# Patient Record
Sex: Female | Born: 1970 | Hispanic: Yes | Marital: Married | Smoking: Never smoker
Health system: Southern US, Community
[De-identification: ages and names within clinical notes are randomized; demographics above are authoritative.]

## PROBLEM LIST (undated history)

## (undated) DIAGNOSIS — M329 Systemic lupus erythematosus, unspecified: Secondary | ICD-10-CM

## (undated) DIAGNOSIS — R030 Elevated blood-pressure reading, without diagnosis of hypertension: Secondary | ICD-10-CM

## (undated) DIAGNOSIS — K589 Irritable bowel syndrome without diarrhea: Secondary | ICD-10-CM

## (undated) DIAGNOSIS — D72819 Decreased white blood cell count, unspecified: Secondary | ICD-10-CM

## (undated) DIAGNOSIS — G40909 Epilepsy, unspecified, not intractable, without status epilepticus: Secondary | ICD-10-CM

## (undated) DIAGNOSIS — G43509 Persistent migraine aura without cerebral infarction, not intractable, without status migrainosus: Secondary | ICD-10-CM

## (undated) DIAGNOSIS — IMO0001 Reserved for inherently not codable concepts without codable children: Secondary | ICD-10-CM

## (undated) DIAGNOSIS — E785 Hyperlipidemia, unspecified: Secondary | ICD-10-CM

## (undated) HISTORY — DX: Elevated blood-pressure reading, without diagnosis of hypertension: R03.0

## (undated) HISTORY — PX: CHOLECYSTECTOMY: SHX55

## (undated) HISTORY — PX: BREAST SURGERY: SHX581

## (undated) HISTORY — DX: Systemic lupus erythematosus, unspecified: M32.9

## (undated) HISTORY — PX: REDUCTION MAMMAPLASTY: SUR839

## (undated) HISTORY — PX: TONSILLECTOMY: SUR1361

## (undated) HISTORY — DX: Hyperlipidemia, unspecified: E78.5

## (undated) HISTORY — DX: Reserved for inherently not codable concepts without codable children: IMO0001

## (undated) HISTORY — DX: Decreased white blood cell count, unspecified: D72.819

## (undated) HISTORY — DX: Epilepsy, unspecified, not intractable, without status epilepticus: G40.909

## (undated) HISTORY — DX: Irritable bowel syndrome, unspecified: K58.9

## (undated) HISTORY — PX: VAGINAL HYSTERECTOMY: SUR661

## (undated) HISTORY — PX: DILATION AND CURETTAGE OF UTERUS: SHX78

## (undated) HISTORY — DX: Persistent migraine aura without cerebral infarction, not intractable, without status migrainosus: G43.509

---

## 2001-07-24 ENCOUNTER — Inpatient Hospital Stay (HOSPITAL_COMMUNITY): Admission: AD | Admit: 2001-07-24 | Discharge: 2001-07-24 | Payer: Self-pay | Admitting: *Deleted

## 2001-08-07 ENCOUNTER — Encounter (HOSPITAL_COMMUNITY): Admission: RE | Admit: 2001-08-07 | Discharge: 2001-09-06 | Payer: Self-pay | Admitting: *Deleted

## 2001-08-28 ENCOUNTER — Encounter: Payer: Self-pay | Admitting: *Deleted

## 2001-09-11 ENCOUNTER — Encounter (HOSPITAL_COMMUNITY): Admission: RE | Admit: 2001-09-11 | Discharge: 2001-09-13 | Payer: Self-pay | Admitting: *Deleted

## 2001-09-16 ENCOUNTER — Inpatient Hospital Stay (HOSPITAL_COMMUNITY): Admission: AD | Admit: 2001-09-16 | Discharge: 2001-09-18 | Payer: Self-pay | Admitting: *Deleted

## 2002-06-18 ENCOUNTER — Inpatient Hospital Stay (HOSPITAL_COMMUNITY): Admission: RE | Admit: 2002-06-18 | Discharge: 2002-06-19 | Payer: Self-pay | Admitting: Obstetrics and Gynecology

## 2003-07-28 ENCOUNTER — Inpatient Hospital Stay (HOSPITAL_COMMUNITY): Admission: AD | Admit: 2003-07-28 | Discharge: 2003-07-30 | Payer: Self-pay | Admitting: Gynecology

## 2003-09-12 ENCOUNTER — Other Ambulatory Visit: Admission: RE | Admit: 2003-09-12 | Discharge: 2003-09-12 | Payer: Self-pay | Admitting: Gynecology

## 2003-12-19 ENCOUNTER — Other Ambulatory Visit: Admission: RE | Admit: 2003-12-19 | Discharge: 2003-12-19 | Payer: Self-pay | Admitting: Gynecology

## 2004-05-12 ENCOUNTER — Encounter: Admission: RE | Admit: 2004-05-12 | Discharge: 2004-05-12 | Payer: Self-pay | Admitting: Gastroenterology

## 2004-05-20 ENCOUNTER — Encounter (INDEPENDENT_AMBULATORY_CARE_PROVIDER_SITE_OTHER): Payer: Self-pay | Admitting: *Deleted

## 2004-05-20 ENCOUNTER — Inpatient Hospital Stay (HOSPITAL_COMMUNITY): Admission: RE | Admit: 2004-05-20 | Discharge: 2004-05-21 | Payer: Self-pay | Admitting: Gynecology

## 2004-10-06 ENCOUNTER — Ambulatory Visit: Payer: Self-pay | Admitting: General Surgery

## 2004-10-12 ENCOUNTER — Ambulatory Visit: Payer: Self-pay | Admitting: General Surgery

## 2004-10-14 ENCOUNTER — Ambulatory Visit: Payer: Self-pay | Admitting: General Surgery

## 2005-02-07 ENCOUNTER — Other Ambulatory Visit: Admission: RE | Admit: 2005-02-07 | Discharge: 2005-02-07 | Payer: Self-pay | Admitting: Gynecology

## 2005-09-15 ENCOUNTER — Ambulatory Visit: Payer: Self-pay | Admitting: Family Medicine

## 2006-01-27 ENCOUNTER — Ambulatory Visit: Payer: Self-pay | Admitting: Internal Medicine

## 2006-02-10 ENCOUNTER — Other Ambulatory Visit: Admission: RE | Admit: 2006-02-10 | Discharge: 2006-02-10 | Payer: Self-pay | Admitting: Gynecology

## 2006-05-08 ENCOUNTER — Ambulatory Visit: Payer: Self-pay | Admitting: Internal Medicine

## 2006-05-29 ENCOUNTER — Ambulatory Visit: Payer: Self-pay | Admitting: Internal Medicine

## 2006-06-15 ENCOUNTER — Ambulatory Visit: Payer: Self-pay | Admitting: Internal Medicine

## 2006-06-28 ENCOUNTER — Ambulatory Visit (HOSPITAL_COMMUNITY): Admission: RE | Admit: 2006-06-28 | Discharge: 2006-06-28 | Payer: Self-pay | Admitting: Internal Medicine

## 2006-06-28 ENCOUNTER — Encounter: Payer: Self-pay | Admitting: Internal Medicine

## 2006-06-28 LAB — HM SIGMOIDOSCOPY

## 2006-06-29 ENCOUNTER — Ambulatory Visit: Payer: Self-pay | Admitting: Internal Medicine

## 2006-07-13 ENCOUNTER — Other Ambulatory Visit: Admission: RE | Admit: 2006-07-13 | Discharge: 2006-07-13 | Payer: Self-pay | Admitting: Gynecology

## 2006-07-17 ENCOUNTER — Encounter: Admission: RE | Admit: 2006-07-17 | Discharge: 2006-07-17 | Payer: Self-pay | Admitting: Gynecology

## 2006-07-24 ENCOUNTER — Ambulatory Visit: Payer: Self-pay | Admitting: Internal Medicine

## 2007-02-12 ENCOUNTER — Other Ambulatory Visit: Admission: RE | Admit: 2007-02-12 | Discharge: 2007-02-12 | Payer: Self-pay | Admitting: Gynecology

## 2007-02-20 ENCOUNTER — Ambulatory Visit: Payer: Self-pay | Admitting: Internal Medicine

## 2007-03-01 ENCOUNTER — Ambulatory Visit: Payer: Self-pay | Admitting: Internal Medicine

## 2007-03-08 DIAGNOSIS — K589 Irritable bowel syndrome without diarrhea: Secondary | ICD-10-CM

## 2007-04-05 ENCOUNTER — Ambulatory Visit: Payer: Self-pay | Admitting: Internal Medicine

## 2007-05-06 ENCOUNTER — Emergency Department (HOSPITAL_COMMUNITY): Admission: EM | Admit: 2007-05-06 | Discharge: 2007-05-06 | Payer: Self-pay | Admitting: Emergency Medicine

## 2007-05-22 ENCOUNTER — Ambulatory Visit: Payer: Self-pay | Admitting: Internal Medicine

## 2007-05-22 LAB — CONVERTED CEMR LAB
Albumin: 3.9 g/dL (ref 3.5–5.2)
Basophils Relative: 0.2 % (ref 0.0–1.0)
Hemoglobin: 12.8 g/dL (ref 12.0–15.0)
Lymphocytes Relative: 29.7 % (ref 12.0–46.0)
MCV: 92.9 fL (ref 78.0–100.0)
Monocytes Absolute: 0.4 10*3/uL (ref 0.2–0.7)
Monocytes Relative: 10.4 % (ref 3.0–11.0)
Neutro Abs: 2.6 10*3/uL (ref 1.4–7.7)
Total Bilirubin: 0.5 mg/dL (ref 0.3–1.2)
Total Protein: 7 g/dL (ref 6.0–8.3)

## 2007-05-23 ENCOUNTER — Ambulatory Visit (HOSPITAL_COMMUNITY): Admission: RE | Admit: 2007-05-23 | Discharge: 2007-05-23 | Payer: Self-pay | Admitting: Internal Medicine

## 2007-07-09 ENCOUNTER — Ambulatory Visit: Payer: Self-pay | Admitting: Internal Medicine

## 2007-07-09 LAB — CONVERTED CEMR LAB
Eosinophils Relative: 0.2 % (ref 0.0–5.0)
HCT: 37.4 % (ref 36.0–46.0)
MCV: 93.5 fL (ref 78.0–100.0)
Neutrophils Relative %: 64.9 % (ref 43.0–77.0)
RBC: 4 M/uL (ref 3.87–5.11)
RDW: 12.4 % (ref 11.5–14.6)
WBC: 3.9 10*3/uL — ABNORMAL LOW (ref 4.5–10.5)

## 2007-07-12 ENCOUNTER — Ambulatory Visit: Payer: Self-pay | Admitting: Oncology

## 2007-07-31 ENCOUNTER — Encounter: Payer: Self-pay | Admitting: Internal Medicine

## 2007-07-31 LAB — COMPREHENSIVE METABOLIC PANEL
ALT: 38 U/L — ABNORMAL HIGH (ref 0–35)
AST: 24 U/L (ref 0–37)
Albumin: 4.2 g/dL (ref 3.5–5.2)
Alkaline Phosphatase: 44 U/L (ref 39–117)
BUN: 13 mg/dL (ref 6–23)
CO2: 23 mEq/L (ref 19–32)
Calcium: 8.8 mg/dL (ref 8.4–10.5)
Chloride: 107 mEq/L (ref 96–112)
Creatinine, Ser: 0.57 mg/dL (ref 0.40–1.20)
Glucose, Bld: 80 mg/dL (ref 70–99)
Potassium: 4.1 mEq/L (ref 3.5–5.3)
Sodium: 141 mEq/L (ref 135–145)
Total Bilirubin: 0.4 mg/dL (ref 0.3–1.2)
Total Protein: 6.8 g/dL (ref 6.0–8.3)

## 2007-07-31 LAB — CBC WITH DIFFERENTIAL/PLATELET
Basophils Absolute: 0 10*3/uL (ref 0.0–0.1)
Eosinophils Absolute: 0 10*3/uL (ref 0.0–0.5)
HCT: 35 % (ref 34.8–46.6)
HGB: 12.3 g/dL (ref 11.6–15.9)
MCH: 32.5 pg (ref 26.0–34.0)
MCV: 92.2 fL (ref 81.0–101.0)
MONO%: 9.2 % (ref 0.0–13.0)
NEUT#: 2.3 10*3/uL (ref 1.5–6.5)
NEUT%: 66.2 % (ref 39.6–76.8)
RDW: 12.8 % (ref 11.3–14.5)
lymph#: 0.8 10*3/uL — ABNORMAL LOW (ref 0.9–3.3)

## 2007-07-31 LAB — PROTHROMBIN TIME: Prothrombin Time: 13 seconds (ref 11.6–15.2)

## 2007-07-31 LAB — ERYTHROCYTE SEDIMENTATION RATE: Sed Rate: 9 mm/hr (ref 0–30)

## 2007-07-31 LAB — APTT: aPTT: 28 seconds (ref 24–37)

## 2007-08-01 LAB — HIV ANTIBODY (ROUTINE TESTING W REFLEX)

## 2007-08-01 LAB — HEPATITIS C ANTIBODY: HCV Ab: NEGATIVE

## 2007-08-01 LAB — ANA: Anti Nuclear Antibody(ANA): NEGATIVE

## 2007-08-01 LAB — HEPATITIS B SURFACE ANTIBODY,QUALITATIVE: Hep B S Ab: NEGATIVE

## 2007-08-24 ENCOUNTER — Encounter: Payer: Self-pay | Admitting: Internal Medicine

## 2008-01-14 ENCOUNTER — Ambulatory Visit: Payer: Self-pay | Admitting: Oncology

## 2008-01-16 ENCOUNTER — Encounter: Payer: Self-pay | Admitting: Internal Medicine

## 2008-01-16 LAB — CBC WITH DIFFERENTIAL/PLATELET
BASO%: 1.1 % (ref 0.0–2.0)
Basophils Absolute: 0 10*3/uL (ref 0.0–0.1)
EOS%: 0.6 % (ref 0.0–7.0)
HCT: 38.9 % (ref 34.8–46.6)
HGB: 13.6 g/dL (ref 11.6–15.9)
MCH: 32.4 pg (ref 26.0–34.0)
MCHC: 34.9 g/dL (ref 32.0–36.0)
MCV: 92.8 fL (ref 81.0–101.0)
MONO%: 7.7 % (ref 0.0–13.0)
NEUT%: 60.6 % (ref 39.6–76.8)
lymph#: 1.2 10*3/uL (ref 0.9–3.3)

## 2008-01-16 LAB — COMPREHENSIVE METABOLIC PANEL
AST: 22 U/L (ref 0–37)
Albumin: 4.6 g/dL (ref 3.5–5.2)
Alkaline Phosphatase: 47 U/L (ref 39–117)
BUN: 14 mg/dL (ref 6–23)
Creatinine, Ser: 0.67 mg/dL (ref 0.40–1.20)
Glucose, Bld: 82 mg/dL (ref 70–99)
Potassium: 3.9 mEq/L (ref 3.5–5.3)

## 2008-01-24 DIAGNOSIS — D72819 Decreased white blood cell count, unspecified: Secondary | ICD-10-CM | POA: Insufficient documentation

## 2008-01-24 DIAGNOSIS — D696 Thrombocytopenia, unspecified: Secondary | ICD-10-CM | POA: Insufficient documentation

## 2008-01-24 DIAGNOSIS — E7800 Pure hypercholesterolemia, unspecified: Secondary | ICD-10-CM

## 2008-03-17 ENCOUNTER — Other Ambulatory Visit: Admission: RE | Admit: 2008-03-17 | Discharge: 2008-03-17 | Payer: Self-pay | Admitting: Gynecology

## 2008-03-21 ENCOUNTER — Encounter: Admission: RE | Admit: 2008-03-21 | Discharge: 2008-03-21 | Payer: Self-pay | Admitting: Gynecology

## 2008-06-15 ENCOUNTER — Ambulatory Visit: Payer: Self-pay | Admitting: Oncology

## 2008-06-18 ENCOUNTER — Ambulatory Visit: Payer: Self-pay | Admitting: Internal Medicine

## 2008-06-20 ENCOUNTER — Encounter: Payer: Self-pay | Admitting: Internal Medicine

## 2008-06-20 LAB — CBC WITH DIFFERENTIAL/PLATELET
BASO%: 0.4 % (ref 0.0–2.0)
EOS%: 0.6 % (ref 0.0–7.0)
MCH: 32.2 pg (ref 26.0–34.0)
MCHC: 34.1 g/dL (ref 32.0–36.0)
MONO#: 0.3 10*3/uL (ref 0.1–0.9)
RBC: 4.23 10*6/uL (ref 3.70–5.32)
RDW: 12.1 % (ref 11.3–14.5)
WBC: 3.9 10*3/uL (ref 3.9–10.0)
lymph#: 1.1 10*3/uL (ref 0.9–3.3)

## 2008-06-20 LAB — COMPREHENSIVE METABOLIC PANEL
ALT: 20 U/L (ref 0–35)
AST: 23 U/L (ref 0–37)
CO2: 24 mEq/L (ref 19–32)
Calcium: 9.3 mg/dL (ref 8.4–10.5)
Chloride: 103 mEq/L (ref 96–112)
Sodium: 138 mEq/L (ref 135–145)
Total Bilirubin: 0.6 mg/dL (ref 0.3–1.2)
Total Protein: 7.4 g/dL (ref 6.0–8.3)

## 2008-06-20 LAB — LACTATE DEHYDROGENASE: LDH: 180 U/L (ref 94–250)

## 2008-07-15 IMAGING — CR DG ABDOMEN ACUTE W/ 1V CHEST
4 series · 4 of 4 positions shown · non-contrast
Comparison: none

CLINICAL DATA: Epigastric pain.  Nausea, vomiting and syncopal episode.   
 ACUTE ABDOMINAL SERIES WITH CHEST - 3 VIEW:

[w chest pa]
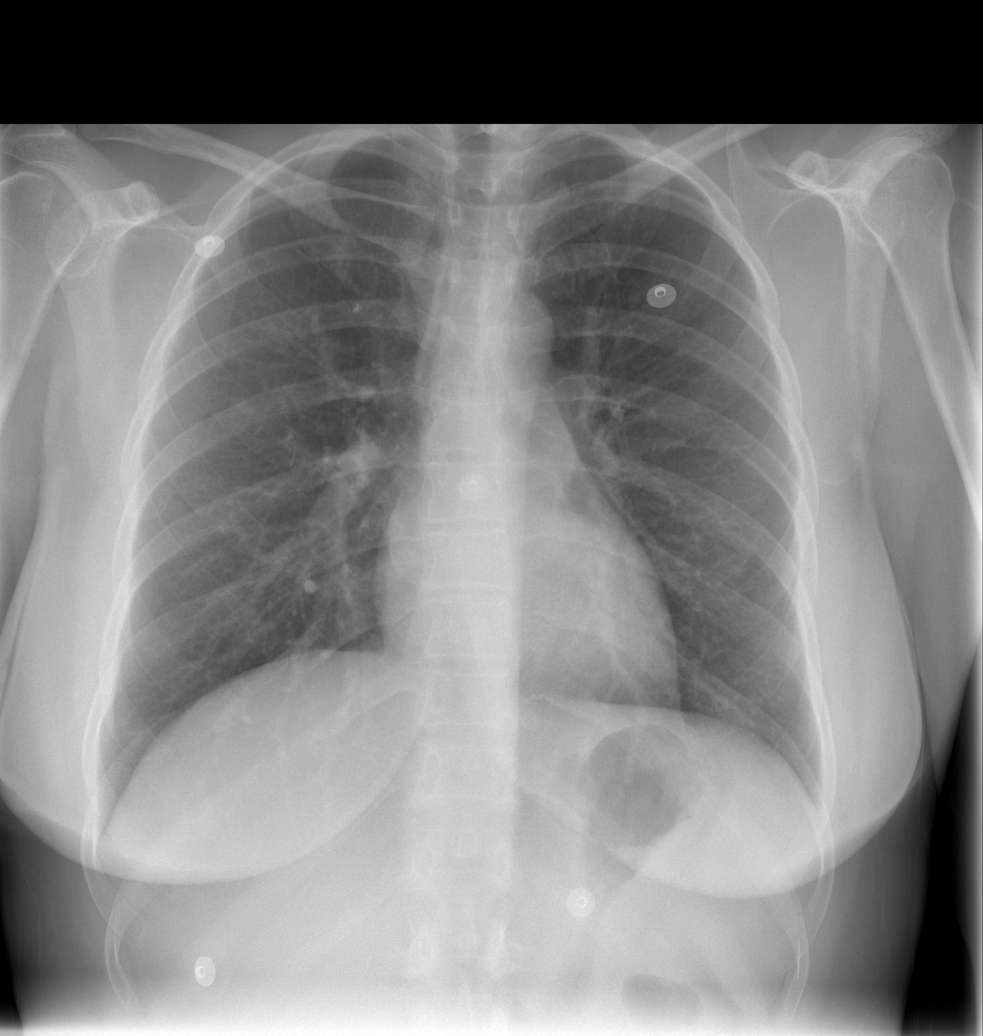

[w abdomen upright *]
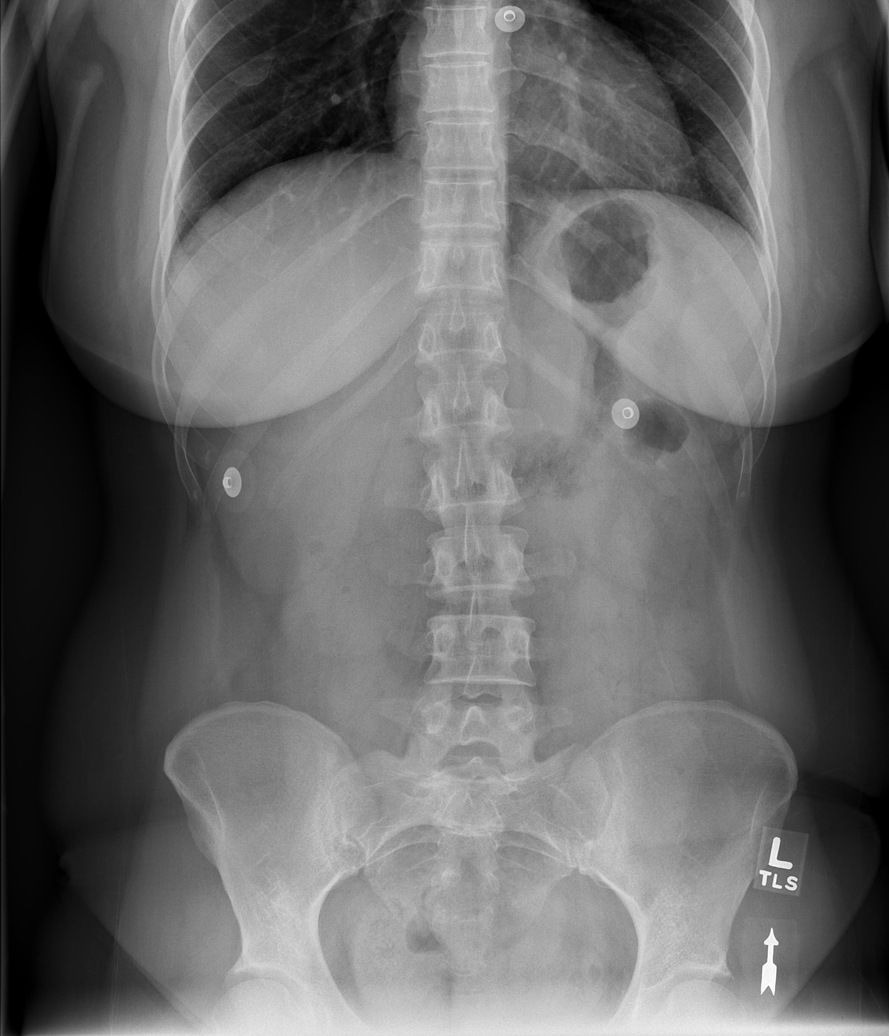

[t abdomen supine (1 of 2)]
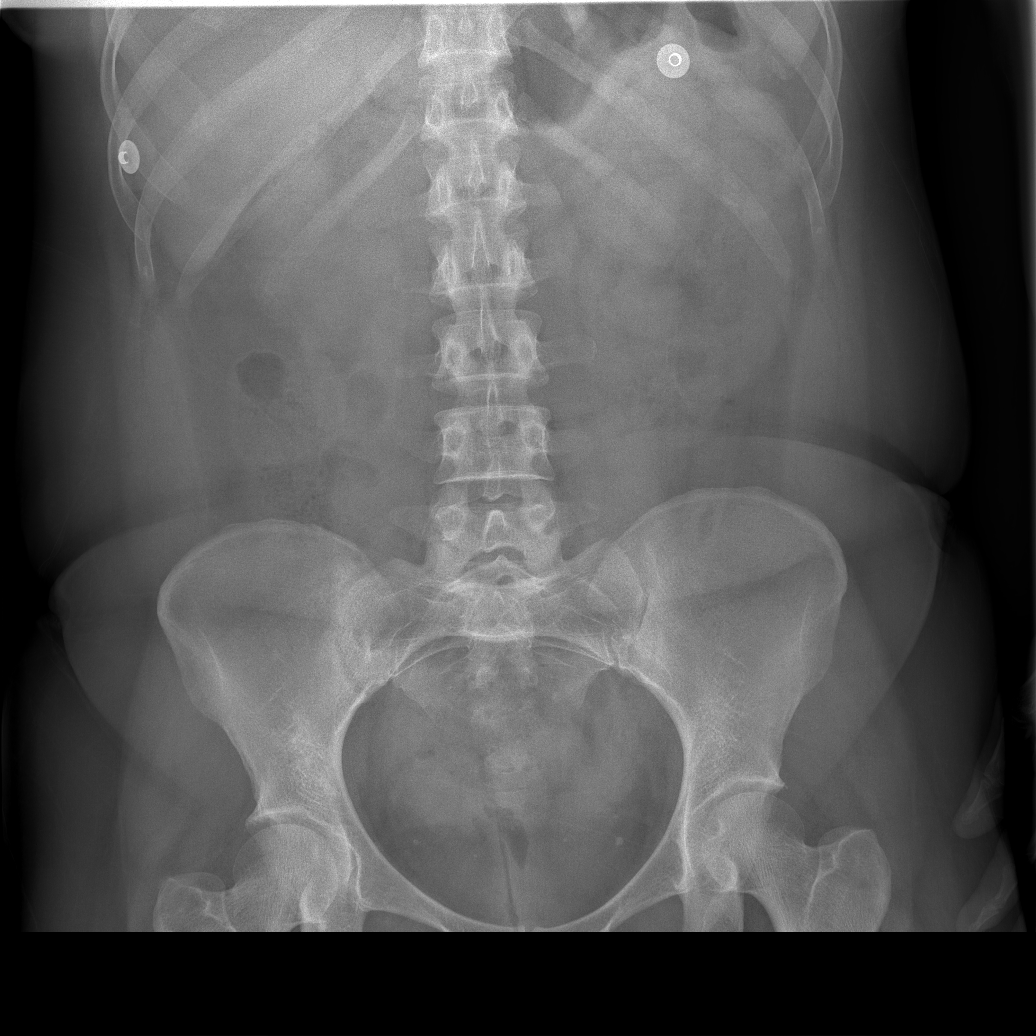

[t abdomen supine (2 of 2)]
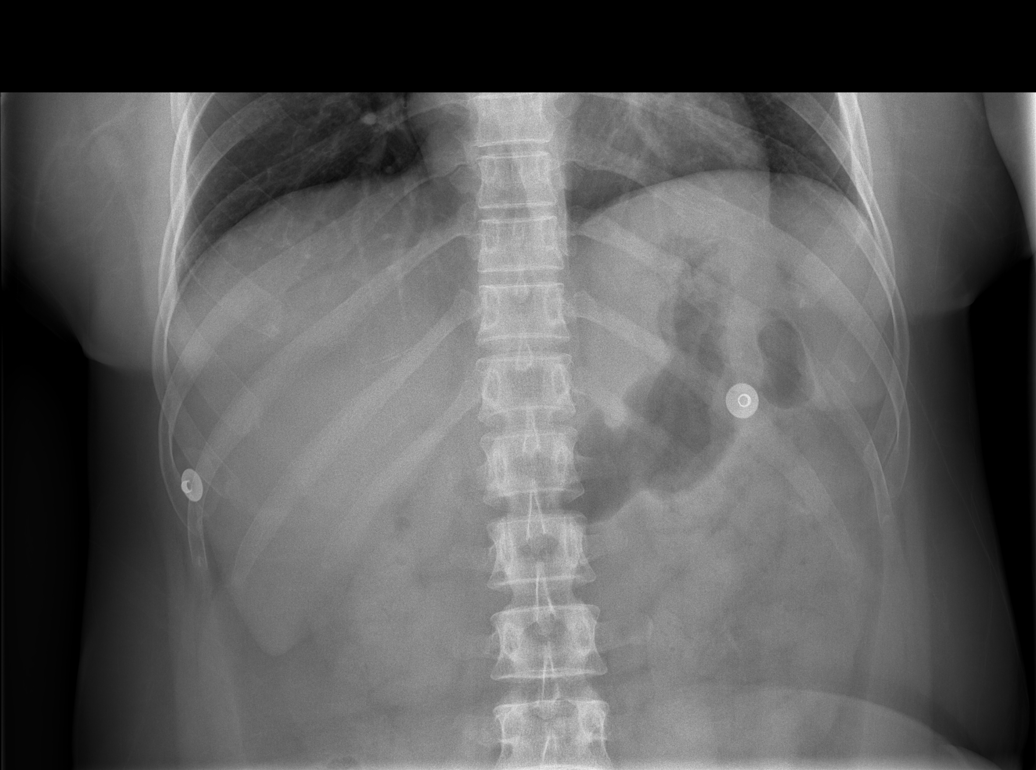

[4 of 4 positions shown; findings below may reference images not displayed]

FINDINGS: Heart and lungs are normal.  There is no free-air or free fluid in the abdomen.  The bowel gas pattern is normal.  No bony abnormality.  There are some phleboliths in the pelvis.
IMPRESSION: Benign-appearing abdomen and pelvis.

## 2008-12-22 ENCOUNTER — Ambulatory Visit: Payer: Self-pay | Admitting: Oncology

## 2008-12-24 ENCOUNTER — Encounter: Payer: Self-pay | Admitting: Internal Medicine

## 2008-12-24 LAB — CBC WITH DIFFERENTIAL/PLATELET
BASO%: 0.3 % (ref 0.0–2.0)
EOS%: 0.2 % (ref 0.0–7.0)
LYMPH%: 24.6 % (ref 14.0–49.7)
MCH: 32.4 pg (ref 25.1–34.0)
MCHC: 34.4 g/dL (ref 31.5–36.0)
MCV: 94.2 fL (ref 79.5–101.0)
MONO#: 0.3 10*3/uL (ref 0.1–0.9)
MONO%: 8.2 % (ref 0.0–14.0)
Platelets: 122 10*3/uL — ABNORMAL LOW (ref 145–400)
RBC: 3.96 10*6/uL (ref 3.70–5.45)
WBC: 4 10*3/uL (ref 3.9–10.3)

## 2009-01-23 ENCOUNTER — Ambulatory Visit: Payer: Self-pay | Admitting: Internal Medicine

## 2009-01-23 LAB — CONVERTED CEMR LAB
ALT: 20 units/L (ref 0–35)
AST: 20 units/L (ref 0–37)
Albumin: 3.8 g/dL (ref 3.5–5.2)
Alkaline Phosphatase: 51 units/L (ref 39–117)
Total Protein: 7 g/dL (ref 6.0–8.3)

## 2009-01-26 ENCOUNTER — Ambulatory Visit: Payer: Self-pay | Admitting: Internal Medicine

## 2009-01-26 DIAGNOSIS — E669 Obesity, unspecified: Secondary | ICD-10-CM

## 2009-02-09 ENCOUNTER — Telehealth: Payer: Self-pay | Admitting: Internal Medicine

## 2009-03-16 ENCOUNTER — Ambulatory Visit: Payer: Self-pay | Admitting: Internal Medicine

## 2009-03-20 ENCOUNTER — Encounter: Payer: Self-pay | Admitting: Gynecology

## 2009-03-20 ENCOUNTER — Ambulatory Visit: Payer: Self-pay | Admitting: Gynecology

## 2009-03-20 ENCOUNTER — Other Ambulatory Visit: Admission: RE | Admit: 2009-03-20 | Discharge: 2009-03-20 | Payer: Self-pay | Admitting: Gynecology

## 2009-06-19 ENCOUNTER — Ambulatory Visit: Payer: Self-pay | Admitting: Oncology

## 2009-06-23 ENCOUNTER — Encounter: Payer: Self-pay | Admitting: Internal Medicine

## 2009-06-23 LAB — COMPREHENSIVE METABOLIC PANEL
ALT: 13 U/L (ref 0–35)
AST: 14 U/L (ref 0–37)
CO2: 25 mEq/L (ref 19–32)
Sodium: 138 mEq/L (ref 135–145)
Total Bilirubin: 0.3 mg/dL (ref 0.3–1.2)
Total Protein: 7.1 g/dL (ref 6.0–8.3)

## 2009-06-23 LAB — CBC WITH DIFFERENTIAL/PLATELET
Eosinophils Absolute: 0 10*3/uL (ref 0.0–0.5)
LYMPH%: 29.6 % (ref 14.0–49.7)
MONO#: 0.4 10*3/uL (ref 0.1–0.9)
NEUT#: 2.7 10*3/uL (ref 1.5–6.5)
Platelets: 131 10*3/uL — ABNORMAL LOW (ref 145–400)
RBC: 4.04 10*6/uL (ref 3.70–5.45)
WBC: 4.4 10*3/uL (ref 3.9–10.3)
lymph#: 1.3 10*3/uL (ref 0.9–3.3)

## 2009-08-31 ENCOUNTER — Ambulatory Visit: Payer: Self-pay | Admitting: Internal Medicine

## 2009-08-31 DIAGNOSIS — G8929 Other chronic pain: Secondary | ICD-10-CM | POA: Insufficient documentation

## 2009-08-31 DIAGNOSIS — R51 Headache: Secondary | ICD-10-CM

## 2009-09-08 ENCOUNTER — Telehealth: Payer: Self-pay | Admitting: Internal Medicine

## 2009-09-14 ENCOUNTER — Encounter (INDEPENDENT_AMBULATORY_CARE_PROVIDER_SITE_OTHER): Payer: Self-pay | Admitting: *Deleted

## 2009-10-22 ENCOUNTER — Encounter: Payer: Self-pay | Admitting: Internal Medicine

## 2009-12-18 ENCOUNTER — Telehealth (INDEPENDENT_AMBULATORY_CARE_PROVIDER_SITE_OTHER): Payer: Self-pay | Admitting: *Deleted

## 2009-12-18 ENCOUNTER — Ambulatory Visit: Payer: Self-pay | Admitting: Internal Medicine

## 2009-12-18 DIAGNOSIS — R030 Elevated blood-pressure reading, without diagnosis of hypertension: Secondary | ICD-10-CM

## 2009-12-21 ENCOUNTER — Telehealth (INDEPENDENT_AMBULATORY_CARE_PROVIDER_SITE_OTHER): Payer: Self-pay | Admitting: *Deleted

## 2009-12-22 ENCOUNTER — Ambulatory Visit: Payer: Self-pay | Admitting: Internal Medicine

## 2009-12-22 ENCOUNTER — Ambulatory Visit: Payer: Self-pay | Admitting: Oncology

## 2009-12-23 LAB — CONVERTED CEMR LAB
BUN: 10 mg/dL (ref 6–23)
Creatinine, Ser: 0.7 mg/dL (ref 0.4–1.2)
GFR calc non Af Amer: 99.85 mL/min (ref >60)
TSH: 1.47 microintl units/mL (ref 0.35–5.50)

## 2009-12-24 ENCOUNTER — Encounter: Payer: Self-pay | Admitting: Internal Medicine

## 2009-12-24 LAB — COMPREHENSIVE METABOLIC PANEL
ALT: 14 U/L (ref 0–35)
AST: 15 U/L (ref 0–37)
Alkaline Phosphatase: 51 U/L (ref 39–117)
BUN: 14 mg/dL (ref 6–23)
Calcium: 9.6 mg/dL (ref 8.4–10.5)
Chloride: 102 mEq/L (ref 96–112)
Creatinine, Ser: 0.7 mg/dL (ref 0.40–1.20)

## 2009-12-24 LAB — CBC WITH DIFFERENTIAL/PLATELET
BASO%: 0.2 % (ref 0.0–2.0)
EOS%: 0.6 % (ref 0.0–7.0)
MCH: 32.3 pg (ref 25.1–34.0)
MCHC: 34.3 g/dL (ref 31.5–36.0)
MCV: 94.3 fL (ref 79.5–101.0)
MONO%: 10.5 % (ref 0.0–14.0)
NEUT%: 63.3 % (ref 38.4–76.8)
RDW: 12.4 % (ref 11.2–14.5)
lymph#: 1 10*3/uL (ref 0.9–3.3)

## 2010-03-22 ENCOUNTER — Ambulatory Visit: Payer: Self-pay | Admitting: Gynecology

## 2010-03-22 ENCOUNTER — Other Ambulatory Visit: Admission: RE | Admit: 2010-03-22 | Discharge: 2010-03-22 | Payer: Self-pay | Admitting: Gynecology

## 2010-04-16 ENCOUNTER — Ambulatory Visit: Payer: Self-pay | Admitting: Internal Medicine

## 2010-05-11 ENCOUNTER — Telehealth (INDEPENDENT_AMBULATORY_CARE_PROVIDER_SITE_OTHER): Payer: Self-pay | Admitting: *Deleted

## 2010-05-11 ENCOUNTER — Ambulatory Visit: Payer: Self-pay | Admitting: Internal Medicine

## 2010-06-17 ENCOUNTER — Telehealth: Payer: Self-pay | Admitting: Internal Medicine

## 2010-06-23 ENCOUNTER — Ambulatory Visit: Payer: Self-pay | Admitting: Internal Medicine

## 2010-07-14 ENCOUNTER — Encounter: Payer: Self-pay | Admitting: Internal Medicine

## 2010-08-03 ENCOUNTER — Ambulatory Visit: Payer: Self-pay | Admitting: Internal Medicine

## 2010-10-14 ENCOUNTER — Encounter: Payer: Self-pay | Admitting: Internal Medicine

## 2010-11-15 ENCOUNTER — Ambulatory Visit
Admission: RE | Admit: 2010-11-15 | Discharge: 2010-11-15 | Payer: Self-pay | Source: Home / Self Care | Attending: Gynecology | Admitting: Gynecology

## 2010-11-18 ENCOUNTER — Encounter
Admission: RE | Admit: 2010-11-18 | Discharge: 2010-11-18 | Payer: Self-pay | Source: Home / Self Care | Attending: Gynecology | Admitting: Gynecology

## 2010-11-21 ENCOUNTER — Encounter: Payer: Self-pay | Admitting: Internal Medicine

## 2010-11-23 ENCOUNTER — Other Ambulatory Visit: Payer: Self-pay | Admitting: Internal Medicine

## 2010-11-23 ENCOUNTER — Ambulatory Visit
Admission: RE | Admit: 2010-11-23 | Discharge: 2010-11-23 | Payer: Self-pay | Source: Home / Self Care | Attending: Internal Medicine | Admitting: Internal Medicine

## 2010-11-23 DIAGNOSIS — R42 Dizziness and giddiness: Secondary | ICD-10-CM | POA: Insufficient documentation

## 2010-11-23 DIAGNOSIS — J029 Acute pharyngitis, unspecified: Secondary | ICD-10-CM | POA: Insufficient documentation

## 2010-11-23 DIAGNOSIS — R5381 Other malaise: Secondary | ICD-10-CM | POA: Insufficient documentation

## 2010-11-23 DIAGNOSIS — R5383 Other fatigue: Secondary | ICD-10-CM

## 2010-11-23 LAB — HEPATIC FUNCTION PANEL
Albumin: 4.3 g/dL (ref 3.5–5.2)
Alkaline Phosphatase: 49 U/L (ref 39–117)
Bilirubin, Direct: 0 mg/dL (ref 0.0–0.3)
Total Bilirubin: 0.6 mg/dL (ref 0.3–1.2)

## 2010-11-23 LAB — CBC WITH DIFFERENTIAL/PLATELET
Basophils Absolute: 0 10*3/uL (ref 0.0–0.1)
Lymphocytes Relative: 16 % (ref 12.0–46.0)
Monocytes Relative: 5.8 % (ref 3.0–12.0)
Neutrophils Relative %: 77.9 % — ABNORMAL HIGH (ref 43.0–77.0)
Platelets: 113 10*3/uL — ABNORMAL LOW (ref 150.0–400.0)
RDW: 12.9 % (ref 11.5–14.6)

## 2010-11-23 LAB — BASIC METABOLIC PANEL
BUN: 16 mg/dL (ref 6–23)
Calcium: 9.4 mg/dL (ref 8.4–10.5)
GFR: 94.66 mL/min (ref >60.00)
Glucose, Bld: 76 mg/dL (ref 70–99)

## 2010-11-23 LAB — SEDIMENTATION RATE: Sed Rate: 41 mm/hr — ABNORMAL HIGH (ref 0–22)

## 2010-11-23 LAB — MONONUCLEOSIS SCREEN: Mono Screen: NEGATIVE

## 2010-11-23 LAB — CONVERTED CEMR LAB: Rapid Strep: NEGATIVE

## 2010-11-30 NOTE — Progress Notes (Signed)
Summary: needs ov  Phone Note Outgoing Call Call back at [facility] Phone [phone] Call back at Work Phone [phone]   Summary of Call: Patient was a walk in friday for elevated bp.  Patient was unable to wait for ov.  Please call patient & schedule office visit for this week with Dr. Alanson Aly [facility]  December 21, 2009 8:25 AM     Additional Follow-up for Phone Call Additional follow up Details #2::    PATIENT HAS A APPT ON fEB 22,2011 Follow-up by: Barb Merino,  December 21, 2009 8:41 AM

## 2010-11-30 NOTE — Assessment & Plan Note (Signed)
Summary: ACUTE WALKIN--ADD PER ALIDA///[facility]   Vital Signs:  Patient profile:   40 year old female Height:      57 inches Weight:      147.38 pounds BMI:     32.01 Pulse rate:   84 / minute BP sitting:   130 / 90  Vitals Entered By: Kandice Hams (December 18, 2009 1:00 PM) CC: WALK IN -- pt c/o elevated [facility]101 started feeling  dizzy, [facility] Comments pt left office says felt much better. Could not wait any longer.   History of Present Illness: patient walked in left w/o see me EKG sinus tachy we just had 3 acute patients one needed to be sent  to the ER, we are working in the 2nd possible admission will contact her to  re-schedule [place] E. Kielan Dreisbach MD  December 18, 2009 2:12 PM   Allergies: No Known Drug Allergies   Complete Medication List: 1)  Ibuprofen 600 Mg Tabs (Ibuprofen) .... One by mouth every 6 hours as needed for headache  Other Orders: EKG w/ Interpretation (93000) No Charge Patient Arrived (NCPA0) (NCPA0)

## 2010-11-30 NOTE — Progress Notes (Signed)
Summary: abdominal x-ray of the sinuses   Phone Note From Other Clinic   Summary of Call: Dr Fredrik Cove,  a local dentist, called He found a rounded opacity in the right sinus cavity, he is concerned about that, things he needs a CT of further E. valve Plan: re-schedule CT of the sinuses refer to ENT, DX abnormal x-ray of the sinus  Jose E. Paz MD  June 17, 2010 12:00 PM   Follow-up for Phone Call        Home number temporaily unavailable, left message on work number. Army Fossa CMA  June 17, 2010 12:50 PM  [email or web address] Barban  June 18, 2010 12:56 PM followup on this  next week please Jose E. Paz MD  June 18, 2010 2:10 PM   Additional Follow-up for Phone Call Additional follow up Details #1::        I spoke with pt and she is aware of the ENT referral and CT of sinuses.  Additional Follow-up by: Army Fossa CMA,  June 21, 2010 1:23 PM

## 2010-11-30 NOTE — Assessment & Plan Note (Signed)
Summary: TB TEST///[facility]   Nurse Visit  CC: TB skin test./kb   Allergies: 1)  ! Amoxicillin  Immunizations Administered:  PPD Skin Test:    Vaccine Type: PPD    Site: left forearm    Dose: 0.1 ml    Route: ID    Given by: Lucious Groves CMA    Exp. Date: 09/02/2011    Lot #: [id number]  PPD Results    Date of reading: 08/05/2010    Results: < 5mm    Interpretation: negative  Orders Added: 1)  TB Skin Test [86580] 2)  Admin 1st Vaccine [91478]

## 2010-11-30 NOTE — Consult Note (Signed)
Summary: DX migraine w/ aura, no need for w/u, f/u prn=== Neurologic  [facility]   Imported By: Lanelle Bal 10/30/2009 11:00:43  _____________________________________________________________________  External Attachment:    Type:   Image     Comment:   External Document

## 2010-11-30 NOTE — Assessment & Plan Note (Signed)
Summary: still congested/kn   Vital Signs:  Patient profile:   40 year old female Weight:      146.13 pounds Temp:     99.0 degrees F oral Pulse rate:   113 / minute Pulse rhythm:   regular BP sitting:   122 / 89  (left arm) Cuff size:   regular  Vitals Entered By: Army Fossa CMA (May 11, 2010 11:32 AM) CC: Still congested, no relief. Pressure in ears and head.   History of Present Illness: since her last visit 6-17 she is no better Her main complaint continued to be "pressure" located at the ears, maxillary and frontal sinuses. Headache? Occasionally has actual headache away from the sinus area.  Review of systems No fever No cough Sneezing for the last couple of days with very mild nasal discharge Still feeling imbalance from time to time  Allergies (verified): No Known Drug Allergies  Past History:  Past Medical History: Reviewed history from 04/16/2010 and no changes required. elevated BP without diagnosis of hypertension  2-11 DYSLIPIDEMIA  ABDOMINAL PAIN, CHRONIC -- sees GI IBS LEUKOCYTOPENIA - thrombocytopenia UNSPECIFIED -- sees hematology , Rx observation  IBS   Migraine HAs w/ aura , saw neuro 12-10  Past Surgical History: Reviewed history from 06/18/2008 and no changes required. Hysterectomy, NO oophorectomy Cholecystectomy  Social History: Reviewed history from 12/22/2009 and no changes required. 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  Alcohol Use --- rarely tobacco--no Daily Caffeine Use -2 Illicit Drug Use - no  Review of Systems      See HPI  Physical Exam  General:  alert, well-developed, and well-nourished.   Head:  face symmetric, not tender to palpation Ears:  slightly bulge TMs, no red, no discharge, canal normalR ear normal, L ear normal, and no external deformities.   Nose:  slightly congested Mouth:  no redness or discharge, no postnasal dripping noted Lungs:  normal respiratory  effort, no intercostal retractions, no accessory muscle use, and normal breath sounds.   Neurologic:  speech, gait and motor is intact   Impression & Recommendations:  Problem # 1:  ? of SINUSITIS (ICD-473.9)  I suspect her symptoms are due to sinusitis (ethmoidal?) less likely the pain/pressure due to migraines plan: CT Antibiotics Second round of steroids Her updated medication list for this problem includes:    Nasonex 50 Mcg/act Susp (Mometasone furoate) .Marland Kitchen... 2 estrace on each side of the nose every day    Amoxicillin 500 Mg Tabs (Amoxicillin) .Marland Kitchen... 2 by mouth twice a day  Orders: Radiology Referral (Radiology)  Complete Medication List: 1)  Nasonex 50 Mcg/act Susp (Mometasone furoate) .... 2 estrace on each side of the nose every day 2)  Amoxicillin 500 Mg Tabs (Amoxicillin) .... 2 by mouth twice a day 3)  Prednisone 10 Mg Tabs (Prednisone) .... 5 tablets for 2 days,, 4x2,3x2,2x2,1x2  Patient Instructions: 1)  continue with  Nasonex and Claritin 2)  Prednisone and amoxicillin as prescribed 3)  CT of the sinuses 4)  Come back in 2 weeks Prescriptions: PREDNISONE 10 MG TABS (PREDNISONE) 5 tablets for 2 days,, 4x2,3x2,2x2,1x2  #32 x 0   Entered and Authorized by:   Nolon Rod. Emari Demmer MD   Signed by:   Nolon Rod. Rafi Kenneth MD on 05/11/2010   Method used:   Electronically to        [facility] [address] [place] (retail)       [phone]       [place]  [place], [place]  [address]       Ph: [phone]       Fax: [phone]   RxID:   [id number] AMOXICILLIN 500 MG TABS (AMOXICILLIN) 2 by mouth twice a day  #40 x 0   Entered and Authorized by:   Nolon Rod. Geraldina Parrott MD   Signed by:   Nolon Rod. Jonmichael Beadnell MD on 05/11/2010   Method used:   Electronically to        [facility] [address] [place] (retail)       [phone]       [place], [place]  [address]       Ph: [phone]       Fax: [phone]   RxID:   [id number]

## 2010-11-30 NOTE — Progress Notes (Signed)
Summary: PHONE-BP HIGH  Phone Note Call from Patient   Caller: Patient Summary of Call: PATIENT IS HERE. PATIENT WENT TO THE PHARMACY AND CHECKED HER BP AND BLOOD PRESSURE IS HIGH 150/101. PLEASE ADVISE Initial call taken by: Barb Merino,  December 18, 2009 12:28 PM  Follow-up for Phone Call        ov Follow-up by: Kandice Hams,  December 18, 2009 1:00 PM

## 2010-11-30 NOTE — Progress Notes (Signed)
Summary: allergic reaction needs med change  Phone Note Call from Patient Call back at Home Phone [phone]   Caller: Patient Details for Reason: ALLERGIC REACTION TO MED Summary of Call: Patient called says she took 2 pills of amoxicillin and developed redness in face and eyes along w/ rash feels like she is burning, doesn't have any swelling or SOB. Instructed patient to take some benadryl and not to take anymore of medication will forward to Dr. Drue Novel to change antibiotic . Initial call taken by: Doristine Devoid CMA,  May 11, 2010 4:17 PM  Follow-up for Phone Call        switch to Bactrim DS one p.o. b.i.d. for 10 days.  while on Bactrim, she needs to avoid excessive sun exposure Jose E. Paz MD  May 11, 2010 4:32 PM   spoke w/ patient aware of change in medication informed to no more amoxicillin and call if any concerns..........Marland KitchenDoristine Devoid CMA  May 11, 2010 4:40 PM    New Allergies: ! AMOXICILLIN New/Updated Medications: BACTRIM DS 800-160 MG TABS (SULFAMETHOXAZOLE-TRIMETHOPRIM) take one tablet two times a day x10 days New Allergies: ! AMOXICILLINPrescriptions: BACTRIM DS 800-160 MG TABS (SULFAMETHOXAZOLE-TRIMETHOPRIM) take one tablet two times a day x10 days  #20 x 0   Entered by:   Doristine Devoid CMA   Authorized by:   Nolon Rod. Paz MD   Signed by:   Doristine Devoid CMA on 05/11/2010   Method used:   Electronically to        [facility] [address] [place] (retail)       [phone]       [place], [place]  [address]       Ph: [phone]       Fax: [phone]   RxID:   [id number]

## 2010-11-30 NOTE — Assessment & Plan Note (Signed)
Summary: congested/cbs   Vital Signs:  Patient profile:   40 year old female Height:      57 inches Weight:      144 pounds Temp:     98.3 degrees F oral Pulse rate:   90 / minute BP sitting:   90 / 62  (left arm)  Vitals Entered By: Jeremy Johann CMA (April 16, 2010 8:11 AM) CC: pressure in earsx48month Comments REVIEWED MED LIST, PATIENT AGREED DOSE AND INSTRUCTION CORRECT    History of Present Illness: complains of pressure in both ears for a few months Also she feels dizzy, "imbalance" sometimes  Just saw her gynecologist, she finished a 10 day of antibiotics for possible sinusitis.  ROS No fever No cough No recent URI, runny nose, sore throat. Some postnasal dripping She does have itchy eyes and nose She has a history of migraine headaches but that has been inactive for a while  Allergies (verified): No Known Drug Allergies  Past History:  Past Medical History: elevated BP without diagnosis of hypertension  2-11 DYSLIPIDEMIA  ABDOMINAL PAIN, CHRONIC -- sees GI IBS LEUKOCYTOPENIA - thrombocytopenia UNSPECIFIED -- sees hematology , Rx observation  IBS   Migraine HAs w/ aura , saw neuro 12-10  Past Surgical History: Reviewed history from 06/18/2008 and no changes required. Hysterectomy, NO oophorectomy Cholecystectomy  Social History: Reviewed history from 12/22/2009 and no changes required. 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  Alcohol Use --- rarely tobacco--no Daily Caffeine Use -2 Illicit Drug Use - no  Review of Systems      See HPI  Physical Exam  General:  alert, well-developed, and well-nourished.   Head:  face symmetric not tender to palpation Ears:  slightly bulge TMs, no red, no discharge, canal normal Nose:  not congested Mouth:  no redness Lungs:  normal respiratory effort, no intercostal retractions, no accessory muscle use, and normal breath sounds.   Heart:  normal rate, regular rhythm, and no  murmur.     Impression & Recommendations:  Problem # 1:  OTITIS MEDIA, SEROUS (ICD-381.4) suspect that the ear pressure he is due to serous otitis and allergies Plan, see instructions  Complete Medication List: 1)  Loratadine 10 Mg Tabs (Loratadine) .... One by mouth daily, over-the-counter 2)  Prednisone 10 Mg Tabs (Prednisone) .... 4 tablets by mouth for 2 days, 3x2, 2x2, 1x2 3)  Nasonex 50 Mcg/act Susp (Mometasone furoate) .... 2 estrace on each side of the nose every day  Patient Instructions: 1)  Claritin over-the-counter every day 2)  Nasonex 2 sprays on each side of the nose daily 3)  prednisone by mouth as prescribed 4)  Call me if not better in a few weeks Prescriptions: PREDNISONE 10 MG TABS (PREDNISONE) 4 tablets by mouth for 2 days, 3x2, 2x2, 1x2  #20 x 0   Entered and Authorized by:   Jose E. Paz MD   Signed by:   Nolon Rod. Paz MD on 04/16/2010   Method used:   Electronically to        [facility] [address] [place] (retail)       [phone]       [place], [place]  [address]       Ph: [phone]       Fax: [phone]   RxID:   [id number] NASONEX 50 MCG/ACT SUSP (MOMETASONE FUROATE) 2 Estrace on each side of the nose every day  #1 x 12  Entered and Authorized by:   Nolon Rod. Paz MD   Signed by:   Nolon Rod. Paz MD on 04/16/2010   Method used:   Print then Give to Patient   RxID:   [id number]

## 2010-11-30 NOTE — Assessment & Plan Note (Signed)
Summary: ELEVATED BP/KDC   Vital Signs:  Patient profile:   40 year old female Height:      57 inches Weight:      146.50 pounds BMI:     31.82 Pulse rate:   84 / minute BP sitting:   110 / 70  Vitals Entered By: Kandice Hams (December 22, 2009 9:01 AM) CC: followup on bp   History of Present Illness: last week, she felt  poorly in general, generalized weakness, no HA  but a funny feeling in her head she went to a local pharmacy her blood pressure was 150/100  review of systems denies chest pain, shortness of breath or lower extremity edema not taking any over-the-counter medications no excessive salt intake denies nosebleeds anxiety  slightly worse than usual  Allergies: No Known Drug Allergies  Past History:  Past Medical History: elevated BP without diagnosis of hypertension  2-11 DYSLIPIDEMIA  ABDOMINAL PAIN, CHRONIC -- sees GI IBS LEUKOCYTOPENIA - thrombocytopenia UNSPECIFIED -- sees hematology , Rx observation  IBS    Past Surgical History: Reviewed history from 06/18/2008 and no changes required. 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  Alcohol Use --- rarely tobacco--no Daily Caffeine Use -2 Illicit Drug Use - no  Review of Systems      See HPI  Physical Exam  General:  alert and well-developed.   Neck:  no masses and no thyromegaly.   Lungs:  normal respiratory effort, no intercostal retractions, no accessory muscle use, and normal breath sounds.   Heart:  normal rate, regular rhythm, and no murmur.   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 One by mouth every 6 hours as needed for headache  Other Orders: Venipuncture (16109) TLB-BMP (Basic Metabolic Panel-BMET) (80048-METABOL) TLB-TSH (Thyroid Stimulating Hormone) (84443-TSH)  Patient Instructions: 1)  Check your blood pressure 2 or 3 times a week. If it is more than 135/80  consistently,please let us know  2)  low salt diet 3)  came back fasting in 3 months for a physical

## 2010-11-30 NOTE — Letter (Signed)
[facility]   Imported By: Lanelle Bal 01/08/2010 09:26:59  _____________________________________________________________________  External Attachment:    Type:   Image     Comment:   External Document

## 2010-11-30 NOTE — Letter (Signed)
Summary: HA, started topamax----[facility]  [facility]   Imported By: Lanelle Bal 07/26/2010 10:01:57  _____________________________________________________________________  External Attachment:    Type:   Image     Comment:   External Document

## 2010-12-01 ENCOUNTER — Ambulatory Visit (INDEPENDENT_AMBULATORY_CARE_PROVIDER_SITE_OTHER): Payer: Managed Care, Other (non HMO) | Admitting: Family Medicine

## 2010-12-01 ENCOUNTER — Encounter: Payer: Self-pay | Admitting: Family Medicine

## 2010-12-01 DIAGNOSIS — J309 Allergic rhinitis, unspecified: Secondary | ICD-10-CM | POA: Insufficient documentation

## 2010-12-02 NOTE — Letter (Signed)
Summary: Ear tubes, ETD?----ENT  [facility]   Imported By: Lanelle Bal 10/28/2010 10:59:43  _____________________________________________________________________  External Attachment:    Type:   Image     Comment:   External Document

## 2010-12-02 NOTE — Assessment & Plan Note (Signed)
Summary: sore throat/kn   Vital Signs:  Patient profile:   40 year old female Height:      57 inches Weight:      149.38 pounds BMI:     32.44 Temp:     97.6 degrees F Pulse rate:   80 / minute Pulse rhythm:   regular BP sitting:   128 / 84  (left arm) Cuff size:   regular  Vitals Entered By: Army Fossa CMA (November 23, 2010 10:32 AM) CC: Pt here c/o feeling weak, and sore throat  Comments [facility] [address]    History of Present Illness:  Continue with multiple symptoms since 2010 Pressure in the ears, dizziness, headaches. So far, he has seen neurology and ENT. Neurology diagnosed her with migraine headaches and prescribed Topamax (07-2010) but the patient declines to take it because "is a medicine  for seizures". she saw another neurologist on followup after the initial visit and she had no other suggestions except Topamax (per patient) ENT offer her ear tubes but she so far has declined to go forward as well. CT of the sinuses 8-11 essentially negative the patient has taken her BP whenever she feels pressure in the ears and dizziness at the BP is usually normal or low. she also complained of extreme fatigue,described as generalized lack of energy   ROS Headaches are worse, since January 1st  she had 4 episodes both moderate headaches along with nausea. No vomiting or diarrhea She has some degree of anxiety but  at this point she said  that she is better. She also has some depression, no worse than before. does not know exactly why she feels that way. Things are going okay at home and at work. no arthralgias or myalgias no rash     Current Medications (verified): 1)  None  Allergies (verified): 1)  ! Amoxicillin  Past History:  Past Medical History: h/o epil;epsy , better by age 62 elevated BP without diagnosis of hypertension  2-11 DYSLIPIDEMIA  ABDOMINAL PAIN, CHRONIC -- sees GI IBS LEUKOCYTOPENIA - thrombocytopenia UNSPECIFIED -- sees hematology , Rx  observation  IBS   Migraine HAs w/ aura , saw neuro 12-10  Past Surgical History: Reviewed history from 06/18/2008 and no changes required. Hysterectomy, NO oophorectomy Cholecystectomy  Family History: Family History of Breast Cancer:Maternal Aunt Family History of Diabetes: Father HTN-- F DM-- F Father:  Mother:  Siblings:   Social History: Reviewed history from 12/22/2009 and no changes required. 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  Alcohol Use --- rarely tobacco--no Daily Caffeine Use -2 Illicit Drug Use - no  Physical Exam  General:  alert and well-developed.   Lungs:  normal respiratory effort, no intercostal retractions, no accessory muscle use, and normal breath sounds.   Heart:  normal rate, regular rhythm, and no murmur.   Psych:  Oriented X3, memory intact for recent and remote, normally interactive, good eye contact, and not depressed appearing.  slightly anxious   Impression & Recommendations:  Problem # 1:  MIGRAINE HEADACHE (ICD-346.90) history of migraine headaches, 4 episodes in the last 4 weeks. status post neuro  evaluation ----> the patient declined to take Topamax. At this point, she needs preventive medication. She is also anxious and somehow depressed. SSRIs are likely a good choice. Will start Zoloft  Problem # 2:  DIZZINESS (ICD-780.4) ongoing ill-defined dizziness and ear pressure. Status post ENT evaluation, she was recommended here tubes, patient so  far has declined it because "  there is no guarantee is  going to work" plan----- treat migraines first and then reassess dizziness  Problem # 3:  FATIGUE (ICD-780.79)  generalized fatigue. Labs  Orders: Venipuncture (16109) TLB-BMP (Basic Metabolic Panel-BMET) (80048-METABOL) TLB-CBC Platelet - w/Differential (85025-CBCD) TLB-Sedimentation Rate (ESR) (85652-ESR) TLB-Hepatic/Liver Function Pnl (80076-HEPATIC) Specimen Handling (60454) TLB-Mono  Test (Monospot) (09811-BJYN)  Problem # 4:  SORE THROAT (ICD-462) also have sore throat x 3 weeks, on-off , some subjective fever strep (-) check a mono  Problem # 5:  F2F >> 25 min due to chart review and lengthy pt's description of her sx   Complete Medication List: 1)  Sertraline Hcl 50 Mg Tabs (Sertraline hcl) .... Half tablet daily for one week, then one tablet daily for one week, then 1.5 tablets every day  Other Orders: Rapid Strep (82956)  Patient Instructions: 1)  Please schedule a follow-up appointment in 1 month.  Prescriptions: SERTRALINE HCL 50 MG TABS (SERTRALINE HCL) half tablet daily for one week, then one tablet daily for one week, then 1.5 tablets every day  #60 x 1   Entered and Authorized by:   Elita Quick E. Zoeann Mol MD   Signed by:   Nolon Rod. Jeraldin Fesler MD on 11/23/2010   Method used:   Print then Give to Patient   RxID:   [id number]    Orders Added: 1)  Rapid Strep [87880] 2)  Venipuncture [36415] 3)  TLB-BMP (Basic Metabolic Panel-BMET) [80048-METABOL] 4)  TLB-CBC Platelet - w/Differential [85025-CBCD] 5)  TLB-Sedimentation Rate (ESR) [85652-ESR] 6)  TLB-Hepatic/Liver Function Pnl [80076-HEPATIC] 7)  Specimen Handling [99000] 8)  TLB-Mono Test (Monospot) [86308-MONO] 9)  Est. Patient Level IV [28413]    Laboratory Results    Other Tests  Rapid Strep: negative Comments: Army Fossa CMA  November 23, 2010 10:33 AM

## 2010-12-08 NOTE — Assessment & Plan Note (Signed)
Summary: BOTH EYES SWELLING   Vital Signs:  Patient profile:   40 year old female Weight:      149 pounds BMI:     32.36 Temp:     98.2 degrees F oral BP sitting:   112 / 70  (left arm)  Vitals Entered By: Doristine Devoid CMA (December 01, 2010 2:35 PM) CC: both eyes swollen has been xmon. originally just one eye   History of Present Illness: 40 yo woman here today for bilateral eye swelling.  started on Monday and was initially painful to move eyes.  has happened previously but only unilaterally.  took benadryl on Monday w/out relief.  denies nasal congestion.  denies facial pain.  pt has used nasal sprays in past but not currently.  denies swelling in other places- hands/feet.  Current Medications (verified): 1)  Sertraline Hcl 50 Mg Tabs (Sertraline Hcl) .... Half Tablet Daily For One Week, Then One Tablet Daily For One Week, Then 1.5 Tablets Every Day  Allergies (verified): 1)  ! Amoxicillin  Review of Systems      See HPI  Physical Exam  General:  alert and well-developed.   Head:  NCAT, no TTP over sinuses Eyes:  swelling under eyes bilaterally, no TTP over globes, EOMI, PERRL, no injxn or inflammation Ears:  External ear exam shows no significant lesions or deformities.  Otoscopic examination reveals clear canals, tympanic membranes are intact bilaterally without bulging, retraction, inflammation or discharge. Hearing is grossly normal bilaterally. Nose:  edematous turbinates bilaterally Mouth:  + PND Neck:  no masses and no thyromegaly.   Lungs:  normal respiratory effort, no intercostal retractions, no accessory muscle use, and normal breath sounds.   Heart:  normal rate, regular rhythm, and no murmur.   Extremities:  no C/C/E   Impression & Recommendations:  Problem # 1:  ALLERGIC RHINITIS (ICD-477.9) Assessment New pt's sxs consistent allergies.  sample of nasonex given.  start OTC antihistamine daily for maintainence.  prednisone to control current sxs.   reviewed supportive care and red flags that should prompt return.  Pt expresses understanding and is in agreement w/ this plan.  Complete Medication List: 1)  Sertraline Hcl 50 Mg Tabs (Sertraline hcl) .... Half tablet daily for one week, then one tablet daily for one week, then 1.5 tablets every day 2)  Prednisone 20 Mg Tabs (Prednisone) .... 2 pills daily x5 days.  take w/ food.  Patient Instructions: 1)  Your eye swelling is most likely allergy related 2)  Take the Prednisone to decrease the swelling- take w/ food to avoid upset stomach 3)  Continue the Benadryl or Claritin for the allergies 4)  Use the nasal spray- 2 sprays each nostril daily to decrease the congestion and swelling 5)  Hang in there!!! Prescriptions: PREDNISONE 20 MG TABS (PREDNISONE) 2 pills daily x5 days.  take w/ food.  #10 x 0   Entered and Authorized by:   Neena Rhymes MD   Signed by:   Neena Rhymes MD on 12/01/2010   Method used:   Electronically to        [facility] [address] (retail)       [phone]       [place], [place]  [address]       Ph: [phone]       Fax: [phone]   RxID:   [id number]    Orders Added: 1)  Est. Patient Level III [  99213] 

## 2011-01-04 ENCOUNTER — Other Ambulatory Visit: Payer: Self-pay | Admitting: Otolaryngology

## 2011-01-04 DIAGNOSIS — R42 Dizziness and giddiness: Secondary | ICD-10-CM

## 2011-01-04 DIAGNOSIS — R5383 Other fatigue: Secondary | ICD-10-CM

## 2011-01-04 DIAGNOSIS — R51 Headache: Secondary | ICD-10-CM

## 2011-01-06 ENCOUNTER — Other Ambulatory Visit: Payer: Managed Care, Other (non HMO)

## 2011-01-27 ENCOUNTER — Encounter: Payer: Self-pay | Admitting: Internal Medicine

## 2011-01-28 ENCOUNTER — Ambulatory Visit (INDEPENDENT_AMBULATORY_CARE_PROVIDER_SITE_OTHER): Payer: Managed Care, Other (non HMO) | Admitting: Internal Medicine

## 2011-01-28 ENCOUNTER — Encounter: Payer: Self-pay | Admitting: Internal Medicine

## 2011-01-28 DIAGNOSIS — R5381 Other malaise: Secondary | ICD-10-CM

## 2011-01-28 DIAGNOSIS — G43909 Migraine, unspecified, not intractable, without status migrainosus: Secondary | ICD-10-CM

## 2011-01-28 DIAGNOSIS — R42 Dizziness and giddiness: Secondary | ICD-10-CM

## 2011-01-28 DIAGNOSIS — D696 Thrombocytopenia, unspecified: Secondary | ICD-10-CM

## 2011-01-28 DIAGNOSIS — R030 Elevated blood-pressure reading, without diagnosis of hypertension: Secondary | ICD-10-CM

## 2011-01-28 LAB — VITAMIN B12: Vitamin B-12: 343 pg/mL (ref 211–911)

## 2011-01-28 LAB — CBC WITH DIFFERENTIAL/PLATELET
Basophils Absolute: 0 10*3/uL (ref 0.0–0.1)
HCT: 37.5 % (ref 36.0–46.0)
Lymphs Abs: 1 10*3/uL (ref 0.7–4.0)
MCV: 94.3 fl (ref 78.0–100.0)
Monocytes Absolute: 0.3 10*3/uL (ref 0.1–1.0)
Platelets: 109 10*3/uL — ABNORMAL LOW (ref 150.0–400.0)
RDW: 13.5 % (ref 11.5–14.6)

## 2011-01-28 LAB — BASIC METABOLIC PANEL
Calcium: 9.5 mg/dL (ref 8.4–10.5)
GFR: 85.09 mL/min (ref >60.00)
Sodium: 142 mEq/L (ref 135–145)

## 2011-01-28 NOTE — Assessment & Plan Note (Signed)
At some point her blood pressure was

## 2011-01-28 NOTE — Assessment & Plan Note (Addendum)
Currently undergoing a workup by neurology, they ordered an MRI, see history of present illness She has been off  sertraline for few weeks, recommend to restart it for now

## 2011-01-28 NOTE — Progress Notes (Signed)
  Subjective:    Patient ID: Rachel Ochoa, female    DOB: 13-Apr-1971, 40 y.o.   MRN: [id number]  HPI Her chief complaint today is fatigue, this is going on for several months, 2 months ago she stopped going to the gym because of tiredness, 3 weeks ago she d/c  Zoloft thinking that the medication was causing the fatigue however neither of those steps has helped her energy level. In fact sx is progressively worse. Has noted her blood pressure to be rather in the low side, a few days ago at her pharmacy, the blood pressure was around 100/? and a pulse was elevated to around 100  Past Medical History  Diagnosis Date  . Epilepsy     better by age 65  . Elevated BP     without diagnosis of hypertension 2/11  . Dyslipidemia   . Abdominal pain     chronic sees GI IBS  . Leukocytopenia     thromboycytopenia, unspecified- sees hematology, rx oberservation  . IBS (irritable bowel syndrome)   . Migraine aura, persistent     saw neuro 12/10   Past Surgical History  Procedure Date  . Abdominal hysterectomy     no oophorectomy  . Cholecystectomy        Review of Systems  Respiratory:       No snoring, no sleepy  Musculoskeletal:       No arthralgias or myalgias    No fevers, has lost 5 pounds in the last few weeks No dyspnea on exertion, 3 days ago she had that he'll do fine difficulty breathing and chest discomfort for a few minutes, at rest No LE  edema, occasionally has palpitations No cough or wheezing No nausea, vomiting, blood in the stools. Occasionally she has loose stools Despite stopping Zoloft, her HAs are not worse  Ongoing issues with the ill-defined dizziness and ear pressure, she has seen ENT before and currently is seeing a neurologist, they order a brain MRI but patient had to cancel due to to cost    Objective:   Physical Exam  Constitutional: She appears well-developed and well-nourished. No distress.  HENT:  Head: Normocephalic and atraumatic.  Eyes: No scleral  icterus.  Cardiovascular: Normal rate, regular rhythm and normal heart sounds.   Pulmonary/Chest: Effort normal and breath sounds normal. No respiratory distress. She has no wheezes. She has no rales.  Musculoskeletal: She exhibits no edema.  Psychiatric: Judgment and thought content normal.       No depression, mild anxiety noted          Assessment & Plan:

## 2011-01-28 NOTE — Assessment & Plan Note (Signed)
Ongoing issue 

## 2011-01-28 NOTE — Assessment & Plan Note (Addendum)
Several months history of fatigue, labs in the last few months are all normal. EKG today normal sinus rhythm Orthostatic vital signs: BP around 94/70 lying and standing. No tachycardia. Chart reviewed, her blood pressure in the last couple of years varies from the 90s to the 120s. Etiology of her sx is unclear  the patien has a number of unexplained symptoms as well as the dx  IBS. Chronic fatigue syndrome? Plan: Labs (reports a normal TSH at neurology recently) reassess in 6 weeks

## 2011-01-31 ENCOUNTER — Encounter: Payer: Self-pay | Admitting: Internal Medicine

## 2011-02-01 ENCOUNTER — Other Ambulatory Visit: Payer: Managed Care, Other (non HMO)

## 2011-02-05 ENCOUNTER — Ambulatory Visit
Admission: RE | Admit: 2011-02-05 | Discharge: 2011-02-05 | Disposition: A | Discharge status: Home / Self Care | Payer: Managed Care, Other (non HMO) | Source: Ambulatory Visit | Attending: Otolaryngology | Admitting: Otolaryngology

## 2011-02-05 DIAGNOSIS — R42 Dizziness and giddiness: Secondary | ICD-10-CM

## 2011-02-05 DIAGNOSIS — R51 Headache: Secondary | ICD-10-CM

## 2011-02-05 DIAGNOSIS — R5383 Other fatigue: Secondary | ICD-10-CM

## 2011-02-05 MED ORDER — GADOBENATE DIMEGLUMINE 529 MG/ML IV SOLN
13.0000 mL | Freq: Once | INTRAVENOUS | Status: AC | PRN
  Administered 2011-02-05: 13 mL via INTRAVENOUS

## 2011-03-15 NOTE — Assessment & Plan Note (Signed)
HEALTHCARE                             PULMONARY OFFICE NOTE   NAME:Rachel Ochoa, Rachel Ochoa                        MRN:          [id number]  DATE:04/05/2007                            DOB:          Nov 26, 1970    CHIEF COMPLAINT:  Chest pain.   HISTORY OF PRESENT ILLNESS:  This is a 40 year old Hispanic female who  first developed centralized abdominal pain in 2005 and underwent  cholecystectomy with complete resolution of her pain.  However, within  several months she began having left upper quadrant pain, anterior in  nature and somewhat positional in that it hurt in certain positions,  twisting and turning, but is not definitely related to bowel or bladder  problems.  She was thought to have probable irritable bowel syndrome on  thorough workup by Dr. Leone Payor in 2007, but could not tolerate  Hycosamine because it made her feel drowsy.  Since that time, the pain  is on an almost daily basis, waxed and waned, but especially severe  over the last two weeks.  It does not have any pleuritic component and  is always located in the anterior left chest just below the rib costal  margin.  She denies any nausea, vomiting, diaphoresis, cough, dyspnea,  myalgias, arthralgias, fevers, chills, sweats, or change in bowel or  bladder habits.   PAST MEDICAL HISTORY:  Significant for hyperlipidemia only.  She is  status post both hysterectomy in 2005 and cholecystectomy as noted  above.   The patient has already been treated with a course of antibiotics for  presumptive pneumonia by chest x-ray and CT scan (see comment below),  but denies any improvement in all in her symptoms and says the best she  feels is after she uses Advil or Alleve.   ALLERGIES:  None known.   MEDICATIONS:  Multi-Vites only.   SOCIAL HISTORY:  She has never smoked.  She has no unusual child, pet or  hobby exposure.   FAMILY HISTORY:  Positive for cancer in her aunt.  Unknown cell type.   REVIEW OF SYSTEMS:  Taken in detail on the work sheet, negative except  as outlined above.   PHYSICAL EXAMINATION:  GENERAL:  This is an anxious but quite pleasant  and healthy appearing ambulatory Hispanic female in no acute distress.  VITAL SIGNS:  Stable vital signs.  HEENT:  Unremarkable.  Pharynx is clear.  Dentition intact.  NECK:  Supple without cervical adenopathy or tenderness.  Trachea is  midline.  LUNGS:  Lung fields perfectly clear bilaterally to auscultation and  percussion.  HEART:  Regular rate and rhythm without murmurs, gallops, rubs.  ABDOMEN:  Soft, benign.  EXTREMITIES:  Warm without calf tenderness, cyanosis, clubbing or edema.   STUDIES:  Chest x-ray from Mar 28, 2007, indicates a superior segmental  density which on CT scan is minimal atelectatic change with no  associated fusion.   Chest x-ray done today does show increased visceral air into the left  diaphragm.   IMPRESSION:  1. 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  The reasoning is, the pain is always located      anteriorly and is positional in nature and not pleuritic.  The fact      that she did not respond to Hycosamine does not mean anything      because she actually could not take the Hycosamine.  I explained      the splenic flexure syndrome to her in detail and recommended      Citrucel 1 teaspoon perfectly regular b.i.d. with next step      consider referring back to Dr. Leone Payor if the discomfort continues.  2. The abnormalities on the CT scan and chest x-ray are minimal and      are obviously not the explanation for her pain.  The abnormalities      are posterior and are more consistent with a plaque-like      atelectatic area which I suppose could be related to the pain, and      if she develops air in the splenic flexure, she may not take as      deep a breath on that side chronically, but in the absence of cough      or pleuritic  features, or for that matter significant fevers or      chills, I do not believe it is likely at all that pneumonia is      present.     Charlaine Dalton. Sherene Sires, MD, [facility]  Electronically Signed    MBW/MedQ  DD: 04/05/2007  DT: 04/06/2007  Job #: [id number]   cc:   Peggye Pitt, M.D.

## 2011-03-15 NOTE — Assessment & Plan Note (Signed)
[facility]                         GASTROENTEROLOGY OFFICE NOTE   NAME:Ochoa, Rachel                        MRN:          [id number]  DATE:05/22/2007                            DOB:          [birth year]    CHIEF COMPLAINT:  Abdominal pain.   HISTORY OF PRESENT ILLNESS:  Ms. Sano was seen at the ER on July 6.  I  reviewed those notes.  She was having left upper quadrant pain, fairly  severe.  Also, in the epigastrium.  She notes that coffee seems to make  things worse.  The pain is better, but when she lies on her side, she  still feels a pressure and a pain there.  It is similar to a problem for  which I saw her last year, and she had a normal EGD and flex-sig.  We  tried Levbid, but it made her sleepy.  She did not keep followup.  She  has recently seen Dr .Sherene Sires because of chest pain.  She had some minimal  abnormalities on chest CT and x-ray but thought that those were not  significant problems.  Thought she had irritable bowel syndrome.  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  I do not believe she  has lost any significant weight.  There are no bowel habit changes.  There is no bleeding.  She did have a platelet level of 100,000, and she  was in the emergency department.  Otherwise, CBC, C-met, urinalysis were  okay.   PAST MEDICAL HISTORY:  1. Cholecystectomy.  2. Partial hysterectomy 2005.  3. Normal EGD and flexible sigmoidoscopy in 2007.  4 . Chronic recurrent abdominal pain.  1. Dyslipidemia .  2. No known drug allergies.   CURRENT MEDICATIONS:  None.   She did not take the hydrocodone as prescribed because of a concern  about side effects.  Overall, she is better.  Note, she did have an  elevated AST and ALT of 49 and 39 on her other labs.  Toxicology screen  negative.  Urine pregnancy negative.  Cardiac markers negative.  Abdominal x-rays unrevealing.   PHYSICAL EXAMINATION:  VITAL SIGNS:  Weight 132 pounds, pulse  80, blood  pressure 96/58.  GENERAL:  Well-developed, well-nourished Hispanic woman.  HEENT:  Eyes anicteric.  NECK:  Supple.  ABDOMEN:  Soft.  Ribs nontender.  I think I can palpate the spleen on  deep palpation, though she is not a large woman, so I may be palpating  some colon.  She is clearly tender on that deep palpation.  There is no  tenderness with muscle tension, however.  There is no abdominal wall  hernia.  EXTREMITIES:  Lower extremities free of edema.  SKIN:  Warm and dry.  No acute rashes.  NEUROLOGICAL:  Alert and oriented x3.   ASSESSMENT:  1. Recurrent abdominal pain in the left upper quadrant.  2. Question related to the spleen, given the thrombocytopenia.      Transaminases elevated also raise the question of liver process,      though fatty liver would be statistically more likely than  cirrhosis at this point.  Irritable bowel syndrome is still      certainly possible as well.   PLAN:  1. She is to try the hydrocodone as prescribed for pain.  2. Schedule abdominal ultrasound to evaluate for splenomegaly, perhaps      any liver abnormalities too.  3. Recheck CBC with differential and hepatic function panel.  4. Phone call result to the patient.  Further plans pending these      results.     Iva Boop, MD,FACG  Electronically Signed    CEG/MedQ  DD: 05/22/2007  DT: 05/22/2007  Job #: [id number]   cc:   Willow Ora, MD

## 2011-03-15 NOTE — Assessment & Plan Note (Signed)
[facility]                         GASTROENTEROLOGY OFFICE NOTE   NAME:Rachel Ochoa, DAJANAY                        MRN:          [id number]  DATE:07/09/2007                            DOB:          24-May-1971    CHIEF COMPLAINT:  Followup of irritable bowel syndrome,  thrombocytopenia, leukopenia.   See my note of May 22, 2007.   Her abdominal pain is basically resolved on dicyclomine.  She has some  questions about whether or not she can eat certain foods after her  gallbladder is out, like cheese, etc., as someone told her she should  not be eating that or tomato paste after she had her gallbladder out.  She does not think those foods reliably bother her.  Her white count was  4.2 with a platelet count of 120, but normal LFTs on May 22, 2007.  Ultrasound demonstrated no splenomegaly.   The only medication she is taking is dicyclomine 20 mg three times a  day.  I think she is using it at bedtime rather than before all meals.   PHYSICAL EXAMINATION:  Well-developed, young Hispanic woman.  Her weight  132 pounds, height 5 foot 1, pulse 68, blood pressure 100/68.  She is  tender in the left upper quadrant with deep palpation.  I do not think I  am palpating the spleen.  There is a little bit of fullness there.   ASSESSMENT:  Left upper quadrant pain, I do not think it was from  splenomegaly though.  She does have mild thrombocytopenia and leukopenia  raising that question.  There does not seem to be any evidence of liver  disease.   I think she has irritable bowel syndrome and leukopenia and  thrombocytopenia of unexplained etiology at this point.  Some splenic  process could be causing that, i.e., could she have some type of  inflammation there, could she have had an infection.   PLAN:  1. Recheck CBC.  If she has persistently low platelets and white count      I am going to refer her to a hematologist.  2. She will continue the dicyclomine, try  to go to as needed.  3. If she has a normal CBC, would follow this up at about 3-6 months.  4. Otherwise clinical followup as needed.     Iva Boop, MD,FACG  Electronically Signed    CEG/MedQ  DD: 07/09/2007  DT: 07/09/2007  Job #: [id number]   cc:   Willow Ora, MD

## 2011-03-18 NOTE — H&P (Signed)
NAMEBELMA, DYCHES                            ACCOUNT NO.:  [id number]   MEDICAL RECORD NO.:  [id number]                   PATIENT TYPE:  INP   LOCATION:  NA                                   FACILITY:  [facility]   PHYSICIAN:  Diana B. Thomasena Edis, M.D.              DATE OF BIRTH:  03/10/1971   DATE OF ADMISSION:  06/18/2002  DATE OF DISCHARGE:                                HISTORY & PHYSICAL   CURRENT HISTORY:  The patient is a 40 year old Hispanic gravida 2 para 2  female who delivered a baby in December 2002.  The patient was seen by Dr.  Vanita Panda at [facility] and sent to me  for evaluation of pelvic pain.  Since that time the patient states that her  pain has disappeared but she was also referred for a possible cystocele.  She does give a history of leaking urine with coughing or sneezing and does  give a history of some possible urge incontinence, stating that she cannot  hold her urine as soon as she feels the urge to urinate and has to run  straight to the bathroom.  She has been worked up extensively.  Pap smear  was noted to be normal.  Urinalysis was negative.  Culture and sensitivity  was negative as well.  The patient was examined and found to have a very  extensive second degree cystocele with flattened UV angle.  I did place a  ring with support pessary with total resolution of her symptoms - her stress  urinary incontinence as well as her pelvic pain totally and completely  resolved.  The patient is very adamant that this be repaired permanently.  She does not want to continue using her ring with support pessary.  I did  discuss her case with a urogynecologist, explaining that the patient does  not desire to lose her uterus but she does desire to have this cystocele  fixed.  The patient was very aggressive on several occasions, stating that  she absolutely had to have this repaired due to the prolapse as well as due  to her stress urinary  incontinence.  She is now having no symptoms of  detrusor instability.  The urogynecologist suggested that I do an anterior  colporrhaphy and I have spent long discussions with the patient explaining  the risks and benefits of anterior colporrhaphy.  She is made aware that if  she does become pregnant again, she may likely desire a cesarean section to  preserve this repair.  I has extensively discussed with her what should be  her goals for treatment and explained that this is a very remarkable  prolapse for one so young.  It has been explained to her that the process of  pelvic prolapse will likely continue and the reason for this.  I have also  urged her to do no heavy  lifting.  I have explained that it is very likely  that this will recur, requiring another repair.  The patient adamantly  states she desires no further children but she does not want to have a  hysterectomy at this time and, in fact, she has no uterine prolapse; there  is no rectocele.  The patient was very relieved that I was willing to do  this anterior colporrhaphy but she does understand that the process is  likely to continue and she may require a further procedure by a  urogynecologist using some type of mesh.  She absolutely expresses her  agreement with the goals of this surgery.  She has been able to repeat back  to me with what appears to be thorough understanding that this repair may  not actually be permanent and she may require another repair, and that she  needs to have very realistic goals for her surgery.  Again, by way of this  record, I want to reiterate that I have spent hours with this patient making  sure that she understands that this process is likely to continue in view of  the very dramatic prolapse.  She expressed understanding of this and desires  to proceed with the surgery.  Risks of surgery including anesthetic  complications; hemorrhage; infection; damage to adjacent structures  including  bladder, bowel, blood vessels, ureter discussed with the patient.  She also knows she is likely to require a subsequent procedure later on.  In  addition, she knows that she may need to go home with the catheter in her  bladder and do bladder training over a period of several weeks before the  catheter and leg bag can be removed and she expresses understanding of this.  She is thus admitted for an anterior colporrhaphy for a large symptomatic  cystocele.   PAST MEDICAL HISTORY:  None.  The patient's last Pap smear performed by me  on April 25, 2002 showed some slight squamous atypia.  The patient did  undergo a wet prep which was negative and is scheduled for a repeat Pap in  late September or early October due to the slight squamous atypia.   ALLERGIES:  No known drug allergies.   MEDICATIONS:  Ortho-Ever patch.   SURGERIES:  Tonsillectomy, September 20, 2000; reduction mammoplasty.   FAMILY HISTORY:  There is no family history of colon, breast, ovarian, or  prostate cancer.  The patient's mother is 42, alive and well.  The father is  55 with hypertension and diabetes.  Two children - one eight years and  another who is nine months, alive and well.   SOCIAL HISTORY:  Occupation:  The patient is a Futures trader.  Tobacco:  None.  Alcohol:  None.   REVIEW OF SYSTEMS:  Noncontributory except as noted above.  Denies headache,  visual changes, chest pain, shortness of breath, abdominal pain, change in  bowel habits, unintentional weight loss, dysuria, urgency, frequently,  vaginal pruritus or discharge, pain or bleeding with intercourse.   PHYSICAL EXAMINATION:  GENERAL APPEARANCE:  A well-developed Hispanic  female.  VITAL SIGNS:  Blood pressure 120/84, heart rate 80.  HEENT:  Normal.  NECK:  Supple without thyromegaly, adenopathy, or nodules.  CHEST:  Clear to auscultation. BREASTS:  Symmetrical without masses, nodes, nipple retraction, or nipple  discharge.  Reduction mammoplasty  scars noted.  CARDIAC:  Regular rate and rhythm without murmurs, rubs, or gallops.  ABDOMEN:  Soft, nontender.  No hepatosplenomegaly or masses.  EXTREMITIES:  No cyanosis, clubbing, or edema.  NEUROLOGIC:  Oriented x3.  Grossly normal.  PELVIC:  Normal external female genitalia.  No vulvar, vaginal, or cervical  lesions.  There is a large second degree cystocele with normal vaginal rugae  and no rectocele.  Bimanual examination reveals the uterus to be six weeks,  nontender, without any adnexal mass palpated.  There is no uterine prolapse  with the patient being examined in the standing and reclining position.  There also is no rectocele but there is a very large second degree cystocele  with flattened UV angle.  RECTAL:  Excellent sphincter tone, confirms pelvic exam.  No masses  palpated.   ASSESSMENT AND PLAN:  The patient is a 40 year old gravida 2 para 2 Hispanic  female with a large cystocele admitted for anterior colporrhaphy.  The  patient does understand that the process of prolapse will likely continue,  requiring further surgery, and she expresses understanding of and acceptance  of these risks.  She further understands that she is likely to be unable to  void with bladder trials initially and will likely go home with an  indwelling Foley  catheter and leg bag.  She has also been made aware of postoperative risks  including wound infection, urinary tract infection and urinary retention,  atelectasis, DVT which could result in stroke or pulmonary embolism.  She  expresses understanding of and acceptance of all these risks, and desires to  proceed.                                                Beather Arbour Thomasena Edis, M.D.    DBC/MEDQ  D:  06/17/2002  T:  06/17/2002  Job:  [id number]   cc:   Vanita Panda, M.D.  [facility]  [facility]

## 2011-03-18 NOTE — Assessment & Plan Note (Signed)
[facility]                           GASTROENTEROLOGY OFFICE NOTE   NAME:Rachel Ochoa, Rachel Ochoa                        MRN:          [id number]  DATE:06/15/2006                            DOB:          12/23/1970    ADDENDUM:  She did have stool cultures that were negative as well.                                   Iva Boop, MD, [facility]   CEG/MedQ  DD:  06/15/2006  DT:  06/15/2006  Job #:  [id number]

## 2011-03-18 NOTE — Discharge Summary (Signed)
Rachel Ochoa, Rachel Ochoa                            ACCOUNT NO.:  [id number]   MEDICAL RECORD NO.:  [id number]                   PATIENT TYPE:  INP   LOCATION:  9104                                 [facility]   PHYSICIAN:  Juan H. Lily Peer, M.D.             DATE OF BIRTH:  12/02/1970   DATE OF ADMISSION:  07/28/2003  DATE OF DISCHARGE:  07/30/2003                                 DISCHARGE SUMMARY   DISCHARGE DIAGNOSES:  1. Intrauterine pregnancy 37 weeks, delivered.  2. Evidence of intrauterine growth restriction, oligohydramnios.  3. Rh negative.  4. Status post spontaneous vaginal delivery.  5. Small postpartum hemorrhage.   HISTORY:  This is a 40 year old female gravida 4, para 2, aborta 1 with an  EDC of August 17, 2003.  Prenatal course had been complicated by ultrasound  at 34 weeks question of presentation, breech at vertex.  Ultrasound done but  DPD measurements were 33 weeks.  Head circumference 37 weeks.  Abdominal  circumference 34 weeks and femur length 34 weeks.  The AFI was borderline at  8.4.  The patient had nonstress test which was reactive and her urine was  sent for urine culture because it was nonspecific.  The patient could not  stay, left.  Was asked to return on Monday where an AFI and nonstress test  were repeated.  The AFI dropped to 6.7, now in the third percentile at 37  weeks.  Fetus in vertex position.  Nonstress test reactive.  Cervix 1 cm,  posterior.  For the diagnosis of oligohydramnios with IUGR patient was to be  admitted for induction.  Her pregnancy course had also been significant for  cystocele and second degree uterine prolapse.  Also, she received an Rh  negative in pregnancy.   HOSPITAL COURSE:  On July 28, 2003 patient was admitted at 37 weeks for  IUGR and oligohydramnios, begun on IV penicillin for unknown group B Strep  status and was given Cervidil, then Pitocin.  Subsequently, underwent a  spontaneous vaginal delivery of a  female, Apgars of 6 and 9, weight of 5  pounds 7 ounces.  It was noted that the cord was wrapped around both legs.  There was small postpartum hemorrhage episode which was treated with massage  and Cytotec 800 mg x1 with good results.  There were no lacerations.  Postpartum patient remained afebrile, voiding, stable condition.  She was  discharged to home in satisfactory condition on July 30, 2003.   ACCESSORY CLINICAL FINDINGS:  Laboratories:  The patient is A-.  Rubella  immune.  On July 29, 2003 hemoglobin was 11.4, platelets 119,000.  On  admission they had been 137,000.   DISCHARGE INSTRUCTIONS:  The patient is discharged to home to return to  office six weeks. If patient has any problem prior to that time to be seen  in the office.  Was taking Motrin p.r.n. pain  and was given RhoGAM prior to  discharge.     Susa Loffler, P.A.                    Juan H. Lily Peer, M.D.    TSG/MEDQ  D:  08/29/2003  T:  08/29/2003  Job:  [id number]

## 2011-03-18 NOTE — Assessment & Plan Note (Signed)
[facility]                           GASTROENTEROLOGY OFFICE NOTE   NAME:Ochoa, Rachel                        MRN:          [id number]  DATE:06/15/2006                            DOB:          February 13, 1971    REFERRING PHYSICIAN:  Willow Ora, MD   CONSULTATION NOTE:   REQUESTING PHYSICIAN:  Willow Ora, M.D.   REASON FOR CONSULTATION:  Abdominal pain.   ASSESSMENT:  A 40 year old woman from the [place] who has had a  several month history of left upper quadrant pain.  It occurs after eating,  but she can have a dull pain there at other times.  Her bowel habits have  alternated somewhat.  She has failed a trial of Bentyl.  a CBC and a CMET  are unremarkable.  There is a history of cholecystectomy for epigastric pain  and a dysfunctional gallbladder.   Differential diagnosis including irritable bowel syndrome, peptic ulcer  disease.  These are the 2 most likely things it sounds to me.  She has lost  a little weight and has some early satiety, so a gastrointestinal malignancy  is possible, though it seems unlikely in her age group and given the labs.   PLAN:  1. Upper GI endoscopy and flexible sigmoidoscopy to try to understand the      cause of the left upper quadrant pain and change in bowel habits.  2. Trial of hyoscyamine 0.375 mg b.i.d.  3. Further plans pending the above.  Risks, benefits and indications of      the procedures were explained to the patient.  She understands and      agrees to proceed.   HISTORY:  A 40 year old woman as described above.  She feels like she has a  dull pain when she wakes up in the morning, though is not having problems  when she sleeps.  The pain is in the left upper quadrant, often worse after  she eats, but not always.  She saw Dr. Drue Novel and had laboratory studies and a  examination.  I have reviewed his records.  She got a trial of dicyclomine,  which did not really help.  The symptoms seemed to  start after she had a  cholecystectomy, which was a somewhat different type of pain in the  epigastrium.  That surgery was performed in [place], and I do not have  records of that.  She does not describe nausea, vomiting, heartburn or  dysphagia.  There is no rectal bleeding.  Sometimes stools are loose,  sometimes they are hard.  She thinks they are hard when she does not drink  enough fluids and seem to resolve after that.   PAST MEDICAL HISTORY:  1. Cholecystectomy as described above.  2. Partial hysterectomy in 2005.   MEDICATIONS:  Birth control patch.   DRUG ALLERGIES:  None known.   FAMILY HISTORY:  Review is negative including colon cancer.   SOCIAL HISTORY:  She is married.  She is a Futures trader.  Two sons and one  daughter.  There is no alcohol, tobacco or drugs.   REVIEW  OF SYSTEMS:  Otherwise negative.   PHYSICAL EXAMINATION:  GENERAL:  A pleasant, well-developed Hispanic woman  in no acute distress.  VITAL SIGNS:  Weight 142 pounds, height 5 feet.  Blood pressure 110/80,  pulse 60.  HEENT:  The eyes are anicteric.  ENT normal oropharynx.  NECK:  Supple without mass.  CHEST:  Clear.  HEART:  S1 and S2.  No murmurs, rubs, or gallops.  ABDOMEN:  Soft without any mass.  Abdomen is tender in the left upper  quadrant and some left lower quadrant area.  No organomegaly or mass is  detected.  No hernia.  RECTAL:  Deferred at this time.  LYMPHATICS:  No appreciable nodes.  EXTREMITIES:  No edema.  SKIN:  No rash.  NEUROLOGIC/PSYCHIATRIC:  She is alert and oriented x3.   I appreciate the opportunity to care for this patient.   ADDENDUM:  She did have stool cultures that were negative as well.                                  Iva Boop, MD, [facility]   CEG/MedQ  DD:  06/15/2006  DT:  06/15/2006  Job #:  [id number]   cc:   Willow Ora, MD

## 2011-03-18 NOTE — Op Note (Signed)
Rachel Ochoa, Rachel Ochoa                            ACCOUNT NO.:  [id number]   MEDICAL RECORD NO.:  [id number]                   PATIENT TYPE:  INP   LOCATION:  9321                                 [facility]   PHYSICIAN:  Diana B. Thomasena Edis, M.D.              DATE OF BIRTH:  08/04/1971   DATE OF PROCEDURE:  06/18/2002  DATE OF DISCHARGE:                                 OPERATIVE REPORT   PREOPERATIVE DIAGNOSES:  Symptomatic large second-degree cystocele with  flattened urethrovesical angle.   POSTOPERATIVE DIAGNOSES:  Symptomatic large second-degree cystocele with  flattened urethrovesical angle.   PROCEDURE:  Anterior colporrhaphy.   SURGEON:  Beather Arbour. Thomasena Edis, M.D.   ASSISTANT SURGEON:  Raynald Kemp, M.D.   ANESTHESIA:  General endotracheal.   ESTIMATED BLOOD LOSS:  350 cc.   FLUIDS:  1700 cc of Crystalloid.   DRAINS:  Foley.   COMPLICATIONS:  None.   INDICATIONS:  The patient is a 41 year old Hispanic female, gravida 2, para  2, with a large cystocele with a flattened UV angle.  When the patient  initially presented, she was complaining of symptoms of detrusor instability  of stress urinary incontinence that she had just recently delivered.  She  was monitored over an extensive period of time with subsequent resolution of  her stress urinary incontinence and detrusor instability symptoms.  However,  she continued to complain of the cystocele and pelvic pain associated with  the cystocele.  She was quite concerned that the cystocele would stick to  her underwear and also that she had significant pain with this.  A ring with  support pessary was placed and the patient was able to void without  difficulty with total resolution of her symptoms and reduction of her  cystocele.  She adamantly desires repair of this cystocele and understands  fully that this is likely to be a temporizing situation and she may  ultimately require a hysterectomy, rectocele and cystocele  repair at a later  date.  However, at this time she has no cystocele and no uterine prolapse.   DESCRIPTION OF PROCEDURE:  The patient was brought to the operating room,  identified on the operating table.  After induction of adequate general  endotracheal anesthesia, the patient was placed in the dorsal lithotomy  position and prepped and draped in the usual sterile fashion.  A Foley  catheter was placed.  The cystocele was again identified and noted to be a  large cystocele with flattened UV angle.  The patient adamantly desires to  keep her uterus at this time.  Allis clamps were placed at either angle of  the base of the anterior vaginal wall and an incision was made.  Allis clamp  was placed in the midline and a midline incision was made and carried to  within approximately 1.5 cm of the UV angle.  Allis clamps were placed on  the cut edges of the incision.  Subsequently, using sharp and blunt  dissection, the pubovesical cervical fascia was mobilized from the anterior  vaginal wall.  Dissection proceeded very smoothly and great care was taken  to not buttonhole the anterior vaginal wall.  The pubourethral ligaments  were identified and clamped with one ratchet of Kocher clamps bilaterally.  Using a suture of 2-0 PDS, pubourethral ligament suspension suture was  placed using the near-far far-near stitch with excellent restoration of the  UV angle.  Subsequently, using interrupted sutures of 2-0 Vicryl, the fascia  was reapproximated in the midline to reduce the cystocele.  The vaginal  mucosa was trimmed.  There was noted to be a bleeding sinus and excellent  hemostasis of the sinus was achieved using figure-of-eight sutures of 2-0  Vicryl.  Subsequently, the anterior vaginal wall was closed in a simple  running interlocking fashion.  Excellent reduction of the cystocele was  noted and excellent restoration of the UV angle was noted.  There was noted  to be oozing during the  dissection but at the end of the case there was  noted to be no bleeding.  Subsequently, the Foley catheter was left to drain  and there was noted to be no blood in the urine.  The vagina was then packed  with vaginal packing of Nu Gauze coated with Estrace.  At that point, the  procedure was then terminated.  The patient tolerated the procedure well  without apparent complications and was transferred to the recovery room in  stable condition after all sponge, instrument, and needle counts were  correct.  It should be noted that this was done because of the patient's  complaints of pelvic pain associated with her cystocele with total  resolution of her pain with placement of the ring with support pessary.  The  patient's detrusor instability and stress urinary incontinence symptoms have  totally resolved a few months after her delivery.                                                Beather Arbour Thomasena Edis, M.D.    DBC/MEDQ  D:  06/18/2002  T:  06/18/2002  Job:  [id number]   cc:   Vanita Panda, M.D.  [employer]

## 2011-03-18 NOTE — H&P (Signed)
Rachel Ochoa, Rachel Ochoa                            ACCOUNT NO.:  [id number]   MEDICAL RECORD NO.:  [id number]                   PATIENT TYPE:  INP   LOCATION:  9104                                 [facility]   PHYSICIAN:  Juan H. Lily Peer, M.D.             DATE OF BIRTH:  05-20-1971   DATE OF ADMISSION:  07/28/2003  DATE OF DISCHARGE:                                HISTORY & PHYSICAL   CHIEF COMPLAINT:  1. Term intrauterine pregnancy at 56 weeks estimated gestational age.  2. Oligohydramnios.  3. Intrauterine growth retardation.   HISTORY:  The patient is a 40 year old gravida 4, para 2, AB 1, whose  estimated date of confinement is August 17, 2003.  The patient is currently  at [redacted] weeks gestation.  She is being admitted for induction due to the fact  that at the 34-week gestation visit there was question whether the  presentation was breech or vertex; so, she had an ultrasound done on the  24th.  The fetus was in the vertex presentation, but BPD measurements were  at 33 weeks; head circumference 37 weeks, abdominal circumference 34 weeks  and femur length of 34 weeks, and the patient by dates was 36-5/7ths weeks.  The estimated fetal weight was 2,516 grams or 5 pounds 8 ounces, in the 10th  percentile.  The AFI was borderline at 8.4.  The patient had an NST, which  was reactive and her urine was sent for urine culture because it was  nonspecific.  The patient could not stay, left and was asked to return on  Monday whereby an AFI and NST were repeated.  The AFI had dropped to 6.7,  now in the third percentile at [redacted] weeks gestation.  The fetus was in the  vertex presentation.  The NST was reactive and this is the reason she is  being admitted for induction.  Her cervix is 1 cm and posterior.   PAST MEDICAL HISTORY:  The patient has a history of:  1. Cystocele.  2. Uterine prolapse.  3. Prior history of cystocele repair by another practitioner in the     community since the  patient had refused at the age of 47 to have a     hysterectomy along with an anterior repair.   The patient, since being pregnant, has had pelvic relation consisting of  cystocele and a second degree uterine prolapse.  Initially, because of the  pressure, she had been given a pessary, which was short-lived.  Prenatally,  the patient received RhoGAM due to her being A negative blood type; and,  otherwise she has done well through most of  her pregnancy.   REVIEW OF SYSTEMS:  Please see [place] form.   PAST HISTORY:  1. The patient has a normal spontaneous vaginal delivery in 1995.  2. In 1998 she had an elective abortion.  3. In 2002 she had a normal  spontaneous vaginal delivery.   ALLERGIES:  The patient denies any allergies.   PAST SURGICAL HISTORY:  The patient has had previous cystocele repair and  she had a breast reduction in the past as well as tonsillectomy.   PHYSICAL EXAMINATION:  VITAL SIGNS: Blood pressure is 100/70.  Urine is  trace protein and negative sugar.  Weight 151 pounds.  HEENT: Unremarkable.  NECK:  Neck supple.  Trachea midline.  No carotid bruits.  No thyromegaly.  LUNGS: Lungs cleat to auscultation without rhonchi or wheezes.  HEART:  Regular rate and rhythm.  No murmurs or gallops.  BREASTS: Breast exam not done.  ABDOMEN:  Gravid uterus.  Vertex presentation by Leopold's maneuver and  confirmed by ultrasound.  Fundal height 35-36 cm.  PELVIC EXAMINATION:  Cervic 1 cm, long, closed and posterior.  EXTREMITIES: DTRs.  Negative clonus.  No edema.   PRENATAL LABORATORY DATA:  A negative blood type.  Negative antibody screen.  VDRL was nonreactive.  Rubella immune.  Hepatitis B surface antigen and HIV  were negative.  Alpha fetoprotein was normal.  Diabetes screen was normal.  GBS culture was obtained a few days ago and is not available at the time of  this dictation, but the urine culture did demonstrate there was positive  Staph.   ASSESSMENT:  A  40 year old gravida 4, para 2, AB 1 at [redacted] weeks gestation  with evidence of intrauterine growth retardation and oligohydramnios.   The patient will be admitted today for Cervidil for cervical ripening.  After 12 hours and if the fetal heart rate monitor is reassuring we will  proceed then with Pitocin augmentation.  This patient is wit a history of  uterine prolapse and prior history of cystocele.  GBS culture result not  available; and, due to the fact that recent urine culture was with positive  Staph the patient will be started on pen-G per protocol consisting of 500 mU  IV initially followed by 2.5 mU q.4 hours.   The risks, benefits, pros and cons of induction were discussed with the  patient.  All questions were answered and we will follow accordingly.   PLAN:  As per assessment above.                                                 Juan H. Lily Peer, M.D.    JHF/MEDQ  D:  07/28/2003  T:  07/29/2003  Job:  [id number]

## 2011-03-18 NOTE — Discharge Summary (Signed)
[name], Rachel Ochoa                            ACCOUNT NO.:  [id number]   MEDICAL RECORD NO.:  [id number]                   PATIENT TYPE:  INP   LOCATION:  9304                                 [facility]   PHYSICIAN:  Juan H. Lily Peer, M.D.             DATE OF BIRTH:  1971-05-30   DATE OF ADMISSION:  05/20/2004  DATE OF DISCHARGE:                                 DISCHARGE SUMMARY   TOTAL DAYS HOSPITALIZED:  1   HOSPITAL COURSE:  The patient is a 40 year old gravida 4 para 2 AB 1 who was  admitted on May 20, 2004 whereby she underwent a transvaginal hysterectomy  with Rogelio Seen culdoplasty secondary to symptomatic uterine prolapse.  The  patient had less than 50 mL blood loss in the procedure, received 1200 mL of  lactated Ringers, she had a urine output of 100 mL, and she had received PSA  stockings for DVT prophylaxis and also a gram of Cefotan for prophylaxis as  well.  The patient's postoperative day #1 her hemoglobin and hematocrit this  morning were 10.9 and 31.8 with a platelet count of 131,000.  She was  afebrile, she was passing flatus, her Foley catheter had been removed as  well as her PCA pump, and she had already started to ambulate and she  started to pass gas.  Her diet was advanced to a regular diet and on rounds  this afternoon she had showered and had tolerated her regular diet and was  anxious to go home.  She will be discharged home this afternoon with a  prescription of Lortab 7.5/500 to take one p.o. q.4-6h. p.r.n. and also  Motrin 800 mg t.i.d. p.r.n.  She will return to the office in 2 weeks for a  postoperative visit.  Pathology report pending at this time.  The  instructions were provided in Spanish and we will see her for a  postoperative visit as indicated above.                                               Juan H. Lily Peer, M.D.    JHF/MEDQ  D:  05/21/2004  T:  05/21/2004  Job:  [id number]

## 2011-03-18 NOTE — Discharge Summary (Signed)
[name], [name]                            ACCOUNT NO.:  [id number]   MEDICAL RECORD NO.:  [id number]                   PATIENT TYPE:   LOCATION:                                       FACILITY:   PHYSICIAN:  Artist Pais, M.D.                 DATE OF BIRTH:   DATE OF ADMISSION:  06/18/2002  DATE OF DISCHARGE:  06/20/2002                                 DISCHARGE SUMMARY   HISTORY OF PRESENT ILLNESS:  The patient is a 40 year old Hispanic female  gravida 2, para 2 who delivered a baby in December of 2002.  She was sent to  me for evaluation of continued pelvic pain.  She was found to have a  cystocele and gave a history of leaking with coughing or sneezing.  She was  worked up extensively and found to have a very extensive second degree  cystocele with a flattened UV angle.  A ring with support pessary was  replaced with total resolution of her symptoms.  The patient was noted to  have uterine prolapse and she adamantly and very aggressively demanded that  the cystocele be repaired, that she did not want to wear the pessary.  However, she also was adamant that she did not want a hysterectomy and I did  explain that the process of pelvic prolapse does continue and she might  likely require a hysterectomy due to prolapse and that the prolapse, if it  were to continue, could also damage the cystocele repair.  She continued to  adamantly say that she did not desire her uterus to be removed.  I did  discuss her case with a urogynecologist who suggested to repair her  cystocele.  For further details, please see the very extensively dictated  history and physical.   HOSPITAL COURSE:  The patient was admitted and underwent an anterior repair  for a large cystocele for pelvic pain relieved with ring with support  pessary.  For further details, please see the dictated operative report.  Postoperatively the patient had a stable postoperative course.  Postoperative hemoglobin was normal  at 9.7.  Her packing was removed on  postoperative day one.  On postoperative day two she was able to tolerate  her diet without any nausea and she was discharged home.  She was urged to  return to the office in two days for catheter removal and bladder training.  On the night of surgery it should be noted I explained to the patient and  her husband that we were very happy with her cystocele repair but that as  she is only 30 this is an extensive prolapse for one so young and that this  likely could recur.  I did discuss this on numerous occasions quite  extensively with both patient and her husband.  The patient was discharged  on postoperative day two.  She was given a prescription for  Darvocet-N 100  to take one or two q.4-6h. as needed for pain.  She was also urged to  continue her Ortho-Evra patch and to use Milk of Magnesia p.r.n.  constipation.  She was urged to do no heavy lifting and to return in two  days for catheter removal and bladder training.                                               Artist Pais, M.D.    DC/MEDQ  D:  11/03/2002  T:  11/04/2002  Job:  [id number]

## 2011-03-18 NOTE — Op Note (Signed)
Rachel Ochoa, Rachel Ochoa                            ACCOUNT NO.:  [id number]   MEDICAL RECORD NO.:  [id number]                   PATIENT TYPE:  INP   LOCATION:  NA                                   FACILITY:  [facility]   PHYSICIAN:  Juan H. Lily Peer, M.D.             DATE OF BIRTH:  [birth year]   DATE OF PROCEDURE:  05/20/2004  DATE OF DISCHARGE:                                 OPERATIVE REPORT   SURGEON:  Juan H. Lily Peer, M.D.   FIRST ASSISTANT:  Daniel L. Eda Paschal, M.D.   INDICATIONS FOR PROCEDURE:  A 40 year old patient with symptomatic uterine  prolapse, prior history of anterior colporrhaphy, conceived shortly  thereafter, continued to have uterine prolapse and no evidence of cystocele,  rectocele or enterocele and no evidence of stress or urgency incontinence.   PREOPERATIVE DIAGNOSES:  Uterine prolapse.   POSTOPERATIVE DIAGNOSES:  Uterine prolapse.   ANESTHESIA:  General endotracheal anesthesia.   PROCEDURE:  Transvaginal hysterectomy with McCall's culdoplasty.   DESCRIPTION OF PROCEDURE:  After the patient was adequately counseled, she  was taken to the operating room where she underwent a successful general  endotracheal anesthesia.  She had been placed in a high lithotomy position,  the vagina and perineum were prednisone in the usual sterile fashion, Foley  catheter had been inserted, patient had received a gram of Cefotan for  prophylaxis and PSA stockings was also placed for DVT prophylaxis.  A short  weighted speculum was placed into the posterior vaginal vault, two Deaver  retractors were used for exposure.  Two Lahey clamps were utilized to grasp  the anterior and posterior cervix and 1% lidocaine with 1:100,000  epinephrine was infiltrated into the cervicovaginal fold whereby after this  an incision was made circumferentially. The posterior colpotomy was made,  the short weighted bill speculum was changed to a long weighted bill  speculum.  Both uterosacral ligaments  on each side were clamped, cut and  suture ligated with a transfixion stitch of #0 Vicryl suture and tagged. The  anterior colpotomy was made and the remainder of the broad and cardinal  ligaments were serially clamped, cut and suture ligated with #0 Vicryl  suture.  The uterus and cervix was passed off the operative field, submitted  for pathological evaluation. Both triple pedicles were individually secured  with a free tie of #0 Vicryl suture followed by transfixion stitch of #0  Vicryl suture.  Both ovaries appeared to be normal. At this time, inspection  of the vaginal vault demonstrated that she had no evidence of a cystocele,  rectocele or enterocele so closure was started.  The posterior vaginal cuff  was closed with a running locking stitch of #0 Vicryl suture and a McCall  culdoplasty had been established with 2-0 Vicryl suture reefing the  peritoneum incorporating each uterosacral ligament and exiting at 6 o'clock  on the vaginal cuff.  The remainder of the cuff  was then closed with  interrupted sutures of #0 Vicryl suture and the McCall culdoplasty was  secured. The vagina was copiously irrigated with normal saline solution,  patient was transferred to the recovery room with stable vital signs, blood  loss was less then 50 mL and IV fluids was 400 mL of lactated Ringer's.  Urine output is 100 mL and clear.                                               Juan H. Lily Peer, M.D.    JHF/MEDQ  D:  05/20/2004  T:  05/20/2004  Job:  [id number]

## 2011-03-18 NOTE — H&P (Signed)
Rachel Ochoa, Rachel Ochoa                            ACCOUNT NO.:  [id number]   MEDICAL RECORD NO.:  [id number]                   PATIENT TYPE:  INP   LOCATION:  NA                                   FACILITY:  [facility]   PHYSICIAN:  Juan H. Lily Peer, M.D.             DATE OF BIRTH:  10-Jun-1971   DATE OF ADMISSION:  05/20/2004  DATE OF DISCHARGE:                                HISTORY & PHYSICAL   The patient is scheduled for surgery on May 20, 2004 at 0730 hours at  [facility].   CHIEF COMPLAINT:  1. Symptomatic uterine prolapse.  2. Mild recurrent cystocele.   HISTORY:  The patient is a 40 year old gravida 4, para 2, AB-1 who has been  seen in the office on several occasions, before her last pregnancy, during  her pregnancy and afterwards.  She had ongoing symptoms of uterine prolapse.  Several years ago she had a cystocele repair by another practitioner in the  community due to the fact that she had fullness and pressure sensation that  was bothering her at that time. She was adamant about having a hysterectomy  at that time and only had an anterior colporrhaphy.  She denies any form of  urinary incontinence or any urge incontinence, just the pressure sensation  of the uterine prolapse.  When she had been examined in the sitting and  supine position there was no evidence of an enterocele, there appeared to be  moderate uterine descensus with very little if any cystocele or rectocele,  and she has opted to proceed now with a transvaginal hysterectomy with  ovarian conservation and depending on findings at time of surgery, whether  an additional cystocele repair may be needed or not.  The patient has tried  pessaries in the past with minimal resolution to her symptoms as well as  Kegel exercises.   PAST MEDICAL HISTORY:  The patient has had 3 normal spontaneously vaginal  deliveries, 1 D&C.   MEDICATIONS:  In the past the patient has been on Depo Provera oral  contraceptive pill, and transdermal patch for contraception.  She is  currently on no medications except vitamins.   PRIOR SURGERY:  Tonsillectomy; breast reduction, and cystocele repair.   FAMILY HISTORY:  Significant for father with diabetes as well as  hypertension.  A maternal aunt with breast cancer.   PHYSICAL EXAMINATION:  VITAL SIGNS:  The patient weighs approximately 149.5  pounds, 5 feet tall. Blood pressure 100/70.  HEENT:  Unremarkable.  NECK:  Supple, trachea midline, no carotid bruits, no thyromegaly.  LUNGS:  Clear to auscultation without any rales or wheezes.  HEART:  Regular rate and rhythm, no murmurs or gallops.  BREAST EXAM:  Done at the time of her postpartum exam on November, 2004 was  reportedly normal.  ABDOMEN:  Soft, nontender without rebound or guarding.  PELVIC EXAM:  BSU was within normal limits.  Vagina - patient with a small  cystocele most noticeably with patient in the erect position, and also  significant uterine prolapse of a second degree.  There was no evidence of  any enterocele.  RECTAL EXAM:  Unremarkable.   ASSESSMENT:  A 40 year old gravida 4, para 2, AB-1 with prior history of  anterior colporrhaphy, subsequently conceived and now continues to have  symptoms of uterine prolapse.  A few years ago she had decided that she did  not want a hysterectomy so another physician in the community had done  anterior colporrhaphy.  She denies any urinary incontinence or any urge  incontinence, just a pressure sensation.  The patient was counseled as well  as provided literature information on transvaginal hysterectomy and at the  time of her surgery we will decide of she needs additional anterior  colporrhaphy after the hysterectomy has been completed; decision will be  made at that time.  Risk of the operation to include trauma to the bladder,  and to the rectum blood vessels and nerves, in the event of any uncontrolled  hemorrhage or difficulty in  retrieving the uterus, patient is fully aware  that she could end up with a laparoscopic completion of the procedure or  open abdominal approach.  Other risks are infection, although she will  receive prophylaxis antibiotic.  The risk for deep venous thrombosis - she  will have PSA stockings.  In the event of uncontrolled hemorrhage she would  need a blood transfusion or any blood products.  She is fully aware of the  potential risk for anaphylactic reaction, hepatitis and AIDS.  She is also  fully aware that after this procedure is completed she would no longer be  able to have any children.  Her most recent Pap smear in February, 2005 had  demonstrated a benign reactive ______changes, no evidence of any malignancy.  All the above was discussed with the patient in Spanish.  All questions were  answered and will follow accordingly.   PLAN:  The patient is scheduled for transvaginal hysterectomy with ovarian  conservation.  Possible anterior colporrhaphy.                                               Juan H. Lily Peer, M.D.    JHF/MEDQ  D:  05/19/2004  T:  05/20/2004  Job:  [id number]

## 2011-06-02 ENCOUNTER — Other Ambulatory Visit (HOSPITAL_COMMUNITY)
Admission: RE | Admit: 2011-06-02 | Discharge: 2011-06-02 | Disposition: A | Discharge status: Home / Self Care | Payer: Managed Care, Other (non HMO) | Source: Ambulatory Visit | Attending: Gynecology | Admitting: Gynecology

## 2011-06-02 ENCOUNTER — Ambulatory Visit (INDEPENDENT_AMBULATORY_CARE_PROVIDER_SITE_OTHER): Payer: Managed Care, Other (non HMO) | Admitting: Gynecology

## 2011-06-02 ENCOUNTER — Encounter: Payer: Self-pay | Admitting: Gynecology

## 2011-06-02 VITALS — BP 110/70 | Ht 59.0 in | Wt 140.0 lb

## 2011-06-02 DIAGNOSIS — Z833 Family history of diabetes mellitus: Secondary | ICD-10-CM

## 2011-06-02 DIAGNOSIS — R809 Proteinuria, unspecified: Secondary | ICD-10-CM

## 2011-06-02 DIAGNOSIS — Z01419 Encounter for gynecological examination (general) (routine) without abnormal findings: Secondary | ICD-10-CM | POA: Insufficient documentation

## 2011-06-02 DIAGNOSIS — Z13220 Encounter for screening for lipoid disorders: Secondary | ICD-10-CM

## 2011-06-02 DIAGNOSIS — Z0000 Encounter for general adult medical examination without abnormal findings: Secondary | ICD-10-CM

## 2011-06-02 NOTE — Patient Instructions (Signed)
Por favor tomar una tableta de calcio con vitamina D diaria. Examinar los senos una vez al mes. Proxima mammografia a los cuarentas.Dietary therapy for obesity  All topics are updated as new evidence becomes available and our peer review process is complete.  Literature review current through: Jun 2012.  This topic last updated: Oct 04, 2010.  INTRODUCTION - The optimal management of overweight and obesity requires a combination of diet, exercise, and behavioral modification. In addition, some patients eventually require pharmacologic therapy or bariatric surgery. The risk of overweight to the subject should be evaluated before beginning any treatment program. Selection of treatment can then be made using a risk-benefit assessment). The choice of therapy is dependent on several factors including the degree of overweight or obesity and patient preference.  This topic will review the dietary therapy of obesity. Other aspects of treatment are discussed separately. (See "Health hazards associated with obesity in adults" and "Overview of therapy for obesity in adults" and "Drug therapy of obesity" and "Behavioral strategies in the treatment of obesity".) GOALS OF WEIGHT LOSS - It is important to set goals when discussing a dietary weight loss program with an individual patient. An initial weight loss goal of 5 to 7 percent of body weight is realistic for most individuals. The first goal for any overweight individual is to prevent further weight gain and keep body weight stable (within 5 pounds of its current level).  The goal of the clinician is to identify and review with the patient a realistic weight-loss goal. Most patients have a weight loss goal of 30 percent or more below current weight, which is unrealistic [1].  A successful program will lead to a weight loss of more than 5 percent of initial weight [2]. A weight loss of more than 5 percent can reduce risk factors for cardiovascular disease, such as  dyslipidemia, hypertension, and diabetes mellitus [3]. In the Diabetes Prevention Program, a multi-center trial in patients with impaired glucose tolerance, weight loss of 7 percent reduced the rate of progression from impaired glucose tolerance to diabetes by 58 percent [4]. (See "Prediction and prevention of type 2 diabetes mellitus", section on 'Diabetes Prevention Program'.)  Loss of 5 percent of initial body weight and maintenance of this loss is a good medical result, even if the subject does not reach his or her "dream" weight.  Although an extremely difficult goal to achieve, a body mass index (BMI) between 20 and 25 kg/m2 puts the subject in the lowest risk category (table 1 and figure 1). DIETARY ENERGY Rate of weight loss - The rate of weight loss is directly related to the difference between the subject's energy intake and energy requirements. Reducing caloric intake below expenditure results in a predictable initial rate of weight loss that is related to the energy deficit [5,6]. However, prediction of weight loss for an individual subject can be difficult because of marked intersubject variability in initial body composition, adherence, and energy expenditure [5,7]. Food records are often inaccurate. Most normal-weight people under-report what they eat by 10 to 30 percent, while overweight people under-report by 30 percent or more [8]. In addition, energy requirements are influenced by fidgeting, gender, age, and genetic factors [5,6,9]. As examples: Men lose more weight than women of similar height and weight when they comply with eating any given diet because men have more lean body mass, less percent body fat, and therefore higher energy expenditure.  Older subjects of either sex have a lower energy expenditure and therefore lose  weight more slowly than younger subjects; metabolic rate declines by approximately 2 percent per decade (about 100 kcal/decade) [10].  The importance of genetic  factors is illustrated by a study of identical female twin pairs who were overfed to induce weight gain [11]. Twelve twin pairs were overfed by 1000 kcal/day for 84 of 100 days. The degree of weight gain at a constant dietary caloric increment varied widely among the twin pairs (from 4.3 to 13.3 kg), in fact, there was three times the variance for both weight and fat mass among the twin pairs when compared with that within the twin pairs. Approximately 22 kcal/kg is required to maintain a kilogram of body weight in a normal adult. Thus, the expected or calculated energy expenditure for a woman weighing 100 kg is approximately 2200 kcal/day. The variability of 20 percent could give energy needs as high as 2620 kcal/day or as low as 1860 kcal/day. An average deficit of 500 kcal/day should result in an initial weight loss of approximately 0.5 kg/week (1 lb/week). However, after three to six months of weight loss, energy expenditure adaptations occur, which slow the bodyweight response to a given change in energy intake, thereby diminishing ongoing weight loss [7]. There are several methods of formally estimating energy expenditure; we suggest using the WHO criteria (table 2). This method allows a direct estimate of resting metabolic rate (RMR) and calculation of daily energy requirement. The low activity level (1.3 x RMR) includes subjects who lead a sedentary life. The high activity level (1.7 x RMR) applies to those in jobs requiring manual labor or patients with regular daily physical exercise programs [12]. Maintenance of weight loss - It is important for the overweight subject to understand that achieving and maintaining weight loss is made difficult by the reduction in energy expenditure that is induced by weight loss (figure 2) [13]. Weight loss maintenance is also difficult because of changes in the peripheral hormone signals that regulate appetite. Gastrointestinal peptides, such as ghrelin, which stimulates  appetite, and gastric inhibitory polypeptide, which may promote energy storage, increase after diet-induced weight loss. Other circulating mediators that inhibit intake (eg, leptin, peptide YY, cholecystokinin, pancreatic polypeptide) decrease. These hormonal adaptations favoring weight gain persist for at least one year after diet-induced weight loss [14]. (See "Overview of therapy for obesity in adults", section on 'Maintenance of weight loss' and "Pathogenesis of obesity", section on 'Ghrelin'.)  TYPES OF DIETS - The general consensus is that excess intake of calories from any source, associated with a sedentary lifestyle, causes weight gain and obesity. The goal of dietary therapy, therefore, is to decrease energy intake from food. Conventional diets are defined as those below energy requirements but above 800 kcal/day [15]. These diets fall into four groups: Balanced low-calorie diets/portion-controlled diets  Low-fat diets  Low-carbohydrate diets  Mediterranean diet  Fad diets (diets involving unusual combinations of foods or eating sequences) Commercial weight loss programs and internet-based programs are discussed elsewhere. (See "Behavioral strategies in the treatment of obesity".) Balanced low-calorie diets - Planning a diet requires the selection of a caloric intake and then selection of foods to meet this intake. It is desirable to eat foods with adequate nutrients in addition to protein, carbohydrate, and essential fatty acids. Thus, weight-reducing diets should eliminate alcohol, sugar-containing beverages, and most highly concentrated sweets because they rarely contain adequate amounts of other nutrients besides energy. Breakdown of some protein is to be expected during weight loss. When weight increases as a result of overeating, approximately 75 percent  of the extra energy is stored as fat and the remaining 25 percent as lean tissue. If the lean tissue contains 20 percent protein, then 5  percent of the extra weight gain would be protein. Thus, it should be anticipated that during weight loss, at least 5 percent of weight loss will be protein. A desirable feature of any calorie restricted diet, however, is that it results in the lowest possible loss of protein, recognizing that this will not be less than 5 percent of the weight that is lost. Portion-controlled diets - One simple approach to providing a calorie-controlled diet is to use individually packaged foods, such as formula diet drinks using powdered or liquid formula diets, nutrition bars, frozen food, and pre-packaged meals that can be stored at room temperature as the main source of nutrients. Frozen low-calorie meals containing 250 to 350 kcal/package can be a convenient and nutritious way to do this. We have often recommended the use of formula diets or breakfast bars for breakfast, formula diets or a frozen lunch entree for lunch, and a frozen calorie-controlled entree with additional vegetables for dinner. In this way, it is possible to obtain a calorie-controlled 1000 to 1500 kcal per day diet. In one four-year study this approach resulted in early initial weight loss, which then was maintained [16]. I do not recommend the use of formula diets alone because they do not provide adequate nutritional variety. Low-fat diets - Low-fat diets are another standard strategy to help patients lose weight, and almost all dietary guidelines recommend a reduction in the daily intake of fat to 30 percent of energy intake or less [17,18]. In a meta-analysis of trials comparing low-fat diets (typically 20 to 25 percent of energy from fats) with a control group consuming a usual diet or a medium fat diet (usually 35 to 40 percent of energy), there was greater weight loss (approximately 3 kg) with low-fat compared with moderate fat diets [19]. In addition, one report noted that people who successfully keep their weight reduced adopt three strategies,  one of which is eating a lower fat diet [20]. (See "Dietary fat" and "Etiology and natural history of obesity", section on 'Dietary habits'.) A low-fat dietary pattern with healthy carbohydrates is not associated with weight gain. This was illustrated by the [facility] Dietary Modification Trial of [phone] postmenopausal women over age 42 years who were randomly assigned to a dietary intervention that included group and individual sessions to promote a decrease in fat intake and increases in fruit, vegetable, and grain consumption (healthy carbohydrates), but did not include weight loss or caloric restriction goals, or a control group which received only dietary educational materials [21]. After an average of 7.5 years of follow-up, the following results were seen: Women in the intervention group lost weight in the first year (mean of 2.2 kg) and maintained lower weight than the control women at 7.5 years (difference of 1.9 kg at one year, and 0.4 kg at 7.5 years).  No tendency toward weight gain was seen in the intervention group overall, or when stratified by age, ethnicity, or body mass index.  Weight loss was related to the level of fat intake and was greatest in women who decreased their percentage of energy from fat the most. A similar, but lesser trend was seen with increased vegetable and fruit intake. A low-fat diet can be implemented in two ways. First, the dietitian can provide the subject with specific menu plans that emphasize the use of reduced fat foods. As  one guideline, if a food "melts" in your mouth, it probably has fat in it. Second, subjects can be instructed in counting fat grams as an alternative to counting calories. Fat has 9.4 kcal/g. It is thus very easy to calculate the number of grams of fat a subject can eat for any given level of energy intake. Many experts recommend keeping calories from fat to below 30 percent of total calories. In practical terms, this means  eating about 33 g of fat for each 1000 calories in the diet. For simplicity, I use 30 g of fat or less for each 1000 kcal. For a 1500-calorie diet, this would mean about 45 g or less of fat, which can be counted using the nutrition information labels on food packages. Low-carbohydrate diets - Proponents of low-carbohydrate diets have argued that the increasing obesity epidemic may be in part due to low-fat, high-carbohydrate diets. But this may be dependent upon the type of carbohydrates that are eaten, such as energy dense snacks and sugar or high fructose containing beverages. The carbohydrate content of the diet is an important determinant of short-term (less than two weeks) weight loss. Low (60 to 130 grams of carbohydrates) and very low-carbohydrate diets (0 to <60 grams) have been popular for many years [15]. Restriction of carbohydrates leads to glycogen mobilization and, if carbohydrate intake is less than 50 g/day, ketosis will develop. Rapid weight loss occurs, primarily due to glycogen breakdown and fluid loss rather than fat loss. Low and very low-carbohydrate diets are more effective for short-term weight loss than low-fat diets, although probably not for long-term weight loss. A meta-analysis of five trials found that the difference in weight loss at six months, favoring the low carbohydrate over low fat diet, was not sustained at 12 months [22]. (See 'Comparison trials' below.) Low-carbohydrate diets may have some other beneficial effects with regard to risk of developing type 2 diabetes mellitus, coronary heart disease, and some cancers, particularly if attention is paid to the type as well as the quantity of carbohydrate. A low-carbohydrate diet can be implemented in two ways, either by reducing the total amount of carbohydrate or by consuming foods with a lower glycemic index or glycemic load (table 3). Glycemic index and load are reviewed separately. (See "Dietary carbohydrates", section on  'Glycemic index'.) If a low-carbohydrate diet is chosen, healthy choices for fat (mono- and polyunsaturated fats) and protein (fish, nuts, legumes, and poultry) should be encouraged because of the association between saturated fat intake and risk of coronary heart disease. During 26 years of follow-up of women in the Nurses' Health Study and 20 years of follow-up of men in the Health Professionals' Follow-up Study, low carbohydrate diets in the highest versus lowest decile for vegetable proteins and fat were associated with lower all-cause mortality (HR 0.80, 95% CI 0.75-0.85) and cardiovascular mortality (HR 0.77, 95% CI 0.68-0.87) [23]. In contrast, low carbohydrate diets in the highest versus lowest decile for animal protein and fat were associated with higher all-cause (HR 1.23, 95% CI 1.11-1.37) and cardiovascular (HR 1.14, 95% CI 1.01-1.29) mortality. (See "Dietary fat" and "Overview of primary prevention of coronary heart disease and stroke", section on 'Healthy diet'.) High protein diets - Some popular books recommend high protein diets [24]. In one trial, low-fat diets with 12 percent and 25 percent protein content were compared. Weight loss over six months was greater with the higher protein diet (9 versus 5 kg), but the difference was no longer significant at 12 and 24 months [25].  Higher protein diets may improve weight maintenance, as illustrated by the results of a study of 60 subjects randomly assigned to a low fat, high protein versus low-fat, high-carbohydrate diet after completing a four week very low calorie diet [26]. Among the subjects who completed the three-month study (n = 48), the high protein diet group had significantly better weight maintenance (between group difference of 2.3 kg). High dietary protein intake, due to its acid-producing load, increases urinary calcium excretion (with potential risk for bone loss and calcium stone formation) [27]. Urinary calcium excretion does appear  to increase when dietary intake of protein increases [27-29], and this could pose a long-term risk for nephrolithiasis. (See "Risk factors for calcium stones in adults", section on 'Dietary risk factors'.) However, two small randomized trials that looked at bone metabolism found evidence that increased dietary protein may decrease bone resorption [28,29]. One of the trials found that increased intestinal absorption of calcium was primarily responsible for the increased urinary excretion of calcium and that the excreted calcium was not coming from bone [29]. Mediterranean diet - The term Mediterranean diet refers to a dietary pattern that is common in [place] areas of the [place] area. Although there is some variation in Mediterranean diets, there are some common components that include a high level of monounsaturated fat relative to saturated; moderate consumption of alcohol, mainly as wine; a high consumption of vegetables, fruits, legumes, and grains; a moderate consumption of milk and dairy products, mostly in the form of cheese; and a relatively low intake of meat and meat products. A meta-analysis of 12 studies involving eight cohorts found that a Mediterranean diet was associated with improved health status and reductions in overall mortality, cardiovascular mortality, cancer mortality, and incidence of Parkinson's disease and Alzheimer's disease [30]. (See "Healthy diet in adults", section on 'Mediterranean diet'.) Very low-calorie diets - Diets with energy levels between 200 and 800 kcal/day are called "very low-calorie diets," while those below 200 kcal/day can be termed starvation diets. The basis for these diets was the notion that the lower the calorie intake the more rapid the weight loss, because the energy withdrawn from body fat stores is a function of the energy deficit. Starvation is the ultimate very low-calorie diet and results in the most rapid weight loss. Although once popular,  starvation diets are now rarely used for treatment of obesity. Very low-calorie diets have not been shown to be superior to conventional diets for long-term weight loss. In a meta-analysis of six trials comparing very low-calorie diets with conventional low-calorie diets, short-term weight loss was greater with very low-calorie diets (16.1 versus 9.7 versus percent of initial weight), but there was no difference in long-term weight loss (6.3 versus 5.0 percent) [31]. As with all diets, very low-calorie diets initially result in substantial protein loss that diminishes with time. Other expected effects include reduction in blood pressure and improvement in hyperglycemia in diabetic patients. Subjects adhering to very low-calorie diets usually have a fall in blood pressure, especially during the first week. Antihypertensive drugs, especially calcium channel blockers and diuretics, should usually be discontinued when a very low calorie diet is begun unless moderate to severe hypertension is present.  Most diabetic patients eating very low-calorie diets have marked improvement in hyperglycemia. Blood glucose concentrations fall within the first one to two weeks, and remain lower as long as the diet is continued. Those patients taking less than 50 units of insulin or an oral hypoglycemic drug will usually be able to discontinue therapy [32].  The side effects of very low-calorie diets include hair loss, thinning of the skin, and coldness. These diets are contraindicated for lactating and pregnant women, and in children who require protein for linear growth. As with all diets, there is increased cholesterol mobilization from peripheral fat stores, thus increasing the risk of gallstones. Very low-calorie diets should be reserved for subjects who require rapid weight loss for a specific purpose, such as surgery. The weight regain when the diet is stopped is often rapid, and it is better to take a more sustainable  approach than to use a method that cannot be sustained. Comparison trials - The impact of specific dietary composition on weight change remains uncertain. When energy from dietary carbohydrates decreases, energy from fat sources tends to increase. The reverse is also true; when energy from dietary fats decreases, energy from carbohydrate sources tends to increase. The debate has mainly centered on whether low-fat or low-carbohydrate diets can better induce weight loss and sustain it over the long-term. Weight loss diets - Initial trials evaluating the effect of type of diet (predominantly low-carbohydrate versus low-fat) on weight loss and other outcomes showed that weight loss at six months was approximately 4 kg greater in the very low-carbohydrate group than in the low-fat group [33-35]. Trials lasting for one year, however, did not find a significant difference in weight loss [34,36,37]. A meta-analysis of five trials (including one study not referenced above) found that the difference in weight loss at six months, favoring the low carbohydrate over low fat diet, was not sustained at 12 months [22]. In one study, this convergence was mainly due to regain of weight in the low-carbohydrate group [34]; in another, the convergence was due to ongoing weight loss in the low-fat group (figure 3) [36]. Some of these initial comparison trials of different dietary regimens had important limitations [22]. These included high dropout rates (21 to 48 percent), suboptimal dietary compliance, and limited long-term follow-up. Subsequent trials are larger, of longer duration (lasting one to two years), and have conflicting results with regard to the impact of macronutrient composition on weight loss [38-41]. In contrast, all trials found that dietary adherence is an important determinant of weight loss, independent of macronutrient composition. The following observations illustrate the range of findings in these trials: In  one trial, 322 moderately obese subjects (86 percent men) were randomly assigned to a low-fat (restricted calorie), Mediterranean (moderate-fat, restricted calorie, rich in vegetables, low in red meat), or low-carbohydrate (non-restricted-calorie) diet for two years [38]. Adherence rates were higher than those reported in previous trials (95.4 and 84.6 percent at one and two years, respectively). Weight loss was greater with the Mediterranean and low-carbohydrate diets than the low-fat diet (mean weight loss 4.4, 4.7, and 2.9 kg, respectively).  The most favorable effect on lipids (increased HDL and decreased triglycerides and ratio of total cholesterol to HDL) was seen in the low-carbohydrate group. Among subjects with type 2 diabetes, the greatest improvement in glycemic control occurred with the Mediterranean diet. Among all groups, weight loss was greater for those who completed the two year study than for those who withdrew.  Another randomized trial compared four different diets in 311 overweight and obese premenopausal women: very low-carbohydrate (Atkins); macronutrient balance controlling glycemic load (Zone); general calorie restriction, low-fat (LEARN); and very low-fat (Ornish) [39]. In the intention-to-treat analysis at one year, mean weight loss was greater in the Atkins diet group compared with the other groups (4.7, 1.6, 2.2, and 2.6 kg, respectively). Pairwise comparisons showed  a significant difference only for Atkins versus Zone.  The most favorable effect on triglycerides and HDL-C was seen in the Atkins group. Dietary adherence rates (77 to 88 percent) were similar among the groups and better than in previous trials. Within each group, adherence was significantly associated with weight loss [42].  In the largest trial to date, 811 overweight and obese adults were randomly assigned to one of four diets based upon macronutrient content: low or high fat (20 to 40 percent), which provided  carbohydrate at 35, 45, 55, or 65 percent, and high or average protein (15 to 25 percent) [40]. After six months, mean weight loss in each group was 6 kg. By two years, mean weight loss was 3 to 4 kg, and weight losses remained similar in all groups. Many participants had trouble attaining target levels of macronutrients. Subjects who attended the greatest number of group sessions (most adherent) lost the most weight. Thus, any diet that is adhered to will produce modest weight loss, but adherence rates are low with most diets. Although a low-carbohydrate diet may be associated with greater short-term weight loss, superior weight loss in the long-term has not been established. The optimal mix of macronutrients likely depends upon individual factors [43]. A principal determinant of weight loss appears to be the degree of adherence to the diet, irrespective of the particular macronutrient composition [37,39,40,42,44,45]. Thus, we suggest choosing a macronutrient mix based upon patient preferences, which may improve long-term adherence. Behavioral modification to improve dietary compliance with any type of diet may have the greatest impact on long-term weight loss. (See "Behavioral strategies in the treatment of obesity".) Lipids - The observed effects on blood lipids were similar for trials comparing low fat and very low carbohydrate diets [22,33-36,46]; the low-carbohydrate/high-fat diets caused slight increases in HDL, and greater decreases in fasting triglycerides. At 12 to 24 months, however, the favorable effects on HDL persisted [34,36,37,41], while triglyceride levels were either reduced [34,36] or returned to baseline [37]. In a meta-analysis of trials comparing low-carbohydrate and low-fat diet groups, LDL levels were increased in the low-carbohydrate group [22]. There was no clear benefit of either low-fat or low-carbohydrate diet on cardiovascular risks. Favorable changes in HDL cholesterol and  triglycerides should be weighed against potential unfavorable changes in LDL cholesterol.  Side effects - Very low-carbohydrate diets may be associated with more frequent side effects than low-fat diets. In one of the trials noted above, a number of symptoms occurred significantly more frequently in the low-carbohydrate compared to the low-fat diet group [33]. These included constipation (68 versus 35 percent), headache (60 versus 40 percent), halitosis (38 versus 8 percent), muscle cramps (35 versus 7 percent), diarrhea (23 versus 7 percent), general weakness (25 versus 8 percent), and rash (13 versus 0 percent) [33]. Despite the higher rate of symptoms, dropout rates in clinical trials have been similar for low-carbohydrate and low-fat diets [34-36]. Some have raised the concern about ketosis that occurs with very low-carbohydrate diets. There is one case report of an obese patient who presented in severe ketoacidosis, having lost 9 kg in one month on the Atkins diet, with intake restricted to meat, cheese, and salads [47]. Aside from her diet and possible mild dehydration due to gastroenteritis, no other cause for her ketoacidosis was identified. Weight maintenance diets - Although many individuals have success losing weight with diet, most subsequently regain much or all of the lost weight. Maintaining weight loss is made difficult by the reduction in energy expenditure that is induced  by weight loss. In addition, long-term adherence to restrictive diets is difficult. Exercise and behavioral interventions may help individuals maintain weight loss. These strategies are reviewed in detail elsewhere. (See "Role of physical activity and exercise in obesity", section on 'Maintenance of weight loss' and "Behavioral strategies in the treatment of obesity", section on 'Maintenance of weight loss'.) There is little consensus on the optimal mix of macronutrients to maintain weight loss. The satiating effects of high  protein, low glycemic index diets have generated interest in manipulating protein composition and glycemic index in weight maintenance diets. (See 'High protein diets' above and "Dietary carbohydrates", section on 'Effect of glycemic index/glycemic load'.) In a multicenter trial of five ad libitum diets to prevent weight regain over 26 weeks, 773 adults who had successfully lost 8 percent of their body weight on a low calorie diet (800 to 1000 kcal/day), were randomly assigned in a two-by-two factorial design to a high or low-protein (25 versus 13 percent of total calories), high or low-glycemic index, or to a control diet (moderate protein content) [48]. All diets had a moderate fat content (25 to 30 percent). The achieved protein content was 5 percentage points higher in the high versus low protein groups, and the mean glycemic index was five units lower in the low-glycemic versus high-glycemic index groups. In the intention-to-treat analysis, weight regain during the trial was modestly but significantly greater in the low versus high-protein groups (mean difference 0.93 kg) and in the high versus low-glycemic index groups (mean difference 0.95 kg). Only subjects in the high-protein, low-glycemic index diet group continued to lose weight (mean change -0.38 kg). The trial was limited by the moderate dropout rate (29 percent) and short-term follow-up (six months). Whether a low glycemic index, high protein diet is associated with long-term weight maintenance is unknown. As discussed above, long-term adherence to a weight maintaining diet is probably the most important determinant of success, and therefore the optimal weight maintaining diet will depend upon preference and individual factors. Role of dietary counseling - Dietary counseling may produce modest, short-term weight losses. This topic is reviewed in detail elsewhere. (See "Behavioral strategies in the treatment of obesity", section on 'Elements of  behavioral strategies' and "Behavioral strategies in the treatment of obesity", section on 'Efficacy'.)  Prolonged caloric restriction and longevity - Prolonged caloric restriction improves longevity in rodents and non-human primates [49], but it is not known if the same is true in humans. It is hypothesized that the antiaging effects of caloric restriction are due to reduced energy expenditure resulting in a reduction in production of reactive oxygen species (and therefore a reduction in oxidative damage). In addition, other metabolic effects associated with caloric restriction, such as improved insulin sensitivity, might also have an antiaging effect. In one trial of 48 sedentary, overweight men and women, six months of caloric restriction, with or without exercise, resulted in significant weight loss as expected [50]. In addition, calorie restriction-mediated reductions in fasting insulin concentrations, core body temperature, serum T3 levels, and oxidative damage to DNA (as reflected by a reduction in DNA fragmentation) were seen, suggesting a possible antiaging effect of the prolonged caloric restriction. INFORMATION FOR PATIENTS - [employer]To[employer] offers two types of patient education materials, "The Basics" and "Beyond the Basics." The Basics patient education pieces are written in plain language, at the 5th to 6th grade reading level, and they answer the four or five key questions a patient might have about a given condition. These articles are best for patients who want a  general overview and who prefer short, easy-to-read materials. Beyond the Basics patient education pieces are longer, more sophisticated, and more detailed. These articles are written at the 10th to 12th grade reading level and are best for patients who want in-depth information and are comfortable with some medical jargon. Here are the patient education articles that are relevant to this topic. We encourage you to print or e-mail these topics  to your patients. (You can also locate patient education articles on a variety of subjects by searching on "patient info" and the keyword(s) of interest.)  Basics topics (see "Patient information: Diet and health (The Basics)" and "Patient information: Weight loss treatments (The Basics)")  Beyond the Basics topics (see "Patient information: Diet and health (Beyond the Basics)" and "Patient information: Weight loss treatments (Beyond the Basics)" and "Patient information: Weight loss surgery (Beyond the Basics)")  SUMMARY AND RECOMMENDATIONS An initial weight loss goal of 5 to 7 percent of body weight is realistic for most individuals. (See 'Goals of weight loss' above.)  Many types of diets produce modest weight loss. Options include balanced low-calorie, low-fat low-calorie, moderate-fat low calorie, low-carbohydrate diets, and the Mediterranean diet. Dietary adherence is an important predictor of weight loss, irrespective of the type of diet. (See 'Types of diets' above.)  We suggest tailoring a diet that reduces energy intake below energy expenditure to individual patient preferences, rather than focusing on the macronutrient composition of the diet (Grade 2B). (See 'Comparison trials' above.)  If a low-carbohydrate diet is chosen, healthy choices for fat (mono and polyunsaturated) and protein (fish, nuts, legumes, and poultry) should be encouraged. If a low-fat diet is chosen, the decrease in fat should be accompanied by increases in healthy carbohydrates (fruits, vegetables, whole grains).  Dietary therapy for obesity  All topics are updated as new evidence becomes available and our peer review process is complete.  Literature review current through: Jun 2012.  This topic last updated: Oct 04, 2010.  INTRODUCTION - The optimal management of overweight and obesity requires a combination of diet, exercise, and behavioral modification. In addition, some patients eventually require pharmacologic  therapy or bariatric surgery. The risk of overweight to the subject should be evaluated before beginning any treatment program. Selection of treatment can then be made using a risk-benefit assessment). The choice of therapy is dependent on several factors including the degree of overweight or obesity and patient preference.  This topic will review the dietary therapy of obesity. Other aspects of treatment are discussed separately. (See "Health hazards associated with obesity in adults" and "Overview of therapy for obesity in adults" and "Drug therapy of obesity" and "Behavioral strategies in the treatment of obesity".) GOALS OF WEIGHT LOSS - It is important to set goals when discussing a dietary weight loss program with an individual patient. An initial weight loss goal of 5 to 7 percent of body weight is realistic for most individuals. The first goal for any overweight individual is to prevent further weight gain and keep body weight stable (within 5 pounds of its current level).  The goal of the clinician is to identify and review with the patient a realistic weight-loss goal. Most patients have a weight loss goal of 30 percent or more below current weight, which is unrealistic [1].  A successful program will lead to a weight loss of more than 5 percent of initial weight [2]. A weight loss of more than 5 percent can reduce risk factors for cardiovascular disease, such as dyslipidemia, hypertension, and diabetes mellitus [  3]. In the Diabetes Prevention Program, a multi-center trial in patients with impaired glucose tolerance, weight loss of 7 percent reduced the rate of progression from impaired glucose tolerance to diabetes by 58 percent [4]. (See "Prediction and prevention of type 2 diabetes mellitus", section on 'Diabetes Prevention Program'.)  Loss of 5 percent of initial body weight and maintenance of this loss is a good medical result, even if the subject does not reach his or her "dream" weight.    Although an extremely difficult goal to achieve, a body mass index (BMI) between 20 and 25 kg/m2 puts the subject in the lowest risk category (table 1 and figure 1). DIETARY ENERGY Rate of weight loss - The rate of weight loss is directly related to the difference between the subject's energy intake and energy requirements. Reducing caloric intake below expenditure results in a predictable initial rate of weight loss that is related to the energy deficit [5,6]. However, prediction of weight loss for an individual subject can be difficult because of marked intersubject variability in initial body composition, adherence, and energy expenditure [5,7]. Food records are often inaccurate. Most normal-weight people under-report what they eat by 10 to 30 percent, while overweight people under-report by 30 percent or more [8]. In addition, energy requirements are influenced by fidgeting, gender, age, and genetic factors [5,6,9]. As examples: Men lose more weight than women of similar height and weight when they comply with eating any given diet because men have more lean body mass, less percent body fat, and therefore higher energy expenditure.  Older subjects of either sex have a lower energy expenditure and therefore lose weight more slowly than younger subjects; metabolic rate declines by approximately 2 percent per decade (about 100 kcal/decade) [10].  The importance of genetic factors is illustrated by a study of identical female twin pairs who were overfed to induce weight gain [11]. Twelve twin pairs were overfed by 1000 kcal/day for 84 of 100 days. The degree of weight gain at a constant dietary caloric increment varied widely among the twin pairs (from 4.3 to 13.3 kg), in fact, there was three times the variance for both weight and fat mass among the twin pairs when compared with that within the twin pairs. Approximately 22 kcal/kg is required to maintain a kilogram of body weight in a normal adult. Thus, the  expected or calculated energy expenditure for a woman weighing 100 kg is approximately 2200 kcal/day. The variability of 20 percent could give energy needs as high as 2620 kcal/day or as low as 1860 kcal/day. An average deficit of 500 kcal/day should result in an initial weight loss of approximately 0.5 kg/week (1 lb/week). However, after three to six months of weight loss, energy expenditure adaptations occur, which slow the bodyweight response to a given change in energy intake, thereby diminishing ongoing weight loss [7]. There are several methods of formally estimating energy expenditure; we suggest using the WHO criteria (table 2). This method allows a direct estimate of resting metabolic rate (RMR) and calculation of daily energy requirement. The low activity level (1.3 x RMR) includes subjects who lead a sedentary life. The high activity level (1.7 x RMR) applies to those in jobs requiring manual labor or patients with regular daily physical exercise programs [12]. Maintenance of weight loss - It is important for the overweight subject to understand that achieving and maintaining weight loss is made difficult by the reduction in energy expenditure that is induced by weight loss (figure 2) [13]. Weight loss maintenance  is also difficult because of changes in the peripheral hormone signals that regulate appetite. Gastrointestinal peptides, such as ghrelin, which stimulates appetite, and gastric inhibitory polypeptide, which may promote energy storage, increase after diet-induced weight loss. Other circulating mediators that inhibit intake (eg, leptin, peptide YY, cholecystokinin, pancreatic polypeptide) decrease. These hormonal adaptations favoring weight gain persist for at least one year after diet-induced weight loss [14]. (See "Overview of therapy for obesity in adults", section on 'Maintenance of weight loss' and "Pathogenesis of obesity", section on 'Ghrelin'.)  TYPES OF DIETS - The general consensus  is that excess intake of calories from any source, associated with a sedentary lifestyle, causes weight gain and obesity. The goal of dietary therapy, therefore, is to decrease energy intake from food. Conventional diets are defined as those below energy requirements but above 800 kcal/day [15]. These diets fall into four groups: Balanced low-calorie diets/portion-controlled diets  Low-fat diets  Low-carbohydrate diets  Mediterranean diet  Fad diets (diets involving unusual combinations of foods or eating sequences) Commercial weight loss programs and internet-based programs are discussed elsewhere. (See "Behavioral strategies in the treatment of obesity".) Balanced low-calorie diets - Planning a diet requires the selection of a caloric intake and then selection of foods to meet this intake. It is desirable to eat foods with adequate nutrients in addition to protein, carbohydrate, and essential fatty acids. Thus, weight-reducing diets should eliminate alcohol, sugar-containing beverages, and most highly concentrated sweets because they rarely contain adequate amounts of other nutrients besides energy. Breakdown of some protein is to be expected during weight loss. When weight increases as a result of overeating, approximately 75 percent of the extra energy is stored as fat and the remaining 25 percent as lean tissue. If the lean tissue contains 20 percent protein, then 5 percent of the extra weight gain would be protein. Thus, it should be anticipated that during weight loss, at least 5 percent of weight loss will be protein. A desirable feature of any calorie restricted diet, however, is that it results in the lowest possible loss of protein, recognizing that this will not be less than 5 percent of the weight that is lost. Portion-controlled diets - One simple approach to providing a calorie-controlled diet is to use individually packaged foods, such as formula diet drinks using powdered or liquid formula  diets, nutrition bars, frozen food, and pre-packaged meals that can be stored at room temperature as the main source of nutrients. Frozen low-calorie meals containing 250 to 350 kcal/package can be a convenient and nutritious way to do this. We have often recommended the use of formula diets or breakfast bars for breakfast, formula diets or a frozen lunch entree for lunch, and a frozen calorie-controlled entree with additional vegetables for dinner. In this way, it is possible to obtain a calorie-controlled 1000 to 1500 kcal per day diet. In one four-year study this approach resulted in early initial weight loss, which then was maintained [16]. I do not recommend the use of formula diets alone because they do not provide adequate nutritional variety. Low-fat diets - Low-fat diets are another standard strategy to help patients lose weight, and almost all dietary guidelines recommend a reduction in the daily intake of fat to 30 percent of energy intake or less [17,18]. In a meta-analysis of trials comparing low-fat diets (typically 20 to 25 percent of energy from fats) with a control group consuming a usual diet or a medium fat diet (usually 35 to 40 percent of energy), there was greater weight loss (  approximately 3 kg) with low-fat compared with moderate fat diets [19]. In addition, one report noted that people who successfully keep their weight reduced adopt three strategies, one of which is eating a lower fat diet [20]. (See "Dietary fat" and "Etiology and natural history of obesity", section on 'Dietary habits'.) A low-fat dietary pattern with healthy carbohydrates is not associated with weight gain. This was illustrated by the [facility] Dietary Modification Trial of [phone] postmenopausal women over age 65 years who were randomly assigned to a dietary intervention that included group and individual sessions to promote a decrease in fat intake and increases in fruit, vegetable, and grain  consumption (healthy carbohydrates), but did not include weight loss or caloric restriction goals, or a control group which received only dietary educational materials [21]. After an average of 7.5 years of follow-up, the following results were seen: Women in the intervention group lost weight in the first year (mean of 2.2 kg) and maintained lower weight than the control women at 7.5 years (difference of 1.9 kg at one year, and 0.4 kg at 7.5 years).  No tendency toward weight gain was seen in the intervention group overall, or when stratified by age, ethnicity, or body mass index.  Weight loss was related to the level of fat intake and was greatest in women who decreased their percentage of energy from fat the most. A similar, but lesser trend was seen with increased vegetable and fruit intake. A low-fat diet can be implemented in two ways. First, the dietitian can provide the subject with specific menu plans that emphasize the use of reduced fat foods. As one guideline, if a food "melts" in your mouth, it probably has fat in it. Second, subjects can be instructed in counting fat grams as an alternative to counting calories. Fat has 9.4 kcal/g. It is thus very easy to calculate the number of grams of fat a subject can eat for any given level of energy intake. Many experts recommend keeping calories from fat to below 30 percent of total calories. In practical terms, this means eating about 33 g of fat for each 1000 calories in the diet. For simplicity, I use 30 g of fat or less for each 1000 kcal. For a 1500-calorie diet, this would mean about 45 g or less of fat, which can be counted using the nutrition information labels on food packages. Low-carbohydrate diets - Proponents of low-carbohydrate diets have argued that the increasing obesity epidemic may be in part due to low-fat, high-carbohydrate diets. But this may be dependent upon the type of carbohydrates that are eaten, such as energy dense snacks and  sugar or high fructose containing beverages. The carbohydrate content of the diet is an important determinant of short-term (less than two weeks) weight loss. Low (60 to 130 grams of carbohydrates) and very low-carbohydrate diets (0 to <60 grams) have been popular for many years [15]. Restriction of carbohydrates leads to glycogen mobilization and, if carbohydrate intake is less than 50 g/day, ketosis will develop. Rapid weight loss occurs, primarily due to glycogen breakdown and fluid loss rather than fat loss. Low and very low-carbohydrate diets are more effective for short-term weight loss than low-fat diets, although probably not for long-term weight loss. A meta-analysis of five trials found that the difference in weight loss at six months, favoring the low carbohydrate over low fat diet, was not sustained at 12 months [22]. (See 'Comparison trials' below.) Low-carbohydrate diets may have some other beneficial effects with regard to  risk of developing type 2 diabetes mellitus, coronary heart disease, and some cancers, particularly if attention is paid to the type as well as the quantity of carbohydrate. A low-carbohydrate diet can be implemented in two ways, either by reducing the total amount of carbohydrate or by consuming foods with a lower glycemic index or glycemic load (table 3). Glycemic index and load are reviewed separately. (See "Dietary carbohydrates", section on 'Glycemic index'.) If a low-carbohydrate diet is chosen, healthy choices for fat (mono- and polyunsaturated fats) and protein (fish, nuts, legumes, and poultry) should be encouraged because of the association between saturated fat intake and risk of coronary heart disease. During 26 years of follow-up of women in the Nurses' Health Study and 20 years of follow-up of men in the Health Professionals' Follow-up Study, low carbohydrate diets in the highest versus lowest decile for vegetable proteins and fat were associated with lower  all-cause mortality (HR 0.80, 95% CI 0.75-0.85) and cardiovascular mortality (HR 0.77, 95% CI 0.68-0.87) [23]. In contrast, low carbohydrate diets in the highest versus lowest decile for animal protein and fat were associated with higher all-cause (HR 1.23, 95% CI 1.11-1.37) and cardiovascular (HR 1.14, 95% CI 1.01-1.29) mortality. (See "Dietary fat" and "Overview of primary prevention of coronary heart disease and stroke", section on 'Healthy diet'.) High protein diets - Some popular books recommend high protein diets [24]. In one trial, low-fat diets with 12 percent and 25 percent protein content were compared. Weight loss over six months was greater with the higher protein diet (9 versus 5 kg), but the difference was no longer significant at 12 and 24 months [25]. Higher protein diets may improve weight maintenance, as illustrated by the results of a study of 60 subjects randomly assigned to a low fat, high protein versus low-fat, high-carbohydrate diet after completing a four week very low calorie diet [26]. Among the subjects who completed the three-month study (n = 48), the high protein diet group had significantly better weight maintenance (between group difference of 2.3 kg). High dietary protein intake, due to its acid-producing load, increases urinary calcium excretion (with potential risk for bone loss and calcium stone formation) [27]. Urinary calcium excretion does appear to increase when dietary intake of protein increases [27-29], and this could pose a long-term risk for nephrolithiasis. (See "Risk factors for calcium stones in adults", section on 'Dietary risk factors'.) However, two small randomized trials that looked at bone metabolism found evidence that increased dietary protein may decrease bone resorption [28,29]. One of the trials found that increased intestinal absorption of calcium was primarily responsible for the increased urinary excretion of calcium and that the excreted calcium was  not coming from bone [29]. Mediterranean diet - The term Mediterranean diet refers to a dietary pattern that is common in [place] areas of the [place] area. Although there is some variation in Mediterranean diets, there are some common components that include a high level of monounsaturated fat relative to saturated; moderate consumption of alcohol, mainly as wine; a high consumption of vegetables, fruits, legumes, and grains; a moderate consumption of milk and dairy products, mostly in the form of cheese; and a relatively low intake of meat and meat products. A meta-analysis of 12 studies involving eight cohorts found that a Mediterranean diet was associated with improved health status and reductions in overall mortality, cardiovascular mortality, cancer mortality, and incidence of Parkinson's disease and Alzheimer's disease [30]. (See "Healthy diet in adults", section on 'Mediterranean diet'.) Very low-calorie diets - Diets with energy levels  between 200 and 800 kcal/day are called "very low-calorie diets," while those below 200 kcal/day can be termed starvation diets. The basis for these diets was the notion that the lower the calorie intake the more rapid the weight loss, because the energy withdrawn from body fat stores is a function of the energy deficit. Starvation is the ultimate very low-calorie diet and results in the most rapid weight loss. Although once popular, starvation diets are now rarely used for treatment of obesity. Very low-calorie diets have not been shown to be superior to conventional diets for long-term weight loss. In a meta-analysis of six trials comparing very low-calorie diets with conventional low-calorie diets, short-term weight loss was greater with very low-calorie diets (16.1 versus 9.7 versus percent of initial weight), but there was no difference in long-term weight loss (6.3 versus 5.0 percent) [31]. As with all diets, very low-calorie diets initially result in  substantial protein loss that diminishes with time. Other expected effects include reduction in blood pressure and improvement in hyperglycemia in diabetic patients. Subjects adhering to very low-calorie diets usually have a fall in blood pressure, especially during the first week. Antihypertensive drugs, especially calcium channel blockers and diuretics, should usually be discontinued when a very low calorie diet is begun unless moderate to severe hypertension is present.  Most diabetic patients eating very low-calorie diets have marked improvement in hyperglycemia. Blood glucose concentrations fall within the first one to two weeks, and remain lower as long as the diet is continued. Those patients taking less than 50 units of insulin or an oral hypoglycemic drug will usually be able to discontinue therapy [32]. The side effects of very low-calorie diets include hair loss, thinning of the skin, and coldness. These diets are contraindicated for lactating and pregnant women, and in children who require protein for linear growth. As with all diets, there is increased cholesterol mobilization from peripheral fat stores, thus increasing the risk of gallstones. Very low-calorie diets should be reserved for subjects who require rapid weight loss for a specific purpose, such as surgery. The weight regain when the diet is stopped is often rapid, and it is better to take a more sustainable approach than to use a method that cannot be sustained. Comparison trials - The impact of specific dietary composition on weight change remains uncertain. When energy from dietary carbohydrates decreases, energy from fat sources tends to increase. The reverse is also true; when energy from dietary fats decreases, energy from carbohydrate sources tends to increase. The debate has mainly centered on whether low-fat or low-carbohydrate diets can better induce weight loss and sustain it over the long-term. Weight loss diets - Initial  trials evaluating the effect of type of diet (predominantly low-carbohydrate versus low-fat) on weight loss and other outcomes showed that weight loss at six months was approximately 4 kg greater in the very low-carbohydrate group than in the low-fat group [33-35]. Trials lasting for one year, however, did not find a significant difference in weight loss [34,36,37]. A meta-analysis of five trials (including one study not referenced above) found that the difference in weight loss at six months, favoring the low carbohydrate over low fat diet, was not sustained at 12 months [22]. In one study, this convergence was mainly due to regain of weight in the low-carbohydrate group [34]; in another, the convergence was due to ongoing weight loss in the low-fat group (figure 3) [36]. Some of these initial comparison trials of different dietary regimens had important limitations [22]. These included high dropout  rates (21 to 48 percent), suboptimal dietary compliance, and limited long-term follow-up. Subsequent trials are larger, of longer duration (lasting one to two years), and have conflicting results with regard to the impact of macronutrient composition on weight loss [38-41]. In contrast, all trials found that dietary adherence is an important determinant of weight loss, independent of macronutrient composition. The following observations illustrate the range of findings in these trials: In one trial, 322 moderately obese subjects (86 percent men) were randomly assigned to a low-fat (restricted calorie), Mediterranean (moderate-fat, restricted calorie, rich in vegetables, low in red meat), or low-carbohydrate (non-restricted-calorie) diet for two years [38]. Adherence rates were higher than those reported in previous trials (95.4 and 84.6 percent at one and two years, respectively). Weight loss was greater with the Mediterranean and low-carbohydrate diets than the low-fat diet (mean weight loss 4.4, 4.7, and 2.9 kg,  respectively).  The most favorable effect on lipids (increased HDL and decreased triglycerides and ratio of total cholesterol to HDL) was seen in the low-carbohydrate group. Among subjects with type 2 diabetes, the greatest improvement in glycemic control occurred with the Mediterranean diet. Among all groups, weight loss was greater for those who completed the two year study than for those who withdrew.  Another randomized trial compared four different diets in 311 overweight and obese premenopausal women: very low-carbohydrate (Atkins); macronutrient balance controlling glycemic load (Zone); general calorie restriction, low-fat (LEARN); and very low-fat (Ornish) [39]. In the intention-to-treat analysis at one year, mean weight loss was greater in the Atkins diet group compared with the other groups (4.7, 1.6, 2.2, and 2.6 kg, respectively). Pairwise comparisons showed a significant difference only for Atkins versus Zone.  The most favorable effect on triglycerides and HDL-C was seen in the Atkins group. Dietary adherence rates (77 to 88 percent) were similar among the groups and better than in previous trials. Within each group, adherence was significantly associated with weight loss [42].  In the largest trial to date, 811 overweight and obese adults were randomly assigned to one of four diets based upon macronutrient content: low or high fat (20 to 40 percent), which provided carbohydrate at 35, 45, 55, or 65 percent, and high or average protein (15 to 25 percent) [40]. After six months, mean weight loss in each group was 6 kg. By two years, mean weight loss was 3 to 4 kg, and weight losses remained similar in all groups. Many participants had trouble attaining target levels of macronutrients. Subjects who attended the greatest number of group sessions (most adherent) lost the most weight. Thus, any diet that is adhered to will produce modest weight loss, but adherence rates are low with most diets.  Although a low-carbohydrate diet may be associated with greater short-term weight loss, superior weight loss in the long-term has not been established. The optimal mix of macronutrients likely depends upon individual factors [43]. A principal determinant of weight loss appears to be the degree of adherence to the diet, irrespective of the particular macronutrient composition [37,39,40,42,44,45]. Thus, we suggest choosing a macronutrient mix based upon patient preferences, which may improve long-term adherence. Behavioral modification to improve dietary compliance with any type of diet may have the greatest impact on long-term weight loss. (See "Behavioral strategies in the treatment of obesity".) Lipids - The observed effects on blood lipids were similar for trials comparing low fat and very low carbohydrate diets [22,33-36,46]; the low-carbohydrate/high-fat diets caused slight increases in HDL, and greater decreases in fasting triglycerides. At 12 to 24 months, however,  the favorable effects on HDL persisted [34,36,37,41], while triglyceride levels were either reduced [34,36] or returned to baseline [37]. In a meta-analysis of trials comparing low-carbohydrate and low-fat diet groups, LDL levels were increased in the low-carbohydrate group [22]. There was no clear benefit of either low-fat or low-carbohydrate diet on cardiovascular risks. Favorable changes in HDL cholesterol and triglycerides should be weighed against potential unfavorable changes in LDL cholesterol.  Side effects - Very low-carbohydrate diets may be associated with more frequent side effects than low-fat diets. In one of the trials noted above, a number of symptoms occurred significantly more frequently in the low-carbohydrate compared to the low-fat diet group [33]. These included constipation (68 versus 35 percent), headache (60 versus 40 percent), halitosis (38 versus 8 percent), muscle cramps (35 versus 7 percent), diarrhea (23 versus 7  percent), general weakness (25 versus 8 percent), and rash (13 versus 0 percent) [33]. Despite the higher rate of symptoms, dropout rates in clinical trials have been similar for low-carbohydrate and low-fat diets [34-36]. Some have raised the concern about ketosis that occurs with very low-carbohydrate diets. There is one case report of an obese patient who presented in severe ketoacidosis, having lost 9 kg in one month on the Atkins diet, with intake restricted to meat, cheese, and salads [47]. Aside from her diet and possible mild dehydration due to gastroenteritis, no other cause for her ketoacidosis was identified. Weight maintenance diets - Although many individuals have success losing weight with diet, most subsequently regain much or all of the lost weight. Maintaining weight loss is made difficult by the reduction in energy expenditure that is induced by weight loss. In addition, long-term adherence to restrictive diets is difficult. Exercise and behavioral interventions may help individuals maintain weight loss. These strategies are reviewed in detail elsewhere. (See "Role of physical activity and exercise in obesity", section on 'Maintenance of weight loss' and "Behavioral strategies in the treatment of obesity", section on 'Maintenance of weight loss'.) There is little consensus on the optimal mix of macronutrients to maintain weight loss. The satiating effects of high protein, low glycemic index diets have generated interest in manipulating protein composition and glycemic index in weight maintenance diets. (See 'High protein diets' above and "Dietary carbohydrates", section on 'Effect of glycemic index/glycemic load'.) In a multicenter trial of five ad libitum diets to prevent weight regain over 26 weeks, 773 adults who had successfully lost 8 percent of their body weight on a low calorie diet (800 to 1000 kcal/day), were randomly assigned in a two-by-two factorial design to a high or low-protein  (25 versus 13 percent of total calories), high or low-glycemic index, or to a control diet (moderate protein content) [48]. All diets had a moderate fat content (25 to 30 percent). The achieved protein content was 5 percentage points higher in the high versus low protein groups, and the mean glycemic index was five units lower in the low-glycemic versus high-glycemic index groups. In the intention-to-treat analysis, weight regain during the trial was modestly but significantly greater in the low versus high-protein groups (mean difference 0.93 kg) and in the high versus low-glycemic index groups (mean difference 0.95 kg). Only subjects in the high-protein, low-glycemic index diet group continued to lose weight (mean change -0.38 kg). The trial was limited by the moderate dropout rate (29 percent) and short-term follow-up (six months). Whether a low glycemic index, high protein diet is associated with long-term weight maintenance is unknown. As discussed above, long-term adherence to a weight maintaining diet is  probably the most important determinant of success, and therefore the optimal weight maintaining diet will depend upon preference and individual factors. Role of dietary counseling - Dietary counseling may produce modest, short-term weight losses. This topic is reviewed in detail elsewhere. (See "Behavioral strategies in the treatment of obesity", section on 'Elements of behavioral strategies' and "Behavioral strategies in the treatment of obesity", section on 'Efficacy'.)  Prolonged caloric restriction and longevity - Prolonged caloric restriction improves longevity in rodents and non-human primates [49], but it is not known if the same is true in humans. It is hypothesized that the antiaging effects of caloric restriction are due to reduced energy expenditure resulting in a reduction in production of reactive oxygen species (and therefore a reduction in oxidative damage). In addition, other metabolic  effects associated with caloric restriction, such as improved insulin sensitivity, might also have an antiaging effect. In one trial of 48 sedentary, overweight men and women, six months of caloric restriction, with or without exercise, resulted in significant weight loss as expected [50]. In addition, calorie restriction-mediated reductions in fasting insulin concentrations, core body temperature, serum T3 levels, and oxidative damage to DNA (as reflected by a reduction in DNA fragmentation) were seen, suggesting a possible antiaging effect of the prolonged caloric restriction. INFORMATION FOR PATIENTS - UpToDate offers two types of patient education materials, "The Basics" and "Beyond the Basics." The Basics patient education pieces are written in plain language, at the 5th to 6th grade reading level, and they answer the four or five key questions a patient might have about a given condition. These articles are best for patients who want a general overview and who prefer short, easy-to-read materials. Beyond the Basics patient education pieces are longer, more sophisticated, and more detailed. These articles are written at the 10th to 12th grade reading level and are best for patients who want in-depth information and are comfortable with some medical jargon. Here are the patient education articles that are relevant to this topic. We encourage you to print or e-mail these topics to your patients. (You can also locate patient education articles on a variety of subjects by searching on "patient info" and the keyword(s) of interest.)  Basics topics (see "Patient information: Diet and health (The Basics)" and "Patient information: Weight loss treatments (The Basics)")  Beyond the Basics topics (see "Patient information: Diet and health (Beyond the Basics)" and "Patient information: Weight loss treatments (Beyond the Basics)" and "Patient information: Weight loss surgery (Beyond the Basics)")  SUMMARY AND  RECOMMENDATIONS An initial weight loss goal of 5 to 7 percent of body weight is realistic for most individuals. (See 'Goals of weight loss' above.)  Many types of diets produce modest weight loss. Options include balanced low-calorie, low-fat low-calorie, moderate-fat low calorie, low-carbohydrate diets, and the Mediterranean diet. Dietary adherence is an important predictor of weight loss, irrespective of the type of diet. (See 'Types of diets' above.)  We suggest tailoring a diet that reduces energy intake below energy expenditure to individual patient preferences, rather than focusing on the macronutrient composition of the diet (Grade 2B). (See 'Comparison trials' above.)  If a low-carbohydrate diet is chosen, healthy choices for fat (mono and polyunsaturated) and protein (fish, nuts, legumes, and poultry) should be encouraged. If a low-fat diet is chosen, the decrease in fat should be accompanied by increases in healthy carbohydrates (fruits, vegetables, whole grains).  Dietary therapy for obesity  All topics are updated as new evidence becomes available and our peer review process is complete.  Literature review current through: Jun 2012.  This topic last updated: Oct 04, 2010.  INTRODUCTION - The optimal management of overweight and obesity requires a combination of diet, exercise, and behavioral modification. In addition, some patients eventually require pharmacologic therapy or bariatric surgery. The risk of overweight to the subject should be evaluated before beginning any treatment program. Selection of treatment can then be made using a risk-benefit assessment). The choice of therapy is dependent on several factors including the degree of overweight or obesity and patient preference.  This topic will review the dietary therapy of obesity. Other aspects of treatment are discussed separately. (See "Health hazards associated with obesity in adults" and "Overview of therapy for obesity in adults"  and "Drug therapy of obesity" and "Behavioral strategies in the treatment of obesity".) GOALS OF WEIGHT LOSS - It is important to set goals when discussing a dietary weight loss program with an individual patient. An initial weight loss goal of 5 to 7 percent of body weight is realistic for most individuals. The first goal for any overweight individual is to prevent further weight gain and keep body weight stable (within 5 pounds of its current level).  The goal of the clinician is to identify and review with the patient a realistic weight-loss goal. Most patients have a weight loss goal of 30 percent or more below current weight, which is unrealistic [1].  A successful program will lead to a weight loss of more than 5 percent of initial weight [2]. A weight loss of more than 5 percent can reduce risk factors for cardiovascular disease, such as dyslipidemia, hypertension, and diabetes mellitus [3]. In the Diabetes Prevention Program, a multi-center trial in patients with impaired glucose tolerance, weight loss of 7 percent reduced the rate of progression from impaired glucose tolerance to diabetes by 58 percent [4]. (See "Prediction and prevention of type 2 diabetes mellitus", section on 'Diabetes Prevention Program'.)  Loss of 5 percent of initial body weight and maintenance of this loss is a good medical result, even if the subject does not reach his or her "dream" weight.  Although an extremely difficult goal to achieve, a body mass index (BMI) between 20 and 25 kg/m2 puts the subject in the lowest risk category (table 1 and figure 1). DIETARY ENERGY Rate of weight loss - The rate of weight loss is directly related to the difference between the subject's energy intake and energy requirements. Reducing caloric intake below expenditure results in a predictable initial rate of weight loss that is related to the energy deficit [5,6]. However, prediction of weight loss for an individual subject can be  difficult because of marked intersubject variability in initial body composition, adherence, and energy expenditure [5,7]. Food records are often inaccurate. Most normal-weight people under-report what they eat by 10 to 30 percent, while overweight people under-report by 30 percent or more [8]. In addition, energy requirements are influenced by fidgeting, gender, age, and genetic factors [5,6,9]. As examples: Men lose more weight than women of similar height and weight when they comply with eating any given diet because men have more lean body mass, less percent body fat, and therefore higher energy expenditure.  Older subjects of either sex have a lower energy expenditure and therefore lose weight more slowly than younger subjects; metabolic rate declines by approximately 2 percent per decade (about 100 kcal/decade) [10].  The importance of genetic factors is illustrated by a study of identical female twin pairs who were overfed to induce weight gain [11].  Twelve twin pairs were overfed by 1000 kcal/day for 84 of 100 days. The degree of weight gain at a constant dietary caloric increment varied widely among the twin pairs (from 4.3 to 13.3 kg), in fact, there was three times the variance for both weight and fat mass among the twin pairs when compared with that within the twin pairs. Approximately 22 kcal/kg is required to maintain a kilogram of body weight in a normal adult. Thus, the expected or calculated energy expenditure for a woman weighing 100 kg is approximately 2200 kcal/day. The variability of 20 percent could give energy needs as high as 2620 kcal/day or as low as 1860 kcal/day. An average deficit of 500 kcal/day should result in an initial weight loss of approximately 0.5 kg/week (1 lb/week). However, after three to six months of weight loss, energy expenditure adaptations occur, which slow the bodyweight response to a given change in energy intake, thereby diminishing ongoing weight loss [7]. There  are several methods of formally estimating energy expenditure; we suggest using the WHO criteria (table 2). This method allows a direct estimate of resting metabolic rate (RMR) and calculation of daily energy requirement. The low activity level (1.3 x RMR) includes subjects who lead a sedentary life. The high activity level (1.7 x RMR) applies to those in jobs requiring manual labor or patients with regular daily physical exercise programs [12]. Maintenance of weight loss - It is important for the overweight subject to understand that achieving and maintaining weight loss is made difficult by the reduction in energy expenditure that is induced by weight loss (figure 2) [13]. Weight loss maintenance is also difficult because of changes in the peripheral hormone signals that regulate appetite. Gastrointestinal peptides, such as ghrelin, which stimulates appetite, and gastric inhibitory polypeptide, which may promote energy storage, increase after diet-induced weight loss. Other circulating mediators that inhibit intake (eg, leptin, peptide YY, cholecystokinin, pancreatic polypeptide) decrease. These hormonal adaptations favoring weight gain persist for at least one year after diet-induced weight loss [14]. (See "Overview of therapy for obesity in adults", section on 'Maintenance of weight loss' and "Pathogenesis of obesity", section on 'Ghrelin'.)  TYPES OF DIETS - The general consensus is that excess intake of calories from any source, associated with a sedentary lifestyle, causes weight gain and obesity. The goal of dietary therapy, therefore, is to decrease energy intake from food. Conventional diets are defined as those below energy requirements but above 800 kcal/day [15]. These diets fall into four groups: Balanced low-calorie diets/portion-controlled diets  Low-fat diets  Low-carbohydrate diets  Mediterranean diet  Fad diets (diets involving unusual combinations of foods or eating sequences) Commercial  weight loss programs and internet-based programs are discussed elsewhere. (See "Behavioral strategies in the treatment of obesity".) Balanced low-calorie diets - Planning a diet requires the selection of a caloric intake and then selection of foods to meet this intake. It is desirable to eat foods with adequate nutrients in addition to protein, carbohydrate, and essential fatty acids. Thus, weight-reducing diets should eliminate alcohol, sugar-containing beverages, and most highly concentrated sweets because they rarely contain adequate amounts of other nutrients besides energy. Breakdown of some protein is to be expected during weight loss. When weight increases as a result of overeating, approximately 75 percent of the extra energy is stored as fat and the remaining 25 percent as lean tissue. If the lean tissue contains 20 percent protein, then 5 percent of the extra weight gain would be protein. Thus, it should be anticipated that during weight  loss, at least 5 percent of weight loss will be protein. A desirable feature of any calorie restricted diet, however, is that it results in the lowest possible loss of protein, recognizing that this will not be less than 5 percent of the weight that is lost. Portion-controlled diets - One simple approach to providing a calorie-controlled diet is to use individually packaged foods, such as formula diet drinks using powdered or liquid formula diets, nutrition bars, frozen food, and pre-packaged meals that can be stored at room temperature as the main source of nutrients. Frozen low-calorie meals containing 250 to 350 kcal/package can be a convenient and nutritious way to do this. We have often recommended the use of formula diets or breakfast bars for breakfast, formula diets or a frozen lunch entree for lunch, and a frozen calorie-controlled entree with additional vegetables for dinner. In this way, it is possible to obtain a calorie-controlled 1000 to 1500 kcal per day  diet. In one four-year study this approach resulted in early initial weight loss, which then was maintained [16]. I do not recommend the use of formula diets alone because they do not provide adequate nutritional variety. Low-fat diets - Low-fat diets are another standard strategy to help patients lose weight, and almost all dietary guidelines recommend a reduction in the daily intake of fat to 30 percent of energy intake or less [17,18]. In a meta-analysis of trials comparing low-fat diets (typically 20 to 25 percent of energy from fats) with a control group consuming a usual diet or a medium fat diet (usually 35 to 40 percent of energy), there was greater weight loss (approximately 3 kg) with low-fat compared with moderate fat diets [19]. In addition, one report noted that people who successfully keep their weight reduced adopt three strategies, one of which is eating a lower fat diet [20]. (See "Dietary fat" and "Etiology and natural history of obesity", section on 'Dietary habits'.) A low-fat dietary pattern with healthy carbohydrates is not associated with weight gain. This was illustrated by the [facility] Dietary Modification Trial of [phone] postmenopausal women over age 27 years who were randomly assigned to a dietary intervention that included group and individual sessions to promote a decrease in fat intake and increases in fruit, vegetable, and grain consumption (healthy carbohydrates), but did not include weight loss or caloric restriction goals, or a control group which received only dietary educational materials [21]. After an average of 7.5 years of follow-up, the following results were seen: Women in the intervention group lost weight in the first year (mean of 2.2 kg) and maintained lower weight than the control women at 7.5 years (difference of 1.9 kg at one year, and 0.4 kg at 7.5 years).  No tendency toward weight gain was seen in the intervention group overall, or when  stratified by age, ethnicity, or body mass index.  Weight loss was related to the level of fat intake and was greatest in women who decreased their percentage of energy from fat the most. A similar, but lesser trend was seen with increased vegetable and fruit intake. A low-fat diet can be implemented in two ways. First, the dietitian can provide the subject with specific menu plans that emphasize the use of reduced fat foods. As one guideline, if a food "melts" in your mouth, it probably has fat in it. Second, subjects can be instructed in counting fat grams as an alternative to counting calories. Fat has 9.4 kcal/g. It is thus very easy to calculate the number  of grams of fat a subject can eat for any given level of energy intake. Many experts recommend keeping calories from fat to below 30 percent of total calories. In practical terms, this means eating about 33 g of fat for each 1000 calories in the diet. For simplicity, I use 30 g of fat or less for each 1000 kcal. For a 1500-calorie diet, this would mean about 45 g or less of fat, which can be counted using the nutrition information labels on food packages. Low-carbohydrate diets - Proponents of low-carbohydrate diets have argued that the increasing obesity epidemic may be in part due to low-fat, high-carbohydrate diets. But this may be dependent upon the type of carbohydrates that are eaten, such as energy dense snacks and sugar or high fructose containing beverages. The carbohydrate content of the diet is an important determinant of short-term (less than two weeks) weight loss. Low (60 to 130 grams of carbohydrates) and very low-carbohydrate diets (0 to <60 grams) have been popular for many years [15]. Restriction of carbohydrates leads to glycogen mobilization and, if carbohydrate intake is less than 50 g/day, ketosis will develop. Rapid weight loss occurs, primarily due to glycogen breakdown and fluid loss rather than fat loss. Low and very  low-carbohydrate diets are more effective for short-term weight loss than low-fat diets, although probably not for long-term weight loss. A meta-analysis of five trials found that the difference in weight loss at six months, favoring the low carbohydrate over low fat diet, was not sustained at 12 months [22]. (See 'Comparison trials' below.) Low-carbohydrate diets may have some other beneficial effects with regard to risk of developing type 2 diabetes mellitus, coronary heart disease, and some cancers, particularly if attention is paid to the type as well as the quantity of carbohydrate. A low-carbohydrate diet can be implemented in two ways, either by reducing the total amount of carbohydrate or by consuming foods with a lower glycemic index or glycemic load (table 3). Glycemic index and load are reviewed separately. (See "Dietary carbohydrates", section on 'Glycemic index'.) If a low-carbohydrate diet is chosen, healthy choices for fat (mono- and polyunsaturated fats) and protein (fish, nuts, legumes, and poultry) should be encouraged because of the association between saturated fat intake and risk of coronary heart disease. During 26 years of follow-up of women in the Nurses' Health Study and 20 years of follow-up of men in the Health Professionals' Follow-up Study, low carbohydrate diets in the highest versus lowest decile for vegetable proteins and fat were associated with lower all-cause mortality (HR 0.80, 95% CI 0.75-0.85) and cardiovascular mortality (HR 0.77, 95% CI 0.68-0.87) [23]. In contrast, low carbohydrate diets in the highest versus lowest decile for animal protein and fat were associated with higher all-cause (HR 1.23, 95% CI 1.11-1.37) and cardiovascular (HR 1.14, 95% CI 1.01-1.29) mortality. (See "Dietary fat" and "Overview of primary prevention of coronary heart disease and stroke", section on 'Healthy diet'.) High protein diets - Some popular books recommend high protein diets [24]. In one  trial, low-fat diets with 12 percent and 25 percent protein content were compared. Weight loss over six months was greater with the higher protein diet (9 versus 5 kg), but the difference was no longer significant at 12 and 24 months [25]. Higher protein diets may improve weight maintenance, as illustrated by the results of a study of 60 subjects randomly assigned to a low fat, high protein versus low-fat, high-carbohydrate diet after completing a four week very low calorie diet [26]. Among the subjects  who completed the three-month study (n = 48), the high protein diet group had significantly better weight maintenance (between group difference of 2.3 kg). High dietary protein intake, due to its acid-producing load, increases urinary calcium excretion (with potential risk for bone loss and calcium stone formation) [27]. Urinary calcium excretion does appear to increase when dietary intake of protein increases [27-29], and this could pose a long-term risk for nephrolithiasis. (See "Risk factors for calcium stones in adults", section on 'Dietary risk factors'.) However, two small randomized trials that looked at bone metabolism found evidence that increased dietary protein may decrease bone resorption [28,29]. One of the trials found that increased intestinal absorption of calcium was primarily responsible for the increased urinary excretion of calcium and that the excreted calcium was not coming from bone [29]. Mediterranean diet - The term Mediterranean diet refers to a dietary pattern that is common in [place] areas of the [place] area. Although there is some variation in Mediterranean diets, there are some common components that include a high level of monounsaturated fat relative to saturated; moderate consumption of alcohol, mainly as wine; a high consumption of vegetables, fruits, legumes, and grains; a moderate consumption of milk and dairy products, mostly in the form of cheese; and a  relatively low intake of meat and meat products. A meta-analysis of 12 studies involving eight cohorts found that a Mediterranean diet was associated with improved health status and reductions in overall mortality, cardiovascular mortality, cancer mortality, and incidence of Parkinson's disease and Alzheimer's disease [30]. (See "Healthy diet in adults", section on 'Mediterranean diet'.) Very low-calorie diets - Diets with energy levels between 200 and 800 kcal/day are called "very low-calorie diets," while those below 200 kcal/day can be termed starvation diets. The basis for these diets was the notion that the lower the calorie intake the more rapid the weight loss, because the energy withdrawn from body fat stores is a function of the energy deficit. Starvation is the ultimate very low-calorie diet and results in the most rapid weight loss. Although once popular, starvation diets are now rarely used for treatment of obesity. Very low-calorie diets have not been shown to be superior to conventional diets for long-term weight loss. In a meta-analysis of six trials comparing very low-calorie diets with conventional low-calorie diets, short-term weight loss was greater with very low-calorie diets (16.1 versus 9.7 versus percent of initial weight), but there was no difference in long-term weight loss (6.3 versus 5.0 percent) [31]. As with all diets, very low-calorie diets initially result in substantial protein loss that diminishes with time. Other expected effects include reduction in blood pressure and improvement in hyperglycemia in diabetic patients. Subjects adhering to very low-calorie diets usually have a fall in blood pressure, especially during the first week. Antihypertensive drugs, especially calcium channel blockers and diuretics, should usually be discontinued when a very low calorie diet is begun unless moderate to severe hypertension is present.  Most diabetic patients eating very low-calorie  diets have marked improvement in hyperglycemia. Blood glucose concentrations fall within the first one to two weeks, and remain lower as long as the diet is continued. Those patients taking less than 50 units of insulin or an oral hypoglycemic drug will usually be able to discontinue therapy [32]. The side effects of very low-calorie diets include hair loss, thinning of the skin, and coldness. These diets are contraindicated for lactating and pregnant women, and in children who require protein for linear growth. As with all diets, there is increased cholesterol mobilization  from peripheral fat stores, thus increasing the risk of gallstones. Very low-calorie diets should be reserved for subjects who require rapid weight loss for a specific purpose, such as surgery. The weight regain when the diet is stopped is often rapid, and it is better to take a more sustainable approach than to use a method that cannot be sustained. Comparison trials - The impact of specific dietary composition on weight change remains uncertain. When energy from dietary carbohydrates decreases, energy from fat sources tends to increase. The reverse is also true; when energy from dietary fats decreases, energy from carbohydrate sources tends to increase. The debate has mainly centered on whether low-fat or low-carbohydrate diets can better induce weight loss and sustain it over the long-term. Weight loss diets - Initial trials evaluating the effect of type of diet (predominantly low-carbohydrate versus low-fat) on weight loss and other outcomes showed that weight loss at six months was approximately 4 kg greater in the very low-carbohydrate group than in the low-fat group [33-35]. Trials lasting for one year, however, did not find a significant difference in weight loss [34,36,37]. A meta-analysis of five trials (including one study not referenced above) found that the difference in weight loss at six months, favoring the low carbohydrate  over low fat diet, was not sustained at 12 months [22]. In one study, this convergence was mainly due to regain of weight in the low-carbohydrate group [34]; in another, the convergence was due to ongoing weight loss in the low-fat group (figure 3) [36]. Some of these initial comparison trials of different dietary regimens had important limitations [22]. These included high dropout rates (21 to 48 percent), suboptimal dietary compliance, and limited long-term follow-up. Subsequent trials are larger, of longer duration (lasting one to two years), and have conflicting results with regard to the impact of macronutrient composition on weight loss [38-41]. In contrast, all trials found that dietary adherence is an important determinant of weight loss, independent of macronutrient composition. The following observations illustrate the range of findings in these trials: In one trial, 322 moderately obese subjects (86 percent men) were randomly assigned to a low-fat (restricted calorie), Mediterranean (moderate-fat, restricted calorie, rich in vegetables, low in red meat), or low-carbohydrate (non-restricted-calorie) diet for two years [38]. Adherence rates were higher than those reported in previous trials (95.4 and 84.6 percent at one and two years, respectively). Weight loss was greater with the Mediterranean and low-carbohydrate diets than the low-fat diet (mean weight loss 4.4, 4.7, and 2.9 kg, respectively).  The most favorable effect on lipids (increased HDL and decreased triglycerides and ratio of total cholesterol to HDL) was seen in the low-carbohydrate group. Among subjects with type 2 diabetes, the greatest improvement in glycemic control occurred with the Mediterranean diet. Among all groups, weight loss was greater for those who completed the two year study than for those who withdrew.  Another randomized trial compared four different diets in 311 overweight and obese premenopausal women: very  low-carbohydrate (Atkins); macronutrient balance controlling glycemic load (Zone); general calorie restriction, low-fat (LEARN); and very low-fat (Ornish) [39]. In the intention-to-treat analysis at one year, mean weight loss was greater in the Atkins diet group compared with the other groups (4.7, 1.6, 2.2, and 2.6 kg, respectively). Pairwise comparisons showed a significant difference only for Atkins versus Zone.  The most favorable effect on triglycerides and HDL-C was seen in the Atkins group. Dietary adherence rates (77 to 88 percent) were similar among the groups and better than in previous trials. Within each  group, adherence was significantly associated with weight loss [42].  In the largest trial to date, 811 overweight and obese adults were randomly assigned to one of four diets based upon macronutrient content: low or high fat (20 to 40 percent), which provided carbohydrate at 35, 45, 55, or 65 percent, and high or average protein (15 to 25 percent) [40]. After six months, mean weight loss in each group was 6 kg. By two years, mean weight loss was 3 to 4 kg, and weight losses remained similar in all groups. Many participants had trouble attaining target levels of macronutrients. Subjects who attended the greatest number of group sessions (most adherent) lost the most weight. Thus, any diet that is adhered to will produce modest weight loss, but adherence rates are low with most diets. Although a low-carbohydrate diet may be associated with greater short-term weight loss, superior weight loss in the long-term has not been established. The optimal mix of macronutrients likely depends upon individual factors [43]. A principal determinant of weight loss appears to be the degree of adherence to the diet, irrespective of the particular macronutrient composition [37,39,40,42,44,45]. Thus, we suggest choosing a macronutrient mix based upon patient preferences, which may improve long-term  adherence. Behavioral modification to improve dietary compliance with any type of diet may have the greatest impact on long-term weight loss. (See "Behavioral strategies in the treatment of obesity".) Lipids - The observed effects on blood lipids were similar for trials comparing low fat and very low carbohydrate diets [22,33-36,46]; the low-carbohydrate/high-fat diets caused slight increases in HDL, and greater decreases in fasting triglycerides. At 12 to 24 months, however, the favorable effects on HDL persisted [34,36,37,41], while triglyceride levels were either reduced [34,36] or returned to baseline [37]. In a meta-analysis of trials comparing low-carbohydrate and low-fat diet groups, LDL levels were increased in the low-carbohydrate group [22]. There was no clear benefit of either low-fat or low-carbohydrate diet on cardiovascular risks. Favorable changes in HDL cholesterol and triglycerides should be weighed against potential unfavorable changes in LDL cholesterol.  Side effects - Very low-carbohydrate diets may be associated with more frequent side effects than low-fat diets. In one of the trials noted above, a number of symptoms occurred significantly more frequently in the low-carbohydrate compared to the low-fat diet group [33]. These included constipation (68 versus 35 percent), headache (60 versus 40 percent), halitosis (38 versus 8 percent), muscle cramps (35 versus 7 percent), diarrhea (23 versus 7 percent), general weakness (25 versus 8 percent), and rash (13 versus 0 percent) [33]. Despite the higher rate of symptoms, dropout rates in clinical trials have been similar for low-carbohydrate and low-fat diets [34-36]. Some have raised the concern about ketosis that occurs with very low-carbohydrate diets. There is one case report of an obese patient who presented in severe ketoacidosis, having lost 9 kg in one month on the Atkins diet, with intake restricted to meat, cheese, and salads [47].  Aside from her diet and possible mild dehydration due to gastroenteritis, no other cause for her ketoacidosis was identified. Weight maintenance diets - Although many individuals have success losing weight with diet, most subsequently regain much or all of the lost weight. Maintaining weight loss is made difficult by the reduction in energy expenditure that is induced by weight loss. In addition, long-term adherence to restrictive diets is difficult. Exercise and behavioral interventions may help individuals maintain weight loss. These strategies are reviewed in detail elsewhere. (See "Role of physical activity and exercise in obesity", section on 'Maintenance of weight  loss' and "Behavioral strategies in the treatment of obesity", section on 'Maintenance of weight loss'.) There is little consensus on the optimal mix of macronutrients to maintain weight loss. The satiating effects of high protein, low glycemic index diets have generated interest in manipulating protein composition and glycemic index in weight maintenance diets. (See 'High protein diets' above and "Dietary carbohydrates", section on 'Effect of glycemic index/glycemic load'.) In a multicenter trial of five ad libitum diets to prevent weight regain over 26 weeks, 773 adults who had successfully lost 8 percent of their body weight on a low calorie diet (800 to 1000 kcal/day), were randomly assigned in a two-by-two factorial design to a high or low-protein (25 versus 13 percent of total calories), high or low-glycemic index, or to a control diet (moderate protein content) [48]. All diets had a moderate fat content (25 to 30 percent). The achieved protein content was 5 percentage points higher in the high versus low protein groups, and the mean glycemic index was five units lower in the low-glycemic versus high-glycemic index groups. In the intention-to-treat analysis, weight regain during the trial was modestly but significantly greater in the low  versus high-protein groups (mean difference 0.93 kg) and in the high versus low-glycemic index groups (mean difference 0.95 kg). Only subjects in the high-protein, low-glycemic index diet group continued to lose weight (mean change -0.38 kg). The trial was limited by the moderate dropout rate (29 percent) and short-term follow-up (six months). Whether a low glycemic index, high protein diet is associated with long-term weight maintenance is unknown. As discussed above, long-term adherence to a weight maintaining diet is probably the most important determinant of success, and therefore the optimal weight maintaining diet will depend upon preference and individual factors. Role of dietary counseling - Dietary counseling may produce modest, short-term weight losses. This topic is reviewed in detail elsewhere. (See "Behavioral strategies in the treatment of obesity", section on 'Elements of behavioral strategies' and "Behavioral strategies in the treatment of obesity", section on 'Efficacy'.)  Prolonged caloric restriction and longevity - Prolonged caloric restriction improves longevity in rodents and non-human primates [49], but it is not known if the same is true in humans. It is hypothesized that the antiaging effects of caloric restriction are due to reduced energy expenditure resulting in a reduction in production of reactive oxygen species (and therefore a reduction in oxidative damage). In addition, other metabolic effects associated with caloric restriction, such as improved insulin sensitivity, might also have an antiaging effect. In one trial of 48 sedentary, overweight men and women, six months of caloric restriction, with or without exercise, resulted in significant weight loss as expected [50]. In addition, calorie restriction-mediated reductions in fasting insulin concentrations, core body temperature, serum T3 levels, and oxidative damage to DNA (as reflected by a reduction in DNA fragmentation) were  seen, suggesting a possible antiaging effect of the prolonged caloric restriction. INFORMATION FOR PATIENTS - UpToDate offers two types of patient education materials, "The Basics" and "Beyond the Basics." The Basics patient education pieces are written in plain language, at the 5th to 6th grade reading level, and they answer the four or five key questions a patient might have about a given condition. These articles are best for patients who want a general overview and who prefer short, easy-to-read materials. Beyond the Basics patient education pieces are longer, more sophisticated, and more detailed. These articles are written at the 10th to 12th grade reading level and are best for patients who want in-depth information and  are comfortable with some medical jargon. Here are the patient education articles that are relevant to this topic. We encourage you to print or e-mail these topics to your patients. (You can also locate patient education articles on a variety of subjects by searching on "patient info" and the keyword(s) of interest.)  Basics topics (see "Patient information: Diet and health (The Basics)" and "Patient information: Weight loss treatments (The Basics)")  Beyond the Basics topics (see "Patient information: Diet and health (Beyond the Basics)" and "Patient information: Weight loss treatments (Beyond the Basics)" and "Patient information: Weight loss surgery (Beyond the Basics)")  SUMMARY AND RECOMMENDATIONS An initial weight loss goal of 5 to 7 percent of body weight is realistic for most individuals. (See 'Goals of weight loss' above.)  Many types of diets produce modest weight loss. Options include balanced low-calorie, low-fat low-calorie, moderate-fat low calorie, low-carbohydrate diets, and the Mediterranean diet. Dietary adherence is an important predictor of weight loss, irrespective of the type of diet. (See 'Types of diets' above.)  We suggest tailoring a diet that reduces energy  intake below energy expenditure to individual patient preferences, rather than focusing on the macronutrient composition of the diet (Grade 2B). (See 'Comparison trials' above.)  If a low-carbohydrate diet is chosen, healthy choices for fat (mono and polyunsaturated) and protein (fish, nuts, legumes, and poultry) should be encouraged. If a low-fat diet is chosen, the decrease in fat should be accompanied by increases in healthy carbohydrates (fruits, vegetables, whole grains).  Dietary therapy for obesity  All topics are updated as new evidence becomes available and our peer review process is complete.  Literature review current through: Jun 2012.  This topic last updated: Oct 04, 2010.  INTRODUCTION - The optimal management of overweight and obesity requires a combination of diet, exercise, and behavioral modification. In addition, some patients eventually require pharmacologic therapy or bariatric surgery. The risk of overweight to the subject should be evaluated before beginning any treatment program. Selection of treatment can then be made using a risk-benefit assessment). The choice of therapy is dependent on several factors including the degree of overweight or obesity and patient preference.  This topic will review the dietary therapy of obesity. Other aspects of treatment are discussed separately. (See "Health hazards associated with obesity in adults" and "Overview of therapy for obesity in adults" and "Drug therapy of obesity" and "Behavioral strategies in the treatment of obesity".) GOALS OF WEIGHT LOSS - It is important to set goals when discussing a dietary weight loss program with an individual patient. An initial weight loss goal of 5 to 7 percent of body weight is realistic for most individuals. The first goal for any overweight individual is to prevent further weight gain and keep body weight stable (within 5 pounds of its current level).  The goal of the clinician is to identify and  review with the patient a realistic weight-loss goal. Most patients have a weight loss goal of 30 percent or more below current weight, which is unrealistic [1].  A successful program will lead to a weight loss of more than 5 percent of initial weight [2]. A weight loss of more than 5 percent can reduce risk factors for cardiovascular disease, such as dyslipidemia, hypertension, and diabetes mellitus [3]. In the Diabetes Prevention Program, a multi-center trial in patients with impaired glucose tolerance, weight loss of 7 percent reduced the rate of progression from impaired glucose tolerance to diabetes by 58 percent [4]. (See "Prediction and prevention of type 2 diabetes  mellitus", section on 'Diabetes Prevention Program'.)  Loss of 5 percent of initial body weight and maintenance of this loss is a good medical result, even if the subject does not reach his or her "dream" weight.  Although an extremely difficult goal to achieve, a body mass index (BMI) between 20 and 25 kg/m2 puts the subject in the lowest risk category (table 1 and figure 1). DIETARY ENERGY Rate of weight loss - The rate of weight loss is directly related to the difference between the subject's energy intake and energy requirements. Reducing caloric intake below expenditure results in a predictable initial rate of weight loss that is related to the energy deficit [5,6]. However, prediction of weight loss for an individual subject can be difficult because of marked intersubject variability in initial body composition, adherence, and energy expenditure [5,7]. Food records are often inaccurate. Most normal-weight people under-report what they eat by 10 to 30 percent, while overweight people under-report by 30 percent or more [8]. In addition, energy requirements are influenced by fidgeting, gender, age, and genetic factors [5,6,9]. As examples: Men lose more weight than women of similar height and weight when they comply with eating any given  diet because men have more lean body mass, less percent body fat, and therefore higher energy expenditure.  Older subjects of either sex have a lower energy expenditure and therefore lose weight more slowly than younger subjects; metabolic rate declines by approximately 2 percent per decade (about 100 kcal/decade) [10].  The importance of genetic factors is illustrated by a study of identical female twin pairs who were overfed to induce weight gain [11]. Twelve twin pairs were overfed by 1000 kcal/day for 84 of 100 days. The degree of weight gain at a constant dietary caloric increment varied widely among the twin pairs (from 4.3 to 13.3 kg), in fact, there was three times the variance for both weight and fat mass among the twin pairs when compared with that within the twin pairs. Approximately 22 kcal/kg is required to maintain a kilogram of body weight in a normal adult. Thus, the expected or calculated energy expenditure for a woman weighing 100 kg is approximately 2200 kcal/day. The variability of 20 percent could give energy needs as high as 2620 kcal/day or as low as 1860 kcal/day. An average deficit of 500 kcal/day should result in an initial weight loss of approximately 0.5 kg/week (1 lb/week). However, after three to six months of weight loss, energy expenditure adaptations occur, which slow the bodyweight response to a given change in energy intake, thereby diminishing ongoing weight loss [7]. There are several methods of formally estimating energy expenditure; we suggest using the WHO criteria (table 2). This method allows a direct estimate of resting metabolic rate (RMR) and calculation of daily energy requirement. The low activity level (1.3 x RMR) includes subjects who lead a sedentary life. The high activity level (1.7 x RMR) applies to those in jobs requiring manual labor or patients with regular daily physical exercise programs [12]. Maintenance of weight loss - It is important for the  overweight subject to understand that achieving and maintaining weight loss is made difficult by the reduction in energy expenditure that is induced by weight loss (figure 2) [13]. Weight loss maintenance is also difficult because of changes in the peripheral hormone signals that regulate appetite. Gastrointestinal peptides, such as ghrelin, which stimulates appetite, and gastric inhibitory polypeptide, which may promote energy storage, increase after diet-induced weight loss. Other circulating mediators that inhibit intake (eg, leptin,  peptide YY, cholecystokinin, pancreatic polypeptide) decrease. These hormonal adaptations favoring weight gain persist for at least one year after diet-induced weight loss [14]. (See "Overview of therapy for obesity in adults", section on 'Maintenance of weight loss' and "Pathogenesis of obesity", section on 'Ghrelin'.)  TYPES OF DIETS - The general consensus is that excess intake of calories from any source, associated with a sedentary lifestyle, causes weight gain and obesity. The goal of dietary therapy, therefore, is to decrease energy intake from food. Conventional diets are defined as those below energy requirements but above 800 kcal/day [15]. These diets fall into four groups: Balanced low-calorie diets/portion-controlled diets  Low-fat diets  Low-carbohydrate diets  Mediterranean diet  Fad diets (diets involving unusual combinations of foods or eating sequences) Commercial weight loss programs and internet-based programs are discussed elsewhere. (See "Behavioral strategies in the treatment of obesity".) Balanced low-calorie diets - Planning a diet requires the selection of a caloric intake and then selection of foods to meet this intake. It is desirable to eat foods with adequate nutrients in addition to protein, carbohydrate, and essential fatty acids. Thus, weight-reducing diets should eliminate alcohol, sugar-containing beverages, and most highly concentrated  sweets because they rarely contain adequate amounts of other nutrients besides energy. Breakdown of some protein is to be expected during weight loss. When weight increases as a result of overeating, approximately 75 percent of the extra energy is stored as fat and the remaining 25 percent as lean tissue. If the lean tissue contains 20 percent protein, then 5 percent of the extra weight gain would be protein. Thus, it should be anticipated that during weight loss, at least 5 percent of weight loss will be protein. A desirable feature of any calorie restricted diet, however, is that it results in the lowest possible loss of protein, recognizing that this will not be less than 5 percent of the weight that is lost. Portion-controlled diets - One simple approach to providing a calorie-controlled diet is to use individually packaged foods, such as formula diet drinks using powdered or liquid formula diets, nutrition bars, frozen food, and pre-packaged meals that can be stored at room temperature as the main source of nutrients. Frozen low-calorie meals containing 250 to 350 kcal/package can be a convenient and nutritious way to do this. We have often recommended the use of formula diets or breakfast bars for breakfast, formula diets or a frozen lunch entree for lunch, and a frozen calorie-controlled entree with additional vegetables for dinner. In this way, it is possible to obtain a calorie-controlled 1000 to 1500 kcal per day diet. In one four-year study this approach resulted in early initial weight loss, which then was maintained [16]. I do not recommend the use of formula diets alone because they do not provide adequate nutritional variety. Low-fat diets - Low-fat diets are another standard strategy to help patients lose weight, and almost all dietary guidelines recommend a reduction in the daily intake of fat to 30 percent of energy intake or less [17,18]. In a meta-analysis of trials comparing low-fat diets  (typically 20 to 25 percent of energy from fats) with a control group consuming a usual diet or a medium fat diet (usually 35 to 40 percent of energy), there was greater weight loss (approximately 3 kg) with low-fat compared with moderate fat diets [19]. In addition, one report noted that people who successfully keep their weight reduced adopt three strategies, one of which is eating a lower fat diet [20]. (See "Dietary fat" and "Etiology and natural  history of obesity", section on 'Dietary habits'.) A low-fat dietary pattern with healthy carbohydrates is not associated with weight gain. This was illustrated by the [facility] Dietary Modification Trial of [phone] postmenopausal women over age 38 years who were randomly assigned to a dietary intervention that included group and individual sessions to promote a decrease in fat intake and increases in fruit, vegetable, and grain consumption (healthy carbohydrates), but did not include weight loss or caloric restriction goals, or a control group which received only dietary educational materials [21]. After an average of 7.5 years of follow-up, the following results were seen: Women in the intervention group lost weight in the first year (mean of 2.2 kg) and maintained lower weight than the control women at 7.5 years (difference of 1.9 kg at one year, and 0.4 kg at 7.5 years).  No tendency toward weight gain was seen in the intervention group overall, or when stratified by age, ethnicity, or body mass index.  Weight loss was related to the level of fat intake and was greatest in women who decreased their percentage of energy from fat the most. A similar, but lesser trend was seen with increased vegetable and fruit intake. A low-fat diet can be implemented in two ways. First, the dietitian can provide the subject with specific menu plans that emphasize the use of reduced fat foods. As one guideline, if a food "melts" in your mouth, it probably has fat  in it. Second, subjects can be instructed in counting fat grams as an alternative to counting calories. Fat has 9.4 kcal/g. It is thus very easy to calculate the number of grams of fat a subject can eat for any given level of energy intake. Many experts recommend keeping calories from fat to below 30 percent of total calories. In practical terms, this means eating about 33 g of fat for each 1000 calories in the diet. For simplicity, I use 30 g of fat or less for each 1000 kcal. For a 1500-calorie diet, this would mean about 45 g or less of fat, which can be counted using the nutrition information labels on food packages. Low-carbohydrate diets - Proponents of low-carbohydrate diets have argued that the increasing obesity epidemic may be in part due to low-fat, high-carbohydrate diets. But this may be dependent upon the type of carbohydrates that are eaten, such as energy dense snacks and sugar or high fructose containing beverages. The carbohydrate content of the diet is an important determinant of short-term (less than two weeks) weight loss. Low (60 to 130 grams of carbohydrates) and very low-carbohydrate diets (0 to <60 grams) have been popular for many years [15]. Restriction of carbohydrates leads to glycogen mobilization and, if carbohydrate intake is less than 50 g/day, ketosis will develop. Rapid weight loss occurs, primarily due to glycogen breakdown and fluid loss rather than fat loss. Low and very low-carbohydrate diets are more effective for short-term weight loss than low-fat diets, although probably not for long-term weight loss. A meta-analysis of five trials found that the difference in weight loss at six months, favoring the low carbohydrate over low fat diet, was not sustained at 12 months [22]. (See 'Comparison trials' below.) Low-carbohydrate diets may have some other beneficial effects with regard to risk of developing type 2 diabetes mellitus, coronary heart disease, and some cancers,  particularly if attention is paid to the type as well as the quantity of carbohydrate. A low-carbohydrate diet can be implemented in two ways, either by reducing the total amount of  carbohydrate or by consuming foods with a lower glycemic index or glycemic load (table 3). Glycemic index and load are reviewed separately. (See "Dietary carbohydrates", section on 'Glycemic index'.) If a low-carbohydrate diet is chosen, healthy choices for fat (mono- and polyunsaturated fats) and protein (fish, nuts, legumes, and poultry) should be encouraged because of the association between saturated fat intake and risk of coronary heart disease. During 26 years of follow-up of women in the Nurses' Health Study and 20 years of follow-up of men in the Health Professionals' Follow-up Study, low carbohydrate diets in the highest versus lowest decile for vegetable proteins and fat were associated with lower all-cause mortality (HR 0.80, 95% CI 0.75-0.85) and cardiovascular mortality (HR 0.77, 95% CI 0.68-0.87) [23]. In contrast, low carbohydrate diets in the highest versus lowest decile for animal protein and fat were associated with higher all-cause (HR 1.23, 95% CI 1.11-1.37) and cardiovascular (HR 1.14, 95% CI 1.01-1.29) mortality. (See "Dietary fat" and "Overview of primary prevention of coronary heart disease and stroke", section on 'Healthy diet'.) High protein diets - Some popular books recommend high protein diets [24]. In one trial, low-fat diets with 12 percent and 25 percent protein content were compared. Weight loss over six months was greater with the higher protein diet (9 versus 5 kg), but the difference was no longer significant at 12 and 24 months [25]. Higher protein diets may improve weight maintenance, as illustrated by the results of a study of 60 subjects randomly assigned to a low fat, high protein versus low-fat, high-carbohydrate diet after completing a four week very low calorie diet [26]. Among the subjects  who completed the three-month study (n = 48), the high protein diet group had significantly better weight maintenance (between group difference of 2.3 kg). High dietary protein intake, due to its acid-producing load, increases urinary calcium excretion (with potential risk for bone loss and calcium stone formation) [27]. Urinary calcium excretion does appear to increase when dietary intake of protein increases [27-29], and this could pose a long-term risk for nephrolithiasis. (See "Risk factors for calcium stones in adults", section on 'Dietary risk factors'.) However, two small randomized trials that looked at bone metabolism found evidence that increased dietary protein may decrease bone resorption [28,29]. One of the trials found that increased intestinal absorption of calcium was primarily responsible for the increased urinary excretion of calcium and that the excreted calcium was not coming from bone [29]. Mediterranean diet - The term Mediterranean diet refers to a dietary pattern that is common in [place] areas of the [place] area. Although there is some variation in Mediterranean diets, there are some common components that include a high level of monounsaturated fat relative to saturated; moderate consumption of alcohol, mainly as wine; a high consumption of vegetables, fruits, legumes, and grains; a moderate consumption of milk and dairy products, mostly in the form of cheese; and a relatively low intake of meat and meat products. A meta-analysis of 12 studies involving eight cohorts found that a Mediterranean diet was associated with improved health status and reductions in overall mortality, cardiovascular mortality, cancer mortality, and incidence of Parkinson's disease and Alzheimer's disease [30]. (See "Healthy diet in adults", section on 'Mediterranean diet'.) Very low-calorie diets - Diets with energy levels between 200 and 800 kcal/day are called "very low-calorie diets," while  those below 200 kcal/day can be termed starvation diets. The basis for these diets was the notion that the lower the calorie intake the more rapid the weight loss, because the energy withdrawn  from body fat stores is a function of the energy deficit. Starvation is the ultimate very low-calorie diet and results in the most rapid weight loss. Although once popular, starvation diets are now rarely used for treatment of obesity. Very low-calorie diets have not been shown to be superior to conventional diets for long-term weight loss. In a meta-analysis of six trials comparing very low-calorie diets with conventional low-calorie diets, short-term weight loss was greater with very low-calorie diets (16.1 versus 9.7 versus percent of initial weight), but there was no difference in long-term weight loss (6.3 versus 5.0 percent) [31]. As with all diets, very low-calorie diets initially result in substantial protein loss that diminishes with time. Other expected effects include reduction in blood pressure and improvement in hyperglycemia in diabetic patients. Subjects adhering to very low-calorie diets usually have a fall in blood pressure, especially during the first week. Antihypertensive drugs, especially calcium channel blockers and diuretics, should usually be discontinued when a very low calorie diet is begun unless moderate to severe hypertension is present.  Most diabetic patients eating very low-calorie diets have marked improvement in hyperglycemia. Blood glucose concentrations fall within the first one to two weeks, and remain lower as long as the diet is continued. Those patients taking less than 50 units of insulin or an oral hypoglycemic drug will usually be able to discontinue therapy [32]. The side effects of very low-calorie diets include hair loss, thinning of the skin, and coldness. These diets are contraindicated for lactating and pregnant women, and in children who require protein for linear growth.  As with all diets, there is increased cholesterol mobilization from peripheral fat stores, thus increasing the risk of gallstones. Very low-calorie diets should be reserved for subjects who require rapid weight loss for a specific purpose, such as surgery. The weight regain when the diet is stopped is often rapid, and it is better to take a more sustainable approach than to use a method that cannot be sustained. Comparison trials - The impact of specific dietary composition on weight change remains uncertain. When energy from dietary carbohydrates decreases, energy from fat sources tends to increase. The reverse is also true; when energy from dietary fats decreases, energy from carbohydrate sources tends to increase. The debate has mainly centered on whether low-fat or low-carbohydrate diets can better induce weight loss and sustain it over the long-term. Weight loss diets - Initial trials evaluating the effect of type of diet (predominantly low-carbohydrate versus low-fat) on weight loss and other outcomes showed that weight loss at six months was approximately 4 kg greater in the very low-carbohydrate group than in the low-fat group [33-35]. Trials lasting for one year, however, did not find a significant difference in weight loss [34,36,37]. A meta-analysis of five trials (including one study not referenced above) found that the difference in weight loss at six months, favoring the low carbohydrate over low fat diet, was not sustained at 12 months [22]. In one study, this convergence was mainly due to regain of weight in the low-carbohydrate group [34]; in another, the convergence was due to ongoing weight loss in the low-fat group (figure 3) [36]. Some of these initial comparison trials of different dietary regimens had important limitations [22]. These included high dropout rates (21 to 48 percent), suboptimal dietary compliance, and limited long-term follow-up. Subsequent trials are larger, of longer  duration (lasting one to two years), and have conflicting results with regard to the impact of macronutrient composition on weight loss [38-41]. In contrast, all trials  found that dietary adherence is an important determinant of weight loss, independent of macronutrient composition. The following observations illustrate the range of findings in these trials: In one trial, 322 moderately obese subjects (86 percent men) were randomly assigned to a low-fat (restricted calorie), Mediterranean (moderate-fat, restricted calorie, rich in vegetables, low in red meat), or low-carbohydrate (non-restricted-calorie) diet for two years [38]. Adherence rates were higher than those reported in previous trials (95.4 and 84.6 percent at one and two years, respectively). Weight loss was greater with the Mediterranean and low-carbohydrate diets than the low-fat diet (mean weight loss 4.4, 4.7, and 2.9 kg, respectively).  The most favorable effect on lipids (increased HDL and decreased triglycerides and ratio of total cholesterol to HDL) was seen in the low-carbohydrate group. Among subjects with type 2 diabetes, the greatest improvement in glycemic control occurred with the Mediterranean diet. Among all groups, weight loss was greater for those who completed the two year study than for those who withdrew.  Another randomized trial compared four different diets in 311 overweight and obese premenopausal women: very low-carbohydrate (Atkins); macronutrient balance controlling glycemic load (Zone); general calorie restriction, low-fat (LEARN); and very low-fat (Ornish) [39]. In the intention-to-treat analysis at one year, mean weight loss was greater in the Atkins diet group compared with the other groups (4.7, 1.6, 2.2, and 2.6 kg, respectively). Pairwise comparisons showed a significant difference only for Atkins versus Zone.  The most favorable effect on triglycerides and HDL-C was seen in the Atkins group. Dietary adherence  rates (77 to 88 percent) were similar among the groups and better than in previous trials. Within each group, adherence was significantly associated with weight loss [42].  In the largest trial to date, 811 overweight and obese adults were randomly assigned to one of four diets based upon macronutrient content: low or high fat (20 to 40 percent), which provided carbohydrate at 35, 45, 55, or 65 percent, and high or average protein (15 to 25 percent) [40]. After six months, mean weight loss in each group was 6 kg. By two years, mean weight loss was 3 to 4 kg, and weight losses remained similar in all groups. Many participants had trouble attaining target levels of macronutrients. Subjects who attended the greatest number of group sessions (most adherent) lost the most weight. Thus, any diet that is adhered to will produce modest weight loss, but adherence rates are low with most diets. Although a low-carbohydrate diet may be associated with greater short-term weight loss, superior weight loss in the long-term has not been established. The optimal mix of macronutrients likely depends upon individual factors [43]. A principal determinant of weight loss appears to be the degree of adherence to the diet, irrespective of the particular macronutrient composition [37,39,40,42,44,45]. Thus, we suggest choosing a macronutrient mix based upon patient preferences, which may improve long-term adherence. Behavioral modification to improve dietary compliance with any type of diet may have the greatest impact on long-term weight loss. (See "Behavioral strategies in the treatment of obesity".) Lipids - The observed effects on blood lipids were similar for trials comparing low fat and very low carbohydrate diets [22,33-36,46]; the low-carbohydrate/high-fat diets caused slight increases in HDL, and greater decreases in fasting triglycerides. At 12 to 24 months, however, the favorable effects on HDL persisted [34,36,37,41], while  triglyceride levels were either reduced [34,36] or returned to baseline [37]. In a meta-analysis of trials comparing low-carbohydrate and low-fat diet groups, LDL levels were increased in the low-carbohydrate group [22]. There was no clear benefit  of either low-fat or low-carbohydrate diet on cardiovascular risks. Favorable changes in HDL cholesterol and triglycerides should be weighed against potential unfavorable changes in LDL cholesterol.  Side effects - Very low-carbohydrate diets may be associated with more frequent side effects than low-fat diets. In one of the trials noted above, a number of symptoms occurred significantly more frequently in the low-carbohydrate compared to the low-fat diet group [33]. These included constipation (68 versus 35 percent), headache (60 versus 40 percent), halitosis (38 versus 8 percent), muscle cramps (35 versus 7 percent), diarrhea (23 versus 7 percent), general weakness (25 versus 8 percent), and rash (13 versus 0 percent) [33]. Despite the higher rate of symptoms, dropout rates in clinical trials have been similar for low-carbohydrate and low-fat diets [34-36]. Some have raised the concern about ketosis that occurs with very low-carbohydrate diets. There is one case report of an obese patient who presented in severe ketoacidosis, having lost 9 kg in one month on the Atkins diet, with intake restricted to meat, cheese, and salads [47]. Aside from her diet and possible mild dehydration due to gastroenteritis, no other cause for her ketoacidosis was identified. Weight maintenance diets - Although many individuals have success losing weight with diet, most subsequently regain much or all of the lost weight. Maintaining weight loss is made difficult by the reduction in energy expenditure that is induced by weight loss. In addition, long-term adherence to restrictive diets is difficult. Exercise and behavioral interventions may help individuals maintain weight loss. These  strategies are reviewed in detail elsewhere. (See "Role of physical activity and exercise in obesity", section on 'Maintenance of weight loss' and "Behavioral strategies in the treatment of obesity", section on 'Maintenance of weight loss'.) There is little consensus on the optimal mix of macronutrients to maintain weight loss. The satiating effects of high protein, low glycemic index diets have generated interest in manipulating protein composition and glycemic index in weight maintenance diets. (See 'High protein diets' above and "Dietary carbohydrates", section on 'Effect of glycemic index/glycemic load'.) In a multicenter trial of five ad libitum diets to prevent weight regain over 26 weeks, 773 adults who had successfully lost 8 percent of their body weight on a low calorie diet (800 to 1000 kcal/day), were randomly assigned in a two-by-two factorial design to a high or low-protein (25 versus 13 percent of total calories), high or low-glycemic index, or to a control diet (moderate protein content) [48]. All diets had a moderate fat content (25 to 30 percent). The achieved protein content was 5 percentage points higher in the high versus low protein groups, and the mean glycemic index was five units lower in the low-glycemic versus high-glycemic index groups. In the intention-to-treat analysis, weight regain during the trial was modestly but significantly greater in the low versus high-protein groups (mean difference 0.93 kg) and in the high versus low-glycemic index groups (mean difference 0.95 kg). Only subjects in the high-protein, low-glycemic index diet group continued to lose weight (mean change -0.38 kg). The trial was limited by the moderate dropout rate (29 percent) and short-term follow-up (six months). Whether a low glycemic index, high protein diet is associated with long-term weight maintenance is unknown. As discussed above, long-term adherence to a weight maintaining diet is probably the most  important determinant of success, and therefore the optimal weight maintaining diet will depend upon preference and individual factors. Role of dietary counseling - Dietary counseling may produce modest, short-term weight losses. This topic is reviewed in detail elsewhere. (See "Behavioral strategies  in the treatment of obesity", section on 'Elements of behavioral strategies' and "Behavioral strategies in the treatment of obesity", section on 'Efficacy'.)  Prolonged caloric restriction and longevity - Prolonged caloric restriction improves longevity in rodents and non-human primates [49], but it is not known if the same is true in humans. It is hypothesized that the antiaging effects of caloric restriction are due to reduced energy expenditure resulting in a reduction in production of reactive oxygen species (and therefore a reduction in oxidative damage). In addition, other metabolic effects associated with caloric restriction, such as improved insulin sensitivity, might also have an antiaging effect. In one trial of 48 sedentary, overweight men and women, six months of caloric restriction, with or without exercise, resulted in significant weight loss as expected [50]. In addition, calorie restriction-mediated reductions in fasting insulin concentrations, core body temperature, serum T3 levels, and oxidative damage to DNA (as reflected by a reduction in DNA fragmentation) were seen, suggesting a possible antiaging effect of the prolonged caloric restriction. INFORMATION FOR PATIENTS - UpToDate offers two types of patient education materials, "The Basics" and "Beyond the Basics." The Basics patient education pieces are written in plain language, at the 5th to 6th grade reading level, and they answer the four or five key questions a patient might have about a given condition. These articles are best for patients who want a general overview and who prefer short, easy-to-read materials. Beyond the Basics patient  education pieces are longer, more sophisticated, and more detailed. These articles are written at the 10th to 12th grade reading level and are best for patients who want in-depth information and are comfortable with some medical jargon. Here are the patient education articles that are relevant to this topic. We encourage you to print or e-mail these topics to your patients. (You can also locate patient education articles on a variety of subjects by searching on "patient info" and the keyword(s) of interest.)  Basics topics (see "Patient information: Diet and health (The Basics)" and "Patient information: Weight loss treatments (The Basics)")  Beyond the Basics topics (see "Patient information: Diet and health (Beyond the Basics)" and "Patient information: Weight loss treatments (Beyond the Basics)" and "Patient information: Weight loss surgery (Beyond the Basics)")  SUMMARY AND RECOMMENDATIONS An initial weight loss goal of 5 to 7 percent of body weight is realistic for most individuals. (See 'Goals of weight loss' above.)  Many types of diets produce modest weight loss. Options include balanced low-calorie, low-fat low-calorie, moderate-fat low calorie, low-carbohydrate diets, and the Mediterranean diet. Dietary adherence is an important predictor of weight loss, irrespective of the type of diet. (See 'Types of diets' above.)  We suggest tailoring a diet that reduces energy intake below energy expenditure to individual patient preferences, rather than focusing on the macronutrient composition of the diet (Grade 2B). (See 'Comparison trials' above.)  If a low-carbohydrate diet is chosen, healthy choices for fat (mono and polyunsaturated) and protein (fish, nuts, legumes, and poultry) should be encouraged. If a low-fat diet is chosen, the decrease in fat should be accompanied by increases in healthy carbohydrates (fruits, vegetables, whole grains).

## 2011-06-02 NOTE — Progress Notes (Signed)
Addended by: Landis Martins R on: 06/02/2011 11:10 AM   Modules accepted: Orders

## 2011-06-02 NOTE — Progress Notes (Signed)
Rachel Ochoa 13-Dec-1970 [id number]   History:    40 y.o.  for annual exam with no complaints today. Her mammogram was in the early part of this year she is scheduled for a mammogram until the age of 80. Patient with prior history transvaginal hysterectomy.  Past medical history,surgical history, family history and social history were all reviewed and documented in the EPIC chart. ROS:  Was performed and pertinent positives and negatives are included in the history.  Exam: chaperone present Filed Vitals:   06/02/11 0948  BP: 110/70   Body mass index is 28.28 kg/(m^2).  General appearance : Well developed well nourished female. Skin grossly normal HEENT: Neck supple, trachea midline Lungs: Clear to auscultation, no rhonchi or wheezes Heart: Regular rate and rhythm, no murmurs or gallops Breast:Examined in sitting and supine position were symmetrical in appearance, no palpable masses, to skin retraction, no nipple inversion, no nipple discharge and no axillary or supraclavicular lymphadenopathy Abdomen: no palpable masses or tenderness Pelvic  Ext/BUS/vagina  normal   Cervix  normal   Uterus  absent   Adnexa  Without masses or tenderness  Anus and perineum  normal   Rectovaginal  normal sphincter tone without palpated masses or tenderness.   Assessment/Plan:  40 y.o. female for annual exam no abnormal findings. Patient was instructed to take her calcium and vitamin D twice a day for osteoporosis prevention and to engage in weightbearing exercises. She has lost 5-6 passes last year she's working on diet as well. She is to continue her monthly self breast examination and to follow up with a mammogram at the age of 7. Her CBC urinalysis and Pap was done today along with a screening cholesterol and random blood sugar since she has a strong family history of diabetes. Will otherwise see her back in one year or when necessary    [name], 10:27 AM 06/02/2011

## 2011-06-10 ENCOUNTER — Encounter: Payer: Self-pay | Admitting: Internal Medicine

## 2011-06-10 ENCOUNTER — Ambulatory Visit (INDEPENDENT_AMBULATORY_CARE_PROVIDER_SITE_OTHER)
Admission: RE | Admit: 2011-06-10 | Discharge: 2011-06-10 | Disposition: A | Discharge status: Home / Self Care | Payer: Managed Care, Other (non HMO) | Source: Ambulatory Visit | Attending: Internal Medicine | Admitting: Internal Medicine

## 2011-06-10 ENCOUNTER — Ambulatory Visit (INDEPENDENT_AMBULATORY_CARE_PROVIDER_SITE_OTHER): Payer: Managed Care, Other (non HMO) | Admitting: Internal Medicine

## 2011-06-10 VITALS — BP 118/82 | HR 95 | Temp 98.3°F | Resp 14 | Wt 139.1 lb

## 2011-06-10 DIAGNOSIS — M542 Cervicalgia: Secondary | ICD-10-CM

## 2011-06-10 NOTE — Patient Instructions (Signed)
ER if symptoms severe, difficulty swallowing or neck swelling. Get the x-rays  Please came back in  4 days (overbook OK)

## 2011-06-10 NOTE — Progress Notes (Signed)
  Subjective:    Patient ID: Rachel Ochoa, female    DOB: 1971/05/04, 40 y.o.   MRN: [id number]  HPI Developed anterior neck pain bilaterally 5 days ago, pain was worse with swallowing or deep breaths. The pain extended somehow to the  upper chest. Currently slightly better, the pain is now more localized to the right side of the neck and gets worse when she bends to the right or eats. No neck injury but she has been doing more heavy lifting at the gym  Past Medical History  Diagnosis Date  . Epilepsy     better by age 8  . Elevated BP     without diagnosis of hypertension 2/11  . Dyslipidemia   . Abdominal pain     chronic sees GI IBS  . Leukocytopenia     thromboycytopenia, unspecified- sees hematology, rx oberservation  . IBS (irritable bowel syndrome)   . Migraine aura, persistent     saw neuro 12/10  . NSVD (normal spontaneous vaginal delivery)     X3   Past Surgical History  Procedure Date  . Cholecystectomy   . Dilation and curettage of uterus   . Tonsillectomy   . Breast surgery     BREAST REDUCTION  . Abdominal hysterectomy     TAH Only     Review of Systems Denies any nausea or vomiting. No actual heartburn. No abdominal pain, specifically no right upper quadrant pain. No recent URI symptoms, runny nose, sinus congestion. No cough, mild shortness of breath?. No extremity edema.      Objective:   Physical Exam  Constitutional: She appears well-developed and well-nourished.  HENT:  Head: Normocephalic and atraumatic.       No drooling , throat without redness or discharge, opening and closing of the mouth normal. Voice normal  Neck:       Normal to inspection, slightly tender to palpation on both sides without mass or crepitus. No supraclavicular mass or crepitus either. No thyromegaly  Cardiovascular: Normal rate, regular rhythm and normal heart sounds.   No murmur heard. Pulmonary/Chest: Effort normal and breath sounds normal. No respiratory  distress. She has no wheezes. She has no rales.  Abdominal: Soft. Bowel sounds are normal. She exhibits no distension. There is no tenderness. There is no rebound and no guarding.  Musculoskeletal: She exhibits no edema.          Assessment & Plan:

## 2011-06-10 NOTE — Assessment & Plan Note (Addendum)
New problem. Atypical neck pain,  Not a classic musculoskeletal presentation, does not have pharyngitis. Plan: Chest x-ray, soft tissue neck x-rays If symptoms severe ---->  ER Reassess next week ADDENDUM, x-rays negative, I called the patient, she is aware. Plan is the same.

## 2011-06-14 ENCOUNTER — Ambulatory Visit: Payer: Managed Care, Other (non HMO) | Admitting: Internal Medicine

## 2011-06-15 ENCOUNTER — Ambulatory Visit: Payer: Managed Care, Other (non HMO) | Admitting: Internal Medicine

## 2011-08-16 LAB — URINALYSIS, ROUTINE W REFLEX MICROSCOPIC
Ketones, ur: NEGATIVE
Nitrite: NEGATIVE
pH: 6

## 2011-08-16 LAB — POCT CARDIAC MARKERS
Myoglobin, poc: 55.5
Operator id: 1211
Troponin i, poc: <0.05

## 2011-08-16 LAB — DIFFERENTIAL
Eosinophils Absolute: 0
Lymphocytes Relative: 10 — ABNORMAL LOW
Lymphs Abs: 0.7
Monocytes Relative: 4
Neutro Abs: 6.2
Neutrophils Relative %: 85 — ABNORMAL HIGH

## 2011-08-16 LAB — COMPREHENSIVE METABOLIC PANEL
ALT: 39 — ABNORMAL HIGH
AST: 49 — ABNORMAL HIGH
Alkaline Phosphatase: 35 — ABNORMAL LOW
CO2: 28
Calcium: 8.2 — ABNORMAL LOW
GFR calc Af Amer: >60
GFR calc non Af Amer: >60
Glucose, Bld: 101 — ABNORMAL HIGH
Potassium: 4.5
Sodium: 138
Total Protein: 6.2

## 2011-08-16 LAB — PREGNANCY, URINE: Preg Test, Ur: NEGATIVE

## 2011-08-16 LAB — RAPID URINE DRUG SCREEN, HOSP PERFORMED
Barbiturates: NOT DETECTED
Cocaine: NOT DETECTED

## 2011-08-16 LAB — ETHANOL: Alcohol, Ethyl (B): <5

## 2011-08-16 LAB — CBC
Hemoglobin: 12.4
MCHC: 34.1
RBC: 3.94

## 2011-11-01 DIAGNOSIS — M329 Systemic lupus erythematosus, unspecified: Secondary | ICD-10-CM

## 2011-11-01 HISTORY — DX: Systemic lupus erythematosus, unspecified: M32.9

## 2011-11-14 ENCOUNTER — Ambulatory Visit (INDEPENDENT_AMBULATORY_CARE_PROVIDER_SITE_OTHER): Payer: Managed Care, Other (non HMO) | Admitting: Internal Medicine

## 2011-11-14 VITALS — BP 106/80 | HR 100 | Temp 98.3°F | Ht 60.0 in | Wt 134.0 lb

## 2011-11-14 DIAGNOSIS — M329 Systemic lupus erythematosus, unspecified: Secondary | ICD-10-CM | POA: Insufficient documentation

## 2011-11-14 DIAGNOSIS — M2550 Pain in unspecified joint: Secondary | ICD-10-CM

## 2011-11-14 NOTE — Assessment & Plan Note (Addendum)
Presents today with polyarthralgias, no evidence of synovitis on exam. She also had ill defined left facial pain and left ear pain, currently taking doxycycline as prescribed by the urgent care 2 days ago. ENT exam benign. Etiology of pain unclear, Viral infection? Plan: Labs to rule out an inflammatory process. TSH

## 2011-11-14 NOTE — Patient Instructions (Signed)
Take ibuprofen over-the-counter, always take it with food. Call if not improving in the next 2 weeks or if you have a rash, fever

## 2011-11-14 NOTE — Progress Notes (Signed)
  Subjective:    Patient ID: Rachel Ochoa, female    DOB: July 10, 1971, 41 y.o.   MRN: [id number]  HPI Acute visit CC today is a 2 week h/o arthralgias and periarticular myalgias: elbows, shoulders, knees Also went to a UC 2 days ago c/o L facial pain / L ear , was Rx doxy (dx?)  Past Medical History: h/o epilepsy , better by age 36 elevated BP without diagnosis of hypertension  2-11 DYSLIPIDEMIA  IBS -- sees GI  LEUKOCYTOPENIA - thrombocytopenia UNSPECIFIED -- sees hematology , Rx observation  Migraine HAs w/ aura , saw neuro 12-10  Past Surgical History: tonsilectomy Abdominal hysterectomy, NO oophorectomy Cholecystectomy Breast reduction  Family History: Family History of Breast Cancer:Maternal Aunt Family History of Diabetes: Father HTN-- F DM-- F  Social History: From [place] Married Occupation: Homemaker Alcohol Use --- rarely tobacco--no Illicit Drug Use - no   Review of Systems No rash, no Fever, occ chills Some ST but no other URI type of sx Not taking any new meds except for occasional zyrtec occ N-V (has IBS)    Objective:   Physical Exam  Constitutional: She is oriented to person, place, and time. She appears well-developed and well-nourished. No distress.  HENT:  Head: Normocephalic and atraumatic.  Right Ear: External ear normal.  Left Ear: External ear normal.  Mouth/Throat: No oropharyngeal exudate.  Neck: No thyromegaly present.  Cardiovascular: Normal rate, regular rhythm and normal heart sounds.   No murmur heard. Pulmonary/Chest: Effort normal and breath sounds normal. No respiratory distress. She has no wheezes. She has no rales.  Musculoskeletal:       Elbows, hands, wrists without evidence of synovitis. No subcutaneous upper extremity nodules. Knees are also normal to inspection.   Lymphadenopathy:    She has no cervical adenopathy.  Neurological: She is alert and oriented to person, place, and time.  Skin: No rash noted.  She is not diaphoretic.       Assessment & Plan:

## 2011-11-15 ENCOUNTER — Encounter: Payer: Self-pay | Admitting: Internal Medicine

## 2011-11-15 LAB — CK: Total CK: 79 U/L (ref 7–177)

## 2011-11-15 LAB — CBC WITH DIFFERENTIAL/PLATELET
Basophils Relative: 0.1 % (ref 0.0–3.0)
Eosinophils Relative: 0.8 % (ref 0.0–5.0)
Monocytes Relative: 9.3 % (ref 3.0–12.0)
Neutrophils Relative %: 66.8 % (ref 43.0–77.0)
Platelets: 102 10*3/uL — ABNORMAL LOW (ref 150.0–400.0)
RBC: 3.6 Mil/uL — ABNORMAL LOW (ref 3.87–5.11)
WBC: 4 10*3/uL — ABNORMAL LOW (ref 4.5–10.5)

## 2011-11-15 LAB — TSH: TSH: 1.14 u[IU]/mL (ref 0.35–5.50)

## 2011-11-15 LAB — SEDIMENTATION RATE: Sed Rate: 103 mm/hr — ABNORMAL HIGH (ref 0–22)

## 2011-11-17 ENCOUNTER — Ambulatory Visit (INDEPENDENT_AMBULATORY_CARE_PROVIDER_SITE_OTHER): Payer: Managed Care, Other (non HMO) | Admitting: Internal Medicine

## 2011-11-17 ENCOUNTER — Ambulatory Visit (INDEPENDENT_AMBULATORY_CARE_PROVIDER_SITE_OTHER)
Admission: RE | Admit: 2011-11-17 | Discharge: 2011-11-17 | Disposition: A | Discharge status: Home / Self Care | Payer: Managed Care, Other (non HMO) | Source: Ambulatory Visit | Attending: Internal Medicine | Admitting: Internal Medicine

## 2011-11-17 ENCOUNTER — Other Ambulatory Visit: Payer: Self-pay | Admitting: Internal Medicine

## 2011-11-17 ENCOUNTER — Encounter: Payer: Self-pay | Admitting: Internal Medicine

## 2011-11-17 VITALS — BP 114/70 | Temp 100.4°F | Wt 135.0 lb

## 2011-11-17 DIAGNOSIS — M2550 Pain in unspecified joint: Secondary | ICD-10-CM

## 2011-11-17 MED ORDER — HYDROCODONE-ACETAMINOPHEN 7.5-750 MG PO TABS: 1.0000 | ORAL_TABLET | Freq: Four times a day (QID) | ORAL | Status: AC | PRN

## 2011-11-17 NOTE — Progress Notes (Signed)
  Subjective:    Patient ID: Rachel Ochoa, female    DOB: February 18, 1971, 41 y.o.   MRN: [id number]  HPI Acute visit, since the last time I saw her 2 days ago she is not getting better. Continue with the pain in the elbows, shoulders and knees. Continue with ill defined left facial pain around the TMJ. She self discontinued doxycycline as it was not helping. Unable to get a better description of symptoms despite multiple questions.  Past Medical History:  h/o epilepsy , better by age 37  elevated BP without diagnosis of hypertension 2-11  DYSLIPIDEMIA  IBS -- sees GI  LEUKOCYTOPENIA - thrombocytopenia UNSPECIFIED -- sees hematology , Rx observation  Migraine HAs w/ aura , saw neuro 12-10   Past Surgical History:  tonsilectomy  Abdominal hysterectomy, NO oophorectomy  Cholecystectomy  Breast reduction   Review of Systems Today she was found to be febrile at the office, she admits to subjective fever and chills in the last few days. Denies any headaches or any rash. She is having some nausea, V x 1, no diarrhea No cough or URI symptoms except for ST x ~ 1 week  No dysuria or gross hematuria. No conjunctivitis type of symptoms     Objective:   Physical Exam  Constitutional: She is oriented to person, place, and time. She appears well-developed. No distress.       Mild distress due to pain  HENT:  Head: Normocephalic and atraumatic.  Right Ear: External ear normal.  Left Ear: External ear normal.  Mouth/Throat: No oropharyngeal exudate.       Face symmetric  Cardiovascular: Normal rate, regular rhythm and normal heart sounds.   Pulmonary/Chest: Effort normal. No respiratory distress. She has no wheezes. She has no rales.  Abdominal: Soft. Bowel sounds are normal. She exhibits no distension. There is no tenderness. There is no rebound.  Musculoskeletal: She exhibits no edema.       Hands and wrists without synovitis  Neurological: She is alert and oriented to person, place,  and time.  Skin: She is not diaphoretic.  Psychiatric: She has a normal mood and affect. Her behavior is normal. Judgment and thought content normal.          Assessment & Plan:

## 2011-11-17 NOTE — Assessment & Plan Note (Addendum)
~   3 weeks h/o large, symmetric polyarthralgia with a sed rate of 103, fever, normal TSH, chronic and stable thrombocytopenia , slt low WBCs. I suspect she has  inflammatory arthritis, I discussed the case with rheumatology. They will be able to see her next week, in the meantime will order: ANA, rheumatoid factor, anti-CCP, C-reactive protein. There is no much to think about Lyme disease (patient denies tick bites) Because she is running a fever, I also going to do a throat culture, chest x-ray and a urine culture. Additionally, we will treat her pain with Vicodin. ER if symptoms severe, headache, rash. She also complained of the ill-defined left face/TMJ  discomfort. On chart review, this is ongoing for months, she actually saw ENT 7-12.  F2F > 45 minutes coordinating pt care

## 2011-11-17 NOTE — Patient Instructions (Addendum)
Please call to see Dr. Azzie Roup Monday 7.30 AM. Is very important that you keep your appointment. Go to the ER if he has severe fever, headache or a rash Get the x-ray done. Take pain medication as needed     [facility] [address] [place], [place] [address]

## 2011-11-18 LAB — RHEUMATOID FACTOR: Rhuematoid fact SerPl-aCnc: <10 IU/mL (ref <=14)

## 2011-11-18 LAB — ANA: Anti Nuclear Antibody(ANA): POSITIVE — AB

## 2012-04-13 ENCOUNTER — Ambulatory Visit (INDEPENDENT_AMBULATORY_CARE_PROVIDER_SITE_OTHER): Payer: Managed Care, Other (non HMO) | Admitting: Gynecology

## 2012-04-13 ENCOUNTER — Encounter: Payer: Self-pay | Admitting: Gynecology

## 2012-04-13 ENCOUNTER — Ambulatory Visit (INDEPENDENT_AMBULATORY_CARE_PROVIDER_SITE_OTHER): Payer: Managed Care, Other (non HMO)

## 2012-04-13 VITALS — BP 120/78

## 2012-04-13 DIAGNOSIS — R109 Unspecified abdominal pain: Secondary | ICD-10-CM

## 2012-04-13 DIAGNOSIS — R1030 Lower abdominal pain, unspecified: Secondary | ICD-10-CM

## 2012-04-13 LAB — URINALYSIS W MICROSCOPIC + REFLEX CULTURE
Casts: NONE SEEN
Crystals: NONE SEEN
Glucose, UA: NEGATIVE mg/dL
Leukocytes, UA: NEGATIVE
Protein, ur: NEGATIVE mg/dL
Specific Gravity, Urine: 1.015 (ref 1.005–1.030)
WBC, UA: NONE SEEN WBC/hpf (ref <3)
pH: 7 (ref 5.0–8.0)

## 2012-04-13 MED ORDER — MEDROXYPROGESTERONE ACETATE 150 MG/ML IM SUSP
150.0000 mg | Freq: Once | INTRAMUSCULAR | Status: AC
  Administered 2012-04-13: 150 mg via INTRAMUSCULAR

## 2012-04-13 NOTE — Progress Notes (Signed)
41 year old patient presented to the office today complaining of low abdominal discomfort especially radiating to her back. She denied any dysuria or frequency or fever chills or nausea or vomiting. She has had some dyspareunia. The pain is mostly localized her left. She denied any vaginal discharge. She was diagnosed with lupus in January of 2013 and is currently on plaquinill and prednisone. Her urinalysis today demonstrated 7-10 RBCs and rare bacteria and it was submitted for culture.  Exam: Abdomen: Soft slightly tender left lower quadrant with minimal rebound Pelvic: Bartholin urethra Skene glands within normal limits Vagina: No lesions or discharge Cervix: Absent  Uterus: Absent Bimanual exam: Tenderness and questionable fullness in the left adnexa Rectal: Not done  Ultrasound: Vaginal cuff and right ovary appeared normal. A left ovarian complex thickwalled vascular cyst measuring 19 x 17 x 16 mm was noted. No free fluid in the cul-de-sac.  Assessment/plan: Left lower quadrant pain attributed to small ovarian cyst? Patient will be receiving Depo-Provera 150 mg IM today and she will return back in 2 months for followup ultrasound. She will take nonsteroidal at home when necessary. She was not in any acute distress today. She was offered Toradol IM and she declined. We'll wait for results of urine culture as well and notify patient accordingly. Literature information on ovarian cyst in Spanish was provided. All questions were answered and we'll follow accordingly.

## 2012-04-13 NOTE — Patient Instructions (Addendum)
Quiste ovrico (Ovarian Cyst) Los ovarios son pequeos rganos que se encuentran a cada lado del tero. Los ovarios son los rganos que producen las hormonas femeninas, estrgeno y Education officer, museum. Un quiste en el ovario es una bolsa llena de lquido que puede variar en tamao. Es normal que se formen pequeos quistes en las mujeres en edad de procrear y que an tienen sus perodos Designer, jewellery. Este tipo de quiste se denomina quiste folicular que se transforma en un quiste ovulatorio (quiste del cuerpo lteo) despus de producir los vulos. Si la mujer no queda embarazada, desaparece sin ninguna intervencin. Existen otros tipos de quistes de ovario que pueden causar problemas y necesitan ser tratados. El problema ms grave es que el quiste sea canceroso. Debe advertirse que en las mujeres menopusicas que presentan un quiste de ovario, existe un mayor riesgo de que ese quiste sea canceroso. Deben evaluarse muy rpida y [place], y IT sales professional. Esto es ms importante en las mujeres menopusicas debido al elevado porcentaje de cncer de ovario durante este perodo. CAUSAS Y TIPOS DE CNCER DE OVARIO:  QUISTE FUNCIONAL: El quiste de folculo o cuerpo lteo es un quiste funcional que aparece todos los meses durante la ovulacin, con el ciclo menstrual. Si la mujer no queda embarazada, desaparecen con el prximo ciclo menstrual. Generalmente los quistes funcionales no presentan sntomas.   ENDOMETRIOMA: este quiste aparece en la superficie del tejido del tero. Un quiste se forma en el interior o [employer]. Cada mes se desarrolla un poco ms debido a la sangre del perodo menstrual. Tambin se denomina "quiste de chocolate" debido a que est lleno de sangre que se vuelve color marrn. Este tipo de quiste causa dolor en la zona inferior del abdomen durante las relaciones sexuales y durante el perodo menstrual.   CISTADENOMA: Se desarrolla a partir de las clulas externas del ovario.  Generalmente no son cancerosos. Pueden llegar a ser de gran tamao y causar dolor en la zona baja del abdomen y [employer] sexuales. Este tipo de quiste puede retorcerse e interrumpir el flujo de [place], lo que causa un dolor muy intenso. Tambin puede romperse y Horticulturist, commercial.   QUISTE DERMOIDE: generalmente este tipo de quiste aparece en ambos ovarios. Puede haber diferentes tipos de tejidos en el quiste. Por ejemplo tejidos de piel, dientes, pelos o cartlago. En general no dan sntomas, excepto que sean muy grandes. Los quistes dermoides rara vez son cancerosos.   OVARIO POLIQUSTICO: es una enfermedad rara relacionada con trastornos hormonales que produce muchos quistes pequeos en ambos ovarios. Estos quistes son similares a los quistes de folculo pero nunca producen vulos y se transforman en cuerpo lteo. Pueden causar aumento del [employer], infertilidad, acn, aumento del vello facial y corporal y falta de perodos menstruales o perodos anormales. Muchas mujeres que sufren este problema presentan diabetes tipo 2. La causa exacta de este problema es desconocida. Un ovario poliqustico rara vez es canceroso.   QUISTE OVRICO TECALUTESTICO Aparece cuando hay demasiada hormona (gonadotrofina corinica humana), la que sobreestimula al ovario para producir vulos. Se observan con frecuencia cuando el mdico estimula los ovarios para la fertilizacin in vitro (bebs de probeta).   QUISTE LUTENICO: Aparece durante el embarazo. En algunos casos raros, produce una obstruccin del canal de parto. Generalmente desaparece despus del parto.  SNTOMAS  Dolor o molestias en la pelvis.   Dolor durante las [employer].   Aumento de la inflamacin en el abdomen.   Perodos menstruales anormales.  Aumento del [employer] perodos [place].   Deja de menstruar y no est embarazada.  DIAGNSTICO El diagnstico puede realizarse durante:  Los exmenes plvicos anuales  o de rutina (frecuente).   Ecografas   Radiografas de la pelvis.   Tomografa computada   Resonancia magntica..   Anlisis de sangre.  TRATAMIENTO  El tratamiento slo consiste en que el mdico controle el quiste [place], durante 2  3 meses. Muchos desaparecen espontnemente, especialmente los quistes funcionales.   Puede aspirarse (secarse) con Marella Bile larga observndolo en una ecografa, o por laparoscopa (insertando un tubo en la pelvis a travs de una pequea incisin).   El quiste puede extirparse con laparoscopa.   En algunos casos es necesario extirparlo a travs de una incisin en la zona inferior del abdomen.   El tratamiento hormonal se utiliza para Restaurant manager, fast food ciertos tipos de [place].   Las pldoras anticonceptivas pueden utilizarse para Restaurant manager, fast food otros tipos.  INSTRUCCIONES PARA EL CUIDADO DOMICILIARIO Siga las indicaciones del profesional con respecto a:  Medicamentos   Visitas de control para evaluar y Pharmacologist.   Puede ser necesario que tenga que volver o concertar una cita con otro profesional para descubrir la causa exacta del quiste, si su mdico no es Research scientist (physical sciences).   Realice un examen plvico y un [place] todos los aos, segn las indicaciones.   Informe al mdico si tubo un quiste de ovario en el pasado.  SOLICITE ATENCIN MDICA SI:  Los perodos se atrasan, son irregulares, le faltan o son dolorosos.   El dolor abdominal (en el vientre) o en la pelvis persisten.   El abdomen se agranda o se hincha.   Siente una opresin en la vejiga o tiene problemas para vaciarla completamente.   Tiene dolor durante las [employer].   Tiene la sensacin de hinchazn, presin o molestias en el abdomen.   Pierde peso sin razn aparente.   Siente un Engineer, maintenance (IT).   Est constipada.   Pierde el apetito.   Aparece acn.   Aumenta el vello facial y Personal assistant.   Lenora Boys de peso sin hacer modificaciones en su actividad  fsica y en su dieta habitual.   Sospecha que est embarazada.  SOLICITE ATENCIN MDICA DE INMEDIATO SI:  Siente dolor abdominal cada vez ms intenso.   Si tiene ganas de vomitar (nuseas).   Le sube repentinamente la fiebre.   Siente dolor abdominal al mover el intestino.   Sus perodos menstruales son ms abundantes que lo habitual.  Document Released: 07/27/2005 Document Revised: 10/06/2011 [facility] Patient Information 2012 [place], [place].  Endometriosis (Endometriosis) La endometriosis es un trastorno que se produce cuando existen porciones de endometrio (el recubrimiento del tero) fuera de su ubicacin normal. Puede ocurrir en muchos lugares prximos al tero (matriz), pero generalmente se produce en los ovarios, en las trompas de [place], en la vagina (canal de parto) y en los intestinos que se encuentran cerca del tero. Debido a que el tero elimina (expulsa) su recubrimiento todos los meses (menstruacin), hay una Cabin crew en el que el tejido endometrial est ubicado. SNTOMAS Generalmente no se presentan sntomas (problemas); sin embargo, debido a que la sangre irrita los tejidos que normalmente no estn expuestos a ella, cuando se producen los sntomas, estos varan segn Immunologist al cual se haya desplazado el endometrio. Entre los sntomas se incluyen el dolor de espalda y el dolor abdominal (vientre). Los perodos pueden ser ms abundantes y las relaciones sexuales dolorosas. Puede haber infertilidad. Daphane Shepherd  podr sufrir todos estos sntomas al [employer] o no, o puede haber meses en los que no tenga ningn sntoma. Aunque los sntomas se producen principalmente durante las menstruaciones, tambin pueden aparecer en la mitad del ciclo y generalmente terminan con la menopausia. DIAGNSTICO El profesional que la asiste podr indicarle un examen de sangre y de [place] para descartar otros trastornos. Otra prueba frecuente en estos casos es el [place], un  procedimiento indoloro que utiliza ondas de sonido para hacer una ecografa del tejido anormal que indica endometriosis. Si siente dolor al mover el intestino durante el perodo, el profesional que la asiste podr indicarle un enema de bario (radiografa de la parte inferior del intestino), para tratar de Contractor origen del dolor. A veces se confirma por medio de una laparoscopa. La laparoscopa es un procedimiento en el que el profesional observa el interior del abdomen con un laparoscopio (un pequeo telescopio con forma de lpiz). El profesional podr tomar una pequea muestra de tejido (biopsia) de los tejidos anormales para confirmar o Psychiatric nurse problema. Los tejidos se envan al laboratorio y un patlogo los observa bajo el microscopio para dar un diagnstico. TRATAMIENTO Una vez que se realiza el diagnstico, puede tratarse con la destruccin del tejido endometrial desplazado, utilizando calor (diatermia), lser, escisin (corte), o por medios qumicos. Tambin puede tratarse con terapia hormonal. Cuando se utiliza la terapia hormonal, se eliminan las [place], por lo tanto se elimina la exposicin mensual a la sangre del tejido endometrial desplazado. Slo en los casos graves es necesario realizar una histerectoma con extirpacin de las trompas, el tero y los ovarios. INSTRUCCIONES PARA EL CUIDADO DOMICILIARIO  Utilice los medicamentos de venta libre o de prescripcin para Chief Technology Officer, el malestar o la [place], segn se lo indique el profesional que lo asiste.   Evite las actividades que producen dolor, incluyendo las [employer].   No tome aspirinas ya que puede aumentar las hemorragias cuando no recibe terapia hormonal.   Consulte al profesional que la asiste si siente dolor o tiene problemas que no puede controlar con Scientist, research (medical).  SOLICITE ATENCIN MDICA DE INMEDIATO SI:  El dolor es intenso y no responde a Tourist information centre manager.   Comienza a sentir nuseas y vmitos o  no puede [employer].   El dolor se localiza en la zona inferior derecha del abdomen (posible apendicitis).   Presenta hinchazn o aumento del dolor en el abdomen.   Tiene fiebre.   Observa sangre en la materia fecal.  EST SEGURO QUE:   Comprende las instrucciones para el alta mdica.   Controlar su enfermedad.   Solicitar atencin mdica de inmediato segn las indicaciones.  Document Released: 10/17/2005 Document Revised: 10/06/2011 [facility] Patient Information 2012 [place], [place].

## 2012-04-15 LAB — URINE CULTURE: Organism ID, Bacteria: NO GROWTH

## 2012-06-04 ENCOUNTER — Ambulatory Visit (INDEPENDENT_AMBULATORY_CARE_PROVIDER_SITE_OTHER): Payer: Managed Care, Other (non HMO) | Admitting: Gynecology

## 2012-06-04 ENCOUNTER — Encounter: Payer: Self-pay | Admitting: Gynecology

## 2012-06-04 VITALS — BP 120/82 | Ht 58.75 in | Wt 138.0 lb

## 2012-06-04 DIAGNOSIS — M329 Systemic lupus erythematosus, unspecified: Secondary | ICD-10-CM

## 2012-06-04 DIAGNOSIS — R635 Abnormal weight gain: Secondary | ICD-10-CM

## 2012-06-04 DIAGNOSIS — N83209 Unspecified ovarian cyst, unspecified side: Secondary | ICD-10-CM

## 2012-06-04 DIAGNOSIS — Z01419 Encounter for gynecological examination (general) (routine) without abnormal findings: Secondary | ICD-10-CM

## 2012-06-04 LAB — CBC WITH DIFFERENTIAL/PLATELET
Basophils Relative: 0 % (ref 0–1)
HCT: 37.7 % (ref 36.0–46.0)
Hemoglobin: 12.3 g/dL (ref 12.0–15.0)
MCHC: 32.6 g/dL (ref 30.0–36.0)
Monocytes Absolute: 0.3 10*3/uL (ref 0.1–1.0)
Monocytes Relative: 6 % (ref 3–12)
Neutro Abs: 3.5 10*3/uL (ref 1.7–7.7)

## 2012-06-04 NOTE — Progress Notes (Signed)
Rachel Ochoa [birth year] [id number]   History:    41 y.o.  for annual gyn exam who was seen in the office in June 14 complaining of low abdominal discomfort. Patient has a prior history of total abdominal hysterectomy. An ultrasound had been done at office visit which had demonstrated the following:  Vaginal cuff and right ovary appeared normal. A left ovarian complex thickwalled vascular cyst measuring 19 x 17 x 16 mm was noted. No free fluid in the cul-de-sac.  Patient has recently been diagnosed with lupus and is under the care of Dr. Dareen Piano for which she's currently on Actonel and he is tapering her down on her prednisone. She is taking one calcium and vitamin D tablet daily. She will be scheduling her overdue mammogram.   Past medical history,surgical history, family history and social history were all reviewed and documented in the EPIC chart.  Gynecologic History Patient's last menstrual period was 03/01/2004. Contraception: Hysterectomy Last Pap: 2012 . Results were: normal Last mammogram: 2012 . Results were: normal  Obstetric History OB History    Grav Para Term Preterm Abortions TAB SAB Ect Mult Living   4 3 3  1  1   3      # Outc Date GA Lbr Len/2nd Wgt Sex Del Anes PTL Lv   1 TRM     F SVD  No Yes   2 TRM     M SVD  No Yes   3 TRM     M SVD  No Yes   4 SAB                ROS: A ROS was performed and pertinent positives and negatives are included in the history.  GENERAL: No fevers or chills. HEENT: No change in vision, no earache, sore throat or sinus congestion. NECK: No pain or stiffness. CARDIOVASCULAR: No chest pain or pressure. No palpitations. PULMONARY: No shortness of breath, cough or wheeze. GASTROINTESTINAL: No abdominal pain, nausea, vomiting or diarrhea, melena or bright red blood per rectum. GENITOURINARY: No urinary frequency, urgency, hesitancy or dysuria. MUSCULOSKELETAL: No joint or muscle pain, no back pain, no recent trauma. DERMATOLOGIC: No rash, no  itching, no lesions. ENDOCRINE: No polyuria, polydipsia, no heat or cold intolerance. No recent change in weight. HEMATOLOGICAL: No anemia or easy bruising or bleeding. NEUROLOGIC: No headache, seizures, numbness, tingling or weakness. PSYCHIATRIC: No depression, no loss of interest in normal activity or change in sleep pattern.     Exam: chaperone present  BP 120/82  Ht 4' 10.75" (1.492 m)  Wt 138 lb (62.596 kg)  BMI 28.11 kg/m2  LMP 03/01/2004  Body mass index is 28.11 kg/(m^2).  General appearance : Well developed well nourished female. No acute distress HEENT: Neck supple, trachea midline, no carotid bruits, no thyroidmegaly Lungs: Clear to auscultation, no rhonchi or wheezes, or rib retractions  Heart: Regular rate and rhythm, no murmurs or gallops Breast:Examined in sitting and supine position were symmetrical in appearance, no palpable masses or tenderness,  no skin retraction, no nipple inversion, no nipple discharge, no skin discoloration, no axillary or supraclavicular lymphadenopathy Abdomen: no palpable masses or tenderness, no rebound or guarding Extremities: no edema or skin discoloration or tenderness  Pelvic:  Bartholin, Urethra, Skene Glands: Within normal limits             Vagina: No gross lesions or discharge  Cervix:  absent  Uterus   Adnexa  Without masses or tenderness  Anus and perineum  normal  Rectovaginal  normal sphincter tone without palpated masses or tenderness             Hemoccult not done     Assessment/Plan:  41 y.o. female for annual exam will return to the office in 2 weeks for followup ultrasound. He will be 2 months from her last ultrasound and after she received Depo-Provera to see the cyst would resolve. She was in no complaints today. The following labs will be drawn today: CBC, cholesterol, urinalysis, TSH, and random blood sugar. She is encouraged to increase her calcium and vitamin D to twice a day. We discussed importance of  weightbearing exercises for osteoporosis prevention. We did discuss the new Pap smear screening guidelines. She has had no prior history of normal Pap smears and now she's had a hysterectomy for benign entity she will no longer needs Pap smears. She was reminded to followup with her mammogram and to continue to her monthly self breast examinations.    [name], 11:35 AM 06/04/2012

## 2012-06-04 NOTE — Patient Instructions (Addendum)
Mantenimiento de la salud en las mujeres (Health Maintenance, Females) Un estilo de vida saludable y los cuidados preventivos pueden favorecer la salud y el bienestar.   Haga exmenes regulares de la salud en general, dentales y de los ojos.   Consuma una dieta saludable. Los alimentos como vegetales, frutas, granos enteros, productos lcteos descremados y protenas magras contienen los nutrientes que usted necesita sin necesidad de consumir muchas caloras. Disminuya el consumo de alimentos con alto contenido de grasas slidas, azcar y sal agregadas. Si es necesario, pdaleinformacin acerca de una dieta adecuada a su mdico.   La actividad fsica regular es una de las cosas ms importantes que puede hacer por su salud. Los adultos deben hacer al menos 150 minutos de ejercicios de intensidad moderada (cualquier actividad que aumente la frecuencia cardaca y lo haga transpirar) cada semana. Adems, la mayora de los adultos necesita ejercicios de fortalecimiento muscular 2  ms das por semana.    Mantenga un peso saludable. El ndice de masa corporal (IMC) es una herramienta que identifica posibles problemas con el peso. Proporciona una estimacin de la grasa corporal basndose en el peso y la altura. El mdico podr determinar su IMC y podr ayudarlo a lograr o mantener un peso saludable. Para los adultos de 20 aos o ms:   Un IMC menor a 18,5 se considera bajo peso.   Un IMC entre 18,5 y 24,9 es normal.   Un IMC entre 25 y 29,9 es sobrepeso.   Un IMC entre 30 o ms es obesidad.   Mantenga un nivel normal de lpidos y colesterol en sangre practicando actividad fsica y minimizando la ingesta de grasas saturadas. Consuma una dieta balanceada e incluya variedad de frutas y vegetales. Los anlisis de lpidos y colesterol en sangre deben comenzar a los 20 aos y repetirse cada 5 aos. Si los niveles de colesterol son altos, tiene ms de 50 aos o tiene riesgo elevado de sufrir enfermedades  cardacas, necesitar controlarse con ms frecuencia.Si tiene niveles elevados de lpidos y colesterol, debe recibir tratamiento con medicamentos, si la dieta y el ejercicio no son efectivos.   Si fuma, consulte con el profesional acerca de las opciones para dejar de hacerlo. Si no lo hace, no comience.   Si est embarazada no beba alcohol. Si est amamantando, beba alcohol con prudencia. Si elige beber alcohol, no se exceda de 1 medida por da. Se considera una medida a 12 onzas (355 ml) de cerveza, 5 onzas (148 ml) de vino, o 1,5 onzas (44 ml) de licor.   Evite el alcohol y el consumo de drogas. No comparta agujas. Pida ayuda si necesita asistencia o instrucciones con respecto a abandonar el consumo de alcohol, cigarrillos o drogas.   La hipertensin arterial causa enfermedades cardacas y aumenta el riesgo de ictus. Debe controlar su presin arterial al menos cada 1 o 2 aos. La presin arterial elevada que persiste debe tratarse con medicamentos si la prdida de peso y el ejercicio no son efectivos.   Si tiene entre 55 y 79 aos, consulte a su mdico si debe tomar aspirina para prevenir enfermedades cardacas.   Los anlisis para la diabetes incluyen la toma de una muestra de sangre para controlar el nivel de azcar en la sangre durante el ayuno. Debe hacerlo cada 3 aos despus de los 45 aos si est dentro de su peso normal y sin factores de riesgo para la diabetes. Las pruebas deben comenzar a edades tempranas o llevarse a cabo con ms frecuencia   si tiene sobrepeso y al menos 1 factor de riesgo para la diabetes.   Las evaluaciones para detectar el cncer de mama son un mtodo preventivo fundamental para las mujeres. Debe practicar la "autoconciencia de las mamas". Esto significa que debe reconocer la apariencia normal de sus mamas y como las siente y pudiendo incluir un autoexamen de mamas. Si detecta algn cambio, no importa cun pequeo sea, debe informarlo a su mdico. Las mujeres entre 20 y  40 aos deben hacer un examen clnico de las mamas como parte del examen regular de salud, cada 1 a 3 aos. Despus de los 40 aos deben hacerlo todos los aos. Deben hacerse una mamografa radografa de mamas ) cada ao, comenzando a los 40 aos. Las mujeres con historia familiar de cncer de mama deben hablar con el mdico para hacer un estudio gentico. Las que tienen ms riesgo deben hacerse resonancia magntica y una mamografa todos los aos.   Un test de Pap se realiza para diagnosticar cncer de cuello de tero. Las mujeres deben hacerse un test de Pap a partir de los 21 aos. Entre los 21 y los 29 aos debe repetirse cada dos aos. Luego de los 30 aos, debe realizarse un test de Pap cada tres aos siempre que los 3 estudios anteriores sean normales. Si le han realizado una histerectoma por un problema que no era cncer u otra enfermedad que podra causar cncer, ya no necesitar un test de Pap. Si tiene entre 65 y 70 aos y ha tenido un test de Pap normal en los ltimos 10 aos, ya no ser necesario realizarlo. Si ha recibido un tratamiento para el cncer cervical o para una enfermedad que podra causar cncer, necesitar realizar un test de Pap y controles durante al menos 20 aos de concluir el tratamiento. Si no se ha hecho el examen con regularidad, debern volver a evaluarse los factores de riesgo (como el tener un nuevo compaero sexual) para determinar si debe volver a realizarse los estudios. Algunas mujeres sufren problemas mdicos que aumentan la probabilidad de contraer cncer cervical. En estos casos, el mdico podr indicar que se realice el test de Pap con ms frecuencia.   La prueba del virus del papiloma humano (VPH) es un anlisis adicional que puede usarse para detectar cncer de cuello de tero. Esta prueba busca la presencia del virus que causa los cambios en el cuello. Las clulas que se recolectan durante el test de Pap pueden usarse para el VPH. La prueba para el VPH puede  usarse para evaluar a mujeres de ms de 30 aos y debe usarse en mujeres de cualquier edad cuyos resultados del test de Pap no sean claros. Despus de los 30 aos, las mujeres deben hacerse el anlisis para el VPH con la misma frecuencia que el test de Pap.   El cncer colorectal puede detectarse y con frecuencia puede prevenirse. La mayor parte de los estudios de rutina comienzan a los 50 aos y continan hasta los 75 aos. Sin embargo, el mdico podr aconsejarle que lo haga antes, si tiene factores de riesgo para el cncer de colon. Una vez por ao, el profesional le dar un kit de prueba para hallar sangre oculta en la materia fecal. La utilizacin de un tubo con una pequea cmara en su extremo para examinar directamente el colon (sigmoidoscopa o colonoscopa), puede detectar formas temprana de cncer colorectal. Hable con su mdico si tiene 50 aos, cuando comience con los estudios de rutina. El examen directo del   colon debe repetirse cada 5 a 10 aos, hasta los 75 aos, excepto que se encuentren formas tempranas de plipos precancerosos o pequeos bultos.   Se recomienda realizar un anlisis de sangre para detectar hepatitis C a todas las personas nacidas entre 1945 y 1965, y a todo aquel que tenga un riesgo conocido de haber contrado esta enfermedad.   Practique el sexo seguro. Use condones y evite las prcticas sexuales riesgosas para disminuir el contagio de enfermedades de transmisin sexual. Las mujeres sexualmente activas de 25 aos o menos deben controlarse para descartar clamidia, que es una infeccin de transmisin sexual frecuente. Las mujeres mayores que tengan mltiples compaeros tambin deben hacerse el anlisis para detectar clamidia. Se recomienda realizar anlisis para detectar otras enfermedades de transmisin sexual si es sexualmente activa y tiene riesgos.   La osteoporosis es una enfermedad en la que los huesos pierden los minerales y la fuerza por el avance de la edad. El  resultado pueden ser fracturas graves en los huesos. El riesgo de osteoporosis puede identificarse con una prueba de densidad sea. Las mujeres de ms de 65 aos y las que tengan riesgos de sufrir fracturas u osteoporosis deben pedir consejo a su mdico. Consulte a su mdico si debe tomar un suplemento de calcio o de vitamina D para reducir el riesgo de osteoporosis.   La menopausia se asocia a sntomas y riesgos fsicos. Se dispone de una terapia de reemplazo hormonal para disminuir los sntomas y los riesgos. Consulte a su mdico para saber si la terapia de reemplazo hormonal es conveniente para usted.   Use una pantalla solar con un factor SPF de 30 o mayor. Aplique pantalla de manera libre y repetida a lo largo del da. Pngase al resguardo del sol cuando la sombra sea ms pequea que usted. Protjase usando mangas y pantalones largos, un sombrero de ala ancha y gafas para el sol todo el ao, siempre que se encuentre en el exterior.   Informe a su mdico si aparecen nuevos lunares o los que tiene se modifican, especialmente en forma y color. Tambin notifique al mdico si un lunar es ms grande que el tamao de una goma de lpiz.   Mantngase al da con las vacunas.  Document Released: 10/06/2011 [employer] Patient Information 2012 [employer]. 

## 2012-06-05 LAB — URINALYSIS W MICROSCOPIC + REFLEX CULTURE
Bacteria, UA: NONE SEEN
Bilirubin Urine: NEGATIVE
Casts: NONE SEEN
Specific Gravity, Urine: 1.011 (ref 1.005–1.030)
Urobilinogen, UA: 0.2 mg/dL (ref 0.0–1.0)

## 2012-06-05 LAB — CHOLESTEROL, TOTAL: Cholesterol: 141 mg/dL (ref 0–200)

## 2012-06-05 LAB — GLUCOSE, RANDOM: Glucose, Bld: 82 mg/dL (ref 70–99)

## 2012-06-27 ENCOUNTER — Ambulatory Visit (INDEPENDENT_AMBULATORY_CARE_PROVIDER_SITE_OTHER): Payer: Managed Care, Other (non HMO) | Admitting: Gynecology

## 2012-06-27 ENCOUNTER — Encounter: Payer: Self-pay | Admitting: Gynecology

## 2012-06-27 ENCOUNTER — Ambulatory Visit (INDEPENDENT_AMBULATORY_CARE_PROVIDER_SITE_OTHER): Payer: Managed Care, Other (non HMO)

## 2012-06-27 VITALS — BP 118/70

## 2012-06-27 DIAGNOSIS — R309 Painful micturition, unspecified: Secondary | ICD-10-CM

## 2012-06-27 DIAGNOSIS — N23 Unspecified renal colic: Secondary | ICD-10-CM

## 2012-06-27 DIAGNOSIS — N831 Corpus luteum cyst of ovary, unspecified side: Secondary | ICD-10-CM

## 2012-06-27 DIAGNOSIS — N8300 Follicular cyst of ovary, unspecified side: Secondary | ICD-10-CM

## 2012-06-27 DIAGNOSIS — N83209 Unspecified ovarian cyst, unspecified side: Secondary | ICD-10-CM

## 2012-06-27 NOTE — Progress Notes (Signed)
41 year old patient who was seen the office on June 14  complaining of low abdominal discomfort. Patient with past history of total abdominal hysterectomy. Patient was then seen the office on August 5 for her annual exam. The ultrasound from back in June demonstrated the following:  Vaginal cuff and right ovary appeared normal. A left ovarian complex thickwalled vascular cyst measuring 19 x 17 x 16 mm was noted. No free fluid in the cul-de-sac  Patient received a dose of Depo-Provera 150 mg IM and return to the office today for followup ultrasound with the following findings:  Absent uterus, right ovary left ovary normal. Previous cyst of the left ovary not seen. Patient's recent lab work were discussed with the patient to include the following: CBC, TSH, blood sugar, urinalysis were all normal. She scheduled for mammogram next year. We'll see her next year or when necessary.

## 2012-11-12 ENCOUNTER — Encounter: Payer: Self-pay | Admitting: Family Medicine

## 2012-11-12 ENCOUNTER — Telehealth: Payer: Self-pay | Admitting: Internal Medicine

## 2012-11-12 ENCOUNTER — Ambulatory Visit (INDEPENDENT_AMBULATORY_CARE_PROVIDER_SITE_OTHER): Payer: Managed Care, Other (non HMO) | Admitting: Family Medicine

## 2012-11-12 VITALS — BP 110/80 | HR 85 | Temp 98.4°F | Ht 59.5 in | Wt 146.0 lb

## 2012-11-12 DIAGNOSIS — L509 Urticaria, unspecified: Secondary | ICD-10-CM

## 2012-11-12 MED ORDER — EPINEPHRINE 0.3 MG/0.3ML IJ DEVI: 0.3000 mg | Freq: Once | INTRAMUSCULAR | Status: DC

## 2012-11-12 NOTE — Progress Notes (Signed)
  Subjective:    Patient ID: Rachel Ochoa, female    DOB: [birth year], 42 y.o.   MRN: [id number]  HPI Rash- 1st noticed last week when she was having itching on her elbows bilaterally.  After scratching, she developed 'bumps' at the site.  sxs resolved.  Then a few days later she had itching of legs.  Pt reports itching and redness will spread rapidly and she develops large hives 'all over'.  This morning after the gym and shower she had widespread hives (took pictures).  Improved w/ benadryl.  No relation to heat exposure such as gym or shower.  Denies any changes to diet.  No new medications.  Has lupus.  Will have itching of hands/feet.  No one else at home has similar itching (5 people in house).  Pt reports that over the weekend she had swelling of lips.  Tongue was not involved and no difficulty swallowing.   Review of Systems For ROS see HPI     Objective:   Physical Exam  Vitals reviewed. Constitutional: She appears well-developed and well-nourished. No distress.  HENT:  Head: Normocephalic and atraumatic.  Nose: Nose normal.  Mouth/Throat: Oropharynx is clear and moist. No oropharyngeal exudate.  Skin: Skin is warm and dry. No rash (pictures pt took from earlier show diffuse urticaria) noted. No erythema.          Assessment & Plan:

## 2012-11-12 NOTE — Telephone Encounter (Signed)
Patient Information:  Caller Name: Retha  Phone: [phone]  Patient: Rachel, Ochoa  Gender: Female  DOB: Feb 28, 1971  Age: 41 Years  PCP: Willow Ora  Pregnant: No  Office Follow Up:  Does the office need to follow up with this patient?: No  Instructions For The Office: N/A   Symptoms  Reason For Call & Symptoms: itching for a couple of days; pt reports today (11/12/12) body became really hot and rash spreading rapidly  Reviewed Health History In EMR: Yes  Reviewed Medications In EMR: Yes  Reviewed Allergies In EMR: Yes  Reviewed Surgeries / Procedures: Yes  Date of Onset of Symptoms: 11/10/2012  Treatments Tried: benadryl  Treatments Tried Worked: No OB / GYN:  LMP: Unknown  Guideline(s) Used:  Rash or Redness - Widespread  Disposition Per Guideline:   See Today in Office  Reason For Disposition Reached:   Patient wants to be seen  Advice Given:  Oral Antihistamine Medication for Itching:  Take an antihistamine like diphenhydramine (Benadryl) for widespread rashes that itch. The adult dosage of Benadryl is 25-50 mg by mouth 4 times daily.  Call Back If:   You become worse  Appointment Scheduled:  11/12/2012 13:45:00 Appointment Scheduled Provider:  Sheliah Hatch.  (no appt available with Dr Drue Novel)

## 2012-11-12 NOTE — Telephone Encounter (Signed)
Has an appointment to see Dr Beverely Low

## 2012-11-12 NOTE — Patient Instructions (Addendum)
We'll notify you of your allergy appt Continue benadryl if hives develop If you have lip or tongue swelling, or trouble breathing- use the epipen Call with any questions or concerns Hang in there!!!

## 2012-11-13 ENCOUNTER — Encounter: Payer: Self-pay | Admitting: Family Medicine

## 2012-11-13 NOTE — Assessment & Plan Note (Signed)
New.  Cause unknown.  Pt denies new meds, change in diet.  No direct correlation to heat/cold exposure.  Refer to allergy urgently as each occurrence seems to worsen and she had lip swelling over the weekend.  Continue benadryl as needed for hives but hold daily antihistamine pending allergy work up.  epipen given for use if pt has difficulty breathing.  Pt expressed understanding and is in agreement w/ plan.

## 2012-12-15 ENCOUNTER — Other Ambulatory Visit: Payer: Self-pay

## 2013-01-26 IMAGING — CR DG CHEST 2V
2 series · 2 of 2 positions shown · non-contrast
Comparison: 06/10/2011

CLINICAL DATA: Body aches.  Fever.  Left year and jaw pressure.

CHEST - 2 VIEW

[view not recorded (1 of 2)]
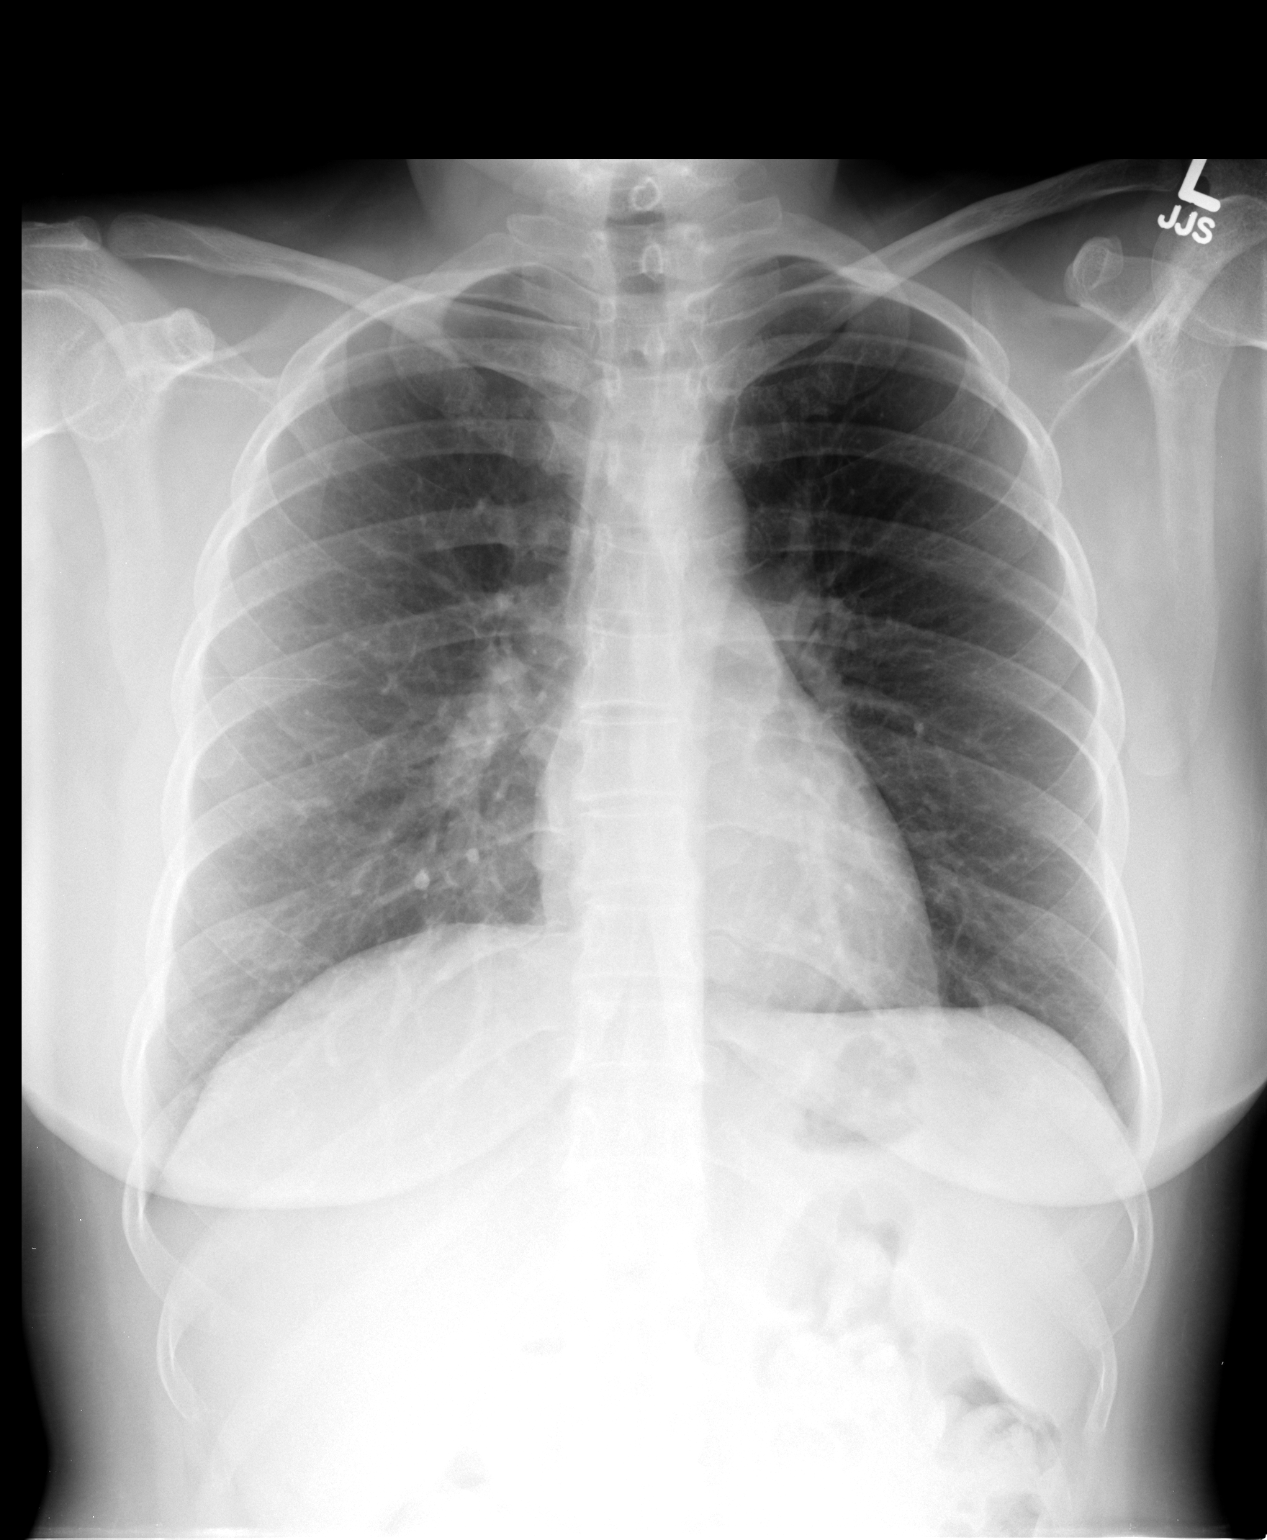

[view not recorded (2 of 2)]
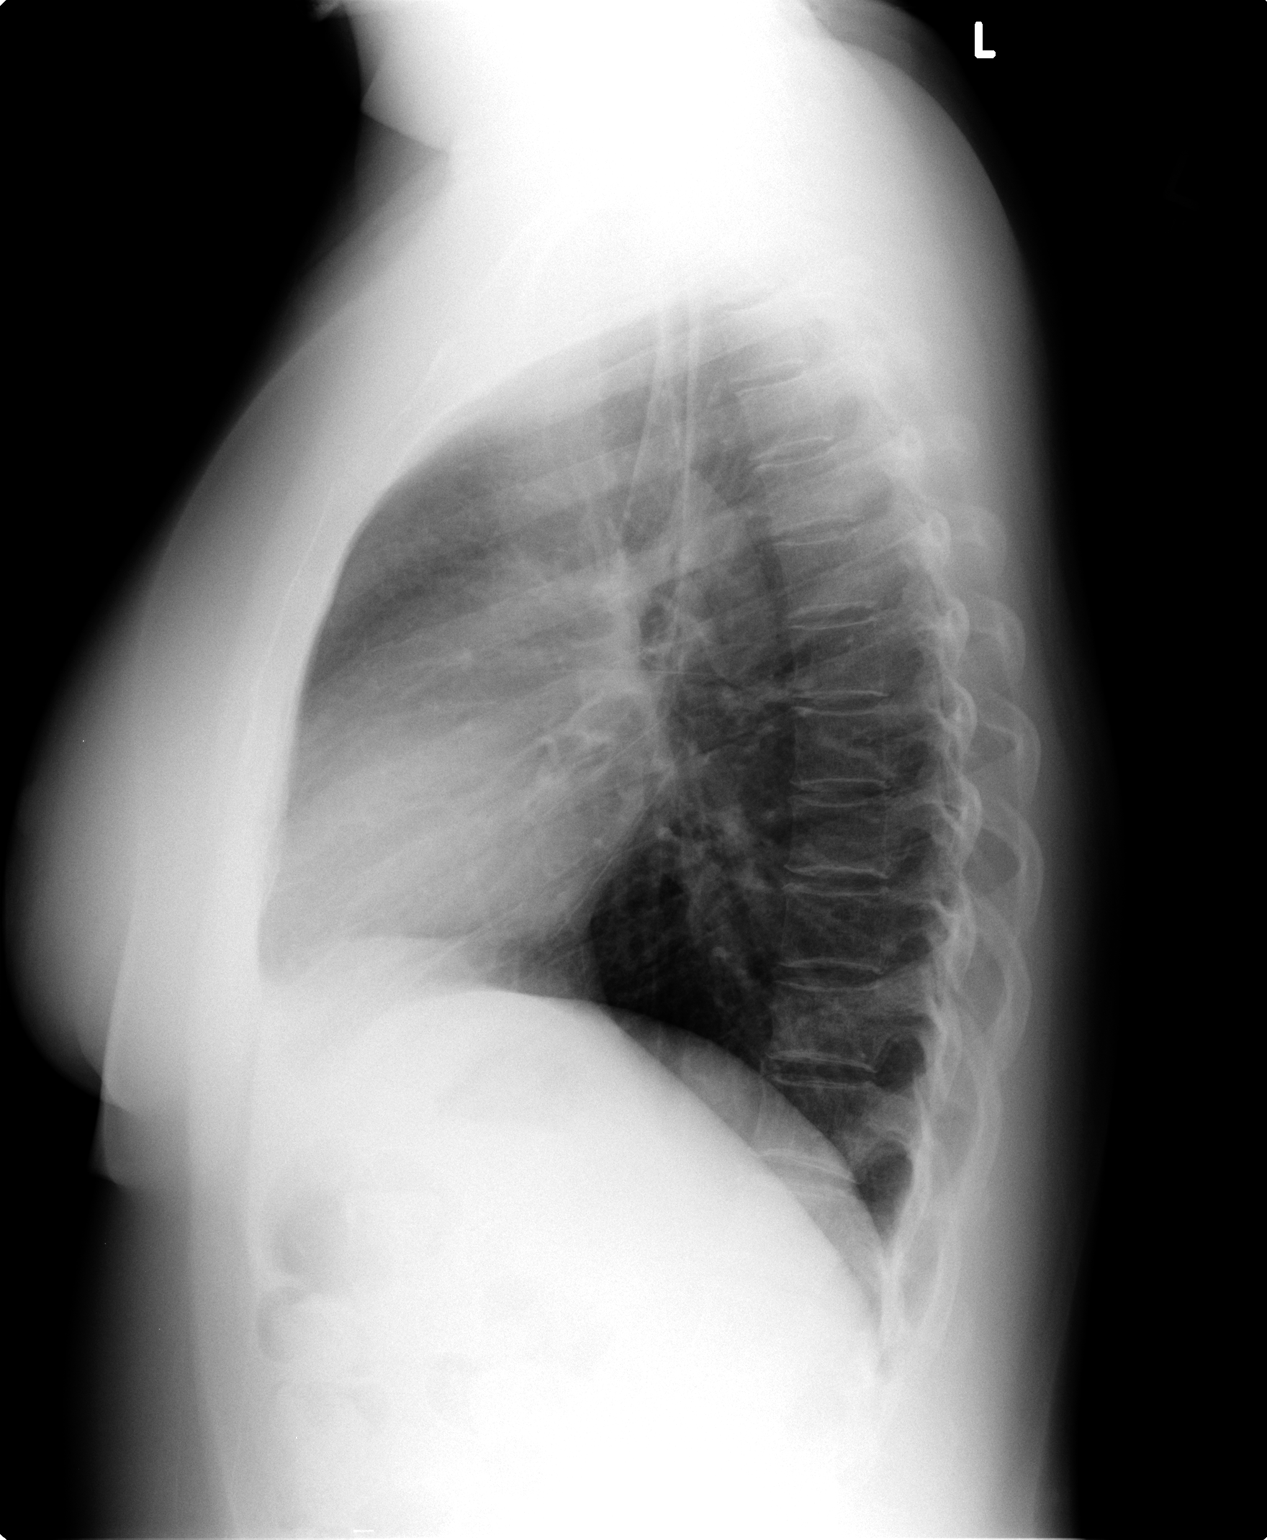

[2 of 2 positions shown; findings below may reference images not displayed]

FINDINGS: Cardiac and mediastinal contours appear normal.

The lungs appear clear.

No pleural effusion is identified.
IMPRESSION: No significant abnormality identified.

## 2013-03-12 ENCOUNTER — Ambulatory Visit (INDEPENDENT_AMBULATORY_CARE_PROVIDER_SITE_OTHER): Payer: Managed Care, Other (non HMO) | Admitting: Internal Medicine

## 2013-03-12 VITALS — BP 112/74 | HR 92 | Temp 98.4°F | Wt 141.0 lb

## 2013-03-12 DIAGNOSIS — R42 Dizziness and giddiness: Secondary | ICD-10-CM

## 2013-03-12 DIAGNOSIS — R0602 Shortness of breath: Secondary | ICD-10-CM

## 2013-03-12 DIAGNOSIS — M329 Systemic lupus erythematosus, unspecified: Secondary | ICD-10-CM

## 2013-03-12 DIAGNOSIS — G459 Transient cerebral ischemic attack, unspecified: Secondary | ICD-10-CM

## 2013-03-12 DIAGNOSIS — H9209 Otalgia, unspecified ear: Secondary | ICD-10-CM

## 2013-03-12 NOTE — Patient Instructions (Addendum)
If you have more difficulty with your speech let me know, if symptoms severe go to the ER    Ataque isqumico transitorio (Transient Ischemic Attack)  Un ataque isqumico transitorio (AIT) es un "ictus de advertencia" que causa sntomas similares al ictus. A diferencia del ictus, un accidente isqumico transitorio (AIT) no causa un dao permanente en el cerebro. Los sntomas pueden suceder muy rpidamente y no duran Merchandiser, retail. Es Secretary/administrator los sntomas del AIT y Audiological scientist. Esto puede ayudar a evitar un ictus ms grave, o la muerte.  CAUSAS  La causa es una obstruccin temporaria de una arteria en el cerebro o en el cuello (arteria cartida). La obstruccin no permite que llegue el flujo de sangre que el cerebro necesita, y puede causar diferentes sntomas. La causa de la obstruccin puede ser:  Un cogulo sanguneo.  Acumulacin de grasa (placa) en una arteria del cuello o el cerebro. SNTOMAS Los sntomas son los [employer] del ictus, pero son temporarios. Los sntomas que aparecen de forma sbita son:  Debilidad o adormecimiento en un lado del cuerpo. Especialmente en:  El rostro.  Los brazos.  Las piernas.  Dificultad para hablar, pensar o confusin.  Modificaciones en la visin, como dificultad para ver con uno o ambos ojos.  Mareo, falta de equilibrio o problemas para caminar.  Dolor de cabeza intenso. CUALQUIERA DE ESTOS SNTOMAS PUEDE REPRESENTAR UN PROBLEMA GRAVE QUE ES UNA EMERGENCIA. No espere a que los sntomas desaparezcan. Pida ayuda mdica de inmediato. Llame al Clarita Leber de emergencias (911 en los [place]). NO CONDUZCA hasta el hospital por sus propios medios. FACTORES DE RIESGO Hay factores que aumentan el riesgo de sufrir un ataque isqumico transitorio: Ellos pueden ser:   Presin arterial elevada (hipertensin).  Colesterol elevado (hiperlipidemia).  Enfermedades cardacas (aterosclerosis).  El hbito de  fumar.  Diabetes.  Ritmo cardaco anormal (fibrilacin auricular).  Historia familiar de ictus y ataque cardaco.  Uso de anticonceptivos orales (especialmente cuando se combina con el hbito de fumar). DIAGNSTICO  Un ataque isqumico transitorio puede diagnosticarse basndose en:  Sntomas.  Historia clnica.  Factores de [place].  Las pruebas que pueden ayudar al diagnstico de los sntomas son:  Rosana Fret computada o Health visitor. Estas pruebas pueden proporcionar imgenes detalladas del cerebro.  Ecografa de la cartida. Estas pruebas buscan obstrucciones en las arterias cartidas del cuello.  Arteriografa. Se inserta en la ingle un tubo delgado pequeo y flexible (catter) a travs de un pequeo corte (incisin). Se dirige el catter [employer] cartida o la arteria vertebral. Luego se inyecta una sustancia de Engineer, structural. Esta sustancia hace contrastar la arteria y permite al mdico observar los estrechamientos u obstrucciones que pueden causar un ataque isqumico transitorio. TRATAMIENTO Segn la causa del trastorno, las opciones pueden variar. El tratamiento es importante para prevenir un ictus. Las opciones de tratamiento son:  Medicamentos Pueden ser:  Medicamentos que [employer].  Medicamentos antiplaquetas.  Medicamentos para la presin arterial.  Medicamentos anticoagulantes.  Ciruga:  Endarterectoma carotdea Las arterias cartidas son las que suministran la sangre oxigenada a la cabeza y al cuello. Esta ciruga puede ayudar a remover los depsitos de grasa (placas) de las arterias cartidas.  Angioplastia y colocacin de un stent. En esta ciruga se utiliza un baln para dilatar una arteria obstruida en el cerebro. Un stent es un tubo pequeo de metal que sirve para mantener la arteria [place]. INSTRUCCIONES PARA EL CUIDADO DOMICILIARIO  Es importante tomar [employer]  medicamentos segn las indicaciones del mdico. Si el  medicamento tiene efectos colaterales que lo afectan de [place] negativa, comunquese inmediatamente con su mdico. No suspenda los medicamentos a menos que se lo haya indicado el mdico. Puede ser necesario que le cambien algunos medicamentos para un mejor tratamiento.  No fume. Comunquese con su mdico acerca de como dejar de fumar.  Consuma una dieta rica en frutas, vegetales y carnes magras. Evite la dieta rica en grasas y con mucha sal. Un nutricionista podr ayudarlo a hacer elecciones saludables.  Mantenga un peso saludable. Realice un plan de ejercicios aprobado por su mdico. SOLICITE ATENCIN MDICA DE INMEDIATO SI:  Siente debilidad o adormecimiento en alguna parte del cuerpo.  Tiene dificultad para pensar, hablar o se siente confuso.  La visin se modifica.  Tiene dificultad para caminar o mantener el equilibrio.  Sufre una cefalea grave. ASEGRESE QUE:   Comprende estas instrucciones.  Controlar su enfermedad.  Solicitar ayuda inmediatamente si no mejora o si empeora. Document Released: 07/27/2005 Document Revised: 01/09/2012 [facility] Patient Information 2013 [place], [place].

## 2013-03-12 NOTE — Progress Notes (Signed)
  Subjective:    Patient ID: Rachel Ochoa, female    DOB: 1970/12/08, 42 y.o.   MRN: [id number]  HPI Acute visit, last visit here 11-2011, at the time she was diagnosed with lupus. She has a number of complaints, her history is confusing, to the best of my ability she describes the symptoms as follows: --Ongoing problems with a left ear, it feels full,   at times feels hot. Some decrease hearing?Marland Kitchen No ear discharge. --Food allergies? After eating she feels weak, palpitations, the left ear discomfort is worse, feels "weird under the skin". Apparently, worse when she eats gluten rich foods.No actual rash noted. --Dizziness on and off, this is going on for a while, worse when she moves her head or stands up. Not associated with nausea or vomiting. --2 weeks ago, she had 2 brief episodes where her speech wasn't clear, symptoms lasted few seconds. At that time did not have diplopia, facial paresthesias or motor deficits. --Occasionally difficulty breathing, her rheumatology order a echocardiogram but she has not pursued it mostly due to cost. Denies chest pain, occasional palpitations. No fever, chills, cough,wheezing.  Past Medical History  Diagnosis Date  . Epilepsy     better by age 21  . Elevated BP     without diagnosis of hypertension 2/11  . Dyslipidemia   . Abdominal pain     chronic sees GI IBS  . Leukocytopenia     thromboycytopenia, unspecified- sees hematology, rx oberservation  . IBS (irritable bowel syndrome)   . Migraine aura, persistent     saw neuro 12/10  . NSVD (normal spontaneous vaginal delivery)     X3  . Lupus (systemic lupus erythematosus) 11-2011   Past Surgical History  Procedure Laterality Date  . Cholecystectomy    . Dilation and curettage of uterus    . Tonsillectomy    . Breast surgery      BREAST REDUCTION  . Abdominal hysterectomy      TAH Only     Review of Systems See history of present illness    Objective:   Physical Exam BP 112/74  Pulse  92  Temp(Src) 98.4 F (36.9 C) (Oral)  Wt 141 lb (63.957 kg)  BMI 28.01 kg/m2  SpO2 95%  LMP 03/01/2004  General -- alert, well-developed, No apparent distress Neck --no thyromegaly , normal carotid pulses HEENT -- TMs normal, throat w/o redness, face symmetric and not tender to palpation, EOMI, PERRLA. Not pale or jaundice  Lungs -- normal respiratory effort, no intercostal retractions, no accessory muscle use, and normal breath sounds.   Heart-- normal rate, regular rhythm, no murmur, and no gallop.   Extremities-- no pretibial edema bilaterally  Neurologic-- alert & oriented X3 ; Gait normal, speech fluent, memory normal, DTRs and strength normal in all extremities. Psych-- Cognition and judgment appear intact. Alert and cooperative with normal attention span and concentration.  Slightly anxious appearing      Assessment & Plan:  Here with multiple issues:  Food allergies? Recommend to see Dr. [place] Callas.

## 2013-03-13 ENCOUNTER — Encounter: Payer: Self-pay | Admitting: Internal Medicine

## 2013-03-13 DIAGNOSIS — R0602 Shortness of breath: Secondary | ICD-10-CM | POA: Insufficient documentation

## 2013-03-13 DIAGNOSIS — G459 Transient cerebral ischemic attack, unspecified: Secondary | ICD-10-CM | POA: Insufficient documentation

## 2013-03-13 NOTE — Assessment & Plan Note (Signed)
Shortness of breath--Echocardiogram ordered by rheumatology, patient reluctant to pursue. EKG today nsr rec to f/u w/ the ECHO Labs are routinely checked at rheumatology because she is on Plaquenil.

## 2013-03-13 NOTE — Assessment & Plan Note (Signed)
Dx w/ lupus 11-2011

## 2013-03-13 NOTE — Assessment & Plan Note (Signed)
On going issue, rec observation

## 2013-03-13 NOTE — Assessment & Plan Note (Signed)
  TIA? See history of present illness, she had 2 episodes of difficulty with her speech. There is no carotid disease on clinical grounds. She has lupus, at increased risk of stroke . Recommend aspirin daily. Plan--MRI of the brain.

## 2013-03-13 NOTE — Assessment & Plan Note (Signed)
Left ear discomfort, she saw ENT 05-2011, with similar complaints, otosclerosis? Was recommended to followup with Dr. Pollyann Kennedy. Referral done.

## 2013-03-17 ENCOUNTER — Ambulatory Visit
Admission: RE | Admit: 2013-03-17 | Discharge: 2013-03-17 | Disposition: A | Discharge status: Home / Self Care | Payer: Managed Care, Other (non HMO) | Source: Ambulatory Visit | Attending: Internal Medicine | Admitting: Internal Medicine

## 2013-03-17 DIAGNOSIS — G459 Transient cerebral ischemic attack, unspecified: Secondary | ICD-10-CM

## 2013-05-13 ENCOUNTER — Telehealth: Payer: Self-pay | Admitting: Rheumatology

## 2013-05-13 NOTE — Telephone Encounter (Signed)
Letter sent to ordering physician regarding appt not being scheduled.

## 2013-06-06 ENCOUNTER — Ambulatory Visit (INDEPENDENT_AMBULATORY_CARE_PROVIDER_SITE_OTHER): Payer: Managed Care, Other (non HMO) | Admitting: Gynecology

## 2013-06-06 ENCOUNTER — Encounter: Payer: Self-pay | Admitting: Gynecology

## 2013-06-06 VITALS — BP 126/78 | Ht 59.0 in | Wt 141.0 lb

## 2013-06-06 DIAGNOSIS — IMO0002 Reserved for concepts with insufficient information to code with codable children: Secondary | ICD-10-CM

## 2013-06-06 DIAGNOSIS — Z01419 Encounter for gynecological examination (general) (routine) without abnormal findings: Secondary | ICD-10-CM

## 2013-06-06 DIAGNOSIS — Z23 Encounter for immunization: Secondary | ICD-10-CM

## 2013-06-06 DIAGNOSIS — Z833 Family history of diabetes mellitus: Secondary | ICD-10-CM

## 2013-06-06 DIAGNOSIS — R635 Abnormal weight gain: Secondary | ICD-10-CM

## 2013-06-06 LAB — HEMOGLOBIN A1C: Mean Plasma Glucose: 103 mg/dL (ref <117)

## 2013-06-06 LAB — CBC WITH DIFFERENTIAL/PLATELET
Basophils Absolute: 0 10*3/uL (ref 0.0–0.1)
Basophils Relative: 0 % (ref 0–1)
MCHC: 34.2 g/dL (ref 30.0–36.0)
Monocytes Absolute: 0.3 10*3/uL (ref 0.1–1.0)
Neutro Abs: 3.6 10*3/uL (ref 1.7–7.7)
Neutrophils Relative %: 73 % (ref 43–77)
RDW: 13 % (ref 11.5–15.5)

## 2013-06-06 NOTE — Progress Notes (Signed)
Rachel Ochoa [birth year] [id number]   History:    42 y.o.  for annual gyn exam. Patient is being followed by Dr. Dareen Piano for her lupus. She has informing also that she has gluten allergy. She also has been evaluated because of multiple allergies with her immunologist. She's currently on plaque when L. And prednisone daily. She has a prior history of total abdominal hysterectomy. Patient with no prior history of abnormal Pap smears. Patient with strong family history of diabetes in her father. Patient currently not on any calcium or vitamin D. Patient has not received her Tdap vaccine yet. Patient has gained 3 pounds since last year. Patient with prior history of reduction mammoplasty.  Past medical history,surgical history, family history and social history were all reviewed and documented in the EPIC chart.  Gynecologic History Patient's last menstrual period was 03/01/2004. Contraception: status post hysterectomy Last Pap: 2012. Results were: normal Last mammogram: 2012. Results were: normal  Obstetric History OB History   Grav Para Term Preterm Abortions TAB SAB Ect Mult Living   4 3 3  1  1   3      # Outc Date GA Lbr Len/2nd Wgt Sex Del Anes PTL Lv   1 TRM     F SVD  No Yes   2 TRM     M SVD  No Yes   3 TRM     M SVD  No Yes   4 SAB                ROS: A ROS was performed and pertinent positives and negatives are included in the history.  GENERAL: No fevers or chills. HEENT: No change in vision, no earache, sore throat or sinus congestion. NECK: No pain or stiffness. CARDIOVASCULAR: No chest pain or pressure. No palpitations. PULMONARY: No shortness of breath, cough or wheeze. GASTROINTESTINAL: No abdominal pain, nausea, vomiting or diarrhea, melena or bright red blood per rectum. GENITOURINARY: No urinary frequency, urgency, hesitancy or dysuria. MUSCULOSKELETAL: No joint or muscle pain, no back pain, no recent trauma. DERMATOLOGIC: No rash, no itching, no lesions. ENDOCRINE: No  polyuria, polydipsia, no heat or cold intolerance. No recent change in weight. HEMATOLOGICAL: No anemia or easy bruising or bleeding. NEUROLOGIC: No headache, seizures, numbness, tingling or weakness. PSYCHIATRIC: No depression, no loss of interest in normal activity or change in sleep pattern.     Exam: chaperone present  BP 126/78  Ht 4\' 11"  (1.499 m)  Wt 141 lb (63.957 kg)  BMI 28.46 kg/m2  LMP 03/01/2004  Body mass index is 28.46 kg/(m^2).  General appearance : Well developed well nourished female. No acute distress HEENT: Neck supple, trachea midline, no carotid bruits, no thyroidmegaly Lungs: Clear to auscultation, no rhonchi or wheezes, or rib retractions  Heart: Regular rate and rhythm, no murmurs or gallops Breast:Examined in sitting and supine position were symmetrical in appearance, no palpable masses or tenderness,  no skin retraction, no nipple inversion, no nipple discharge, no skin discoloration, no axillary or supraclavicular lymphadenopathy Abdomen: no palpable masses or tenderness, no rebound or guarding Extremities: no edema or skin discoloration or tenderness  Pelvic:  Bartholin, Urethra, Skene Glands: Within normal limits             Vagina: No gross lesions or discharge  Cervix: Absent  Uterus: Absent  Adnexa  Without masses or tenderness  Anus and perineum  normal   Rectovaginal  normal sphincter tone without palpated masses or tenderness  Hemoccult None indicated     Assessment/Plan:  42 y.o. female for annual exam  Who is on prednisone 5 mg daily for her lupus. I recommended that she have a baseline bone density study that she will schedule here in our office in the next few weeks. We discussed the importance of calcium and vitamin D for osteoporosis prevention. Patient was counseled and received that Tdap vaccine today. The following labs were ordered: CBC, hemoglobin A1c, screen cholesterol, TSH and urinalysis. Patient will also be scheduled for  mammogram which is due. No Pap smear done today the new guidelines were discussed. Patient was referred to dermatologist per her request.    [name], 11:28 AM 06/06/2013

## 2013-06-06 NOTE — Patient Instructions (Addendum)
Vacuna difteria, tétanos, tos ferina (DTP) - Lo que debe saber   (Tetanus, Diphtheria, Pertussis [Tdap] Vaccine, What You Need to Know)  ¿PORQUÉ VACUNARSE?   El tétanos, la difteria y la tos ferina pueden ser enfermedades muy graves, aún en adolescentes y adultos. La vacuna Tdap nos puede proteger de estas enfermedades.   El TÉTANOS (Trismo) provoca la contracción dolorosa de los músculos, por lo general, en todo el cuerpo.   · Puede causar el endurecimiento de los músculos de la cabeza y el cuello, de modo que impide abrir la boca, tragar y en algunos casos, respirar. El tétanos causa la muerte de 1 de cada 5 personas que se infectan.  La DIFTERIA produce la formación de una membrana gruesa que cubre el fondo de la garganta.   · Puede causar problemas respiratorios, parálisis, insuficiencia cardíaca e incluso la muerte.  TOS FERINA (Pertusis) causa episodios de tos graves, que pueden hacer difícil la respiración, causar vómitos y trastornos del sueño.   · También puede ser la causa de pérdida de peso, incontinencia y fractura de costillas. Dos de cada 100 adolescentes y cinco de cada 100 adultos que enferman de pertusis deben ser hospitalizados, tienen complicaciones como la neumonía o mueren.  Estas enfermedades son provocadas por bacterias. La difteria y el pertusis se contagian de persona a persona a través de la tos o el estornudo. El tétanos ingresa al organismo a través de cortes, rasguños o heridas.   Antes de las vacunas, en los [place] se vieron más de 200.000 casos al año de difteria y tos ferina y cientos de casos de tétanos. Desde el inicio de la vacunación, los casos de tétanos y difteria han disminuido alrededor del 99% y los casos de tos ferina alrededor del 80%.   Tdap   La vacuna Tdap protege a adolescentes y adultos contra el tétanos, la difteria y la tos ferina. Una dosis de Tdap se administra a los 11 o 12 años de edad. Las personas que no recibieron la vacuna Tdap a esa edad deben  recibirla tan pronto como sea posible.   Es muy importante que los profesionales de la salud y todos aquellos que tengan contacto cercano con bebés menores de 12 meses reciban la Tdap.   Las mujeres embarazadas deben recibir una dosis de Tdap en cada embarazo, para proteger al recién nacido de la tos ferina. Los niños tienen mayor riesgo de complicaciones graves y potencialmente mortales debido a la tos ferina.   Una vacuna similar, llamada Td, protege contra el tétanos y la difteria, pero no contra la tos ferina. Cada 10 años debe recibirse un refuerzo de Td. La Tdap se puede administrar como uno de estos refuerzos, si todavía no ha recibido una dosis. También se puede aplicar después de un corte o quemadura grave para prevenir la infección por tétanos.   El médico le dará más información.   La Tdap puede administrarse de manera segura simultáneamente con otras vacunas.   ALGUNAS PERSONAS NO DEBEN RECIBIR ESTA VACUNA.   · Si alguna vez tuvo una reacción alérgica potencialmente mortal después de una dosis de la vacuna contra el tétanos, la diferia o la tos ferina, o tuvo una alergia grave a cualquiera de los componentes de esta vacuna, no debe aplicarse la vacuna. Informe a su médico si usted sufre algún tipo de alergia grave.  · Si estuvo en coma o sufrió múltiples convulsiones dentro de los 7 días posteriores después de una dosis de DTP o DTaP   su mdico si:  tiene epilepsia u otra enfermedad del sistema nervioso,  siente dolor intenso o se hincha despus de recibir cualquier vacuna contra la difteria, el ttanos o la tos ferina,  alguna vez ha sufrido el sndrome de Guillain-Barr,  no se siente bien el da en que se ha programado la vacuna. RIESGOS DE UNA REACCIN A LA VACUNA Con cualquier medicamento, incluyendo las vacunas, existe la posibilidad de que aparezcan efectos secundarios. Estos son  leves y desaparecen por s solos, pero tambin son posibles las reacciones graves.  Breves episodios de desmayo pueden seguir a una vacunacin, causando lesiones por la cada. Sentarse o recostarse durante 15 minutos puede ayudar a evitarlo. Informe al mdico si se siente mareado o aturdido, tiene cambios en la visin o zumbidos en los odos.  Problemas leves luego de la Tdap (no interferirn con las actividades)   Dolor en el sitio de la inyeccin (alrededor de 1 de cada 4 adolescentes o 2 de cada 3 adultos).  Enrojecimiento o hinchazn en el lugar de la inyeccin (1 de cada 5 personas).  Fiebre leve de al menos 100,4 F (38 C) (hasta alrededor de 1 cada 25 adolescentes y 1 de cada 100 adultos).  Dolor de cabeza (3 o 4de cada 10 personas).  Cansancio (1 de cada 3 o 4 personas).  Nuseas, vmitos, diarrea, dolor de estmago (1 de cada 4 adolescentes o 1 de cada 10 adultos).  Escalofros, dolores corporales, dolor articular, erupciones, inflamacin de las glndulas (poco frecuente). Problemas moderados: (interfieren con las actividades, pero no requieren atencin mdica)   Dolor en el lugar de la inyeccin (1 de cada 5 adolescentes o 1 de cada 100 adultos).  Enrojecimiento o inflamacin (1 de cada 16 adolescentes y 1 de cada 25 adultos).  Fiebre de ms de 102F o 38,9C (1 de cada 100 adolescentes o 1 de cada 250 adultos).  Dolor de cabeza (alrededor de 4 de cada 20 adolescentes y 3 de cada 10 adultos).  Nuseas, vmitos, diarrea, dolor de estmago (1 a 3 de cada 100 personas).  Hinchazn de todo el brazo en el que se aplic la vacuna (3 de cada100 personas). Problemas graves: luego de la Tdap (no puede realizar las actividades habituales, requiere atencin mdica)   Inflamacin, dolor intenso, sangrado y enrojecimiento en el brazo, en el sitio de la inyeccin (poco frecuente). Una reaccin alrgica grave puede ocurrir despus de la administracin de cualquier vacuna (se estima en  menos de 1 en un milln de dosis).  QU PASA SI HAY UNA REACCIN GRAVE?  Qu signos debo buscar?  Observe todo lo que le preocupe, como signos de una reaccin alrgica grave, fiebre muy alta o cambios en el comportamiento. Los signos de una reaccin alrgica grave pueden incluir urticaria, hinchazn de la cara y la garganta, dificultad para respirar, ritmo cardaco acelerado, mareos y debilidad. Estos sntomas pueden comenzar entre unos pocos minutos y algunas horas despus de la vacunacin.  Qu debo hacer?  Si usted piensa que se trata de una reaccin alrgica grave o de otra emergencia que no puede esperar, llame al 911 o lleve a la persona al hospital ms cercano. De lo contrario, llame a su mdico.  Despus, la reaccin debe informarse a la "Vaccine Adverse Event Reporting System" (Sistema de informacin sobre efectos adversos de las vacunas -VAERS). El mdico o usted mismo pueden realizar el informe en el sitio web del VAERS www.vaers.hhs.govo llame al 1-800-822-7967. El VAERS es slo para informar reacciones. No   brindan consejo mdico.  PROGRAMA NACIONAL DE COMPENSACIN DE DAOS POR VACUNAS  El National Vaccine Injury [employer] (VICP) es un programa federal que fue creado para compensar a las personas que puedan haber sufrido daos al recibir ciertas vacunas.  Aquellas personas que consideren que han sufrido un dao como consecuencia de una vacuna y quieren saber ms acerca del programa y como presentar Roslynn Amble, pueden llamar [phone] o visite el sitio web del VICP en SpiritualWord.[email or web address].  CMO PUEDO OBTENER MS INFORMACIN?   Consulte a su mdico.  Comunquese con el servicio de salud de su localidad o [address].  Comunquese con los Centros para el control y la prevencin de Child psychotherapist for Disease Control and Prevention , CDC).  llamando al [phone] o visitando el sitio web del CDC en PicCapture.uy. CDC Tdap Vaccine VIS  (03/08/12)  Document Released: 10/03/2012 [facility] Patient Information 2014 [place], [place]. Densitometra sea  (Bone Densitometry) La densitometra sea es una radiografa especial que mide la densidad de los huesos y se [place] para predecir el riesgo de fracturas seas. Esta estudio se [place] para Warehouse manager contenido mineral y la densidad de los huesos para diagnosticar osteoporosis. La osteoporosis es la prdida de tejido seo que hace que el hueso se debilite. Generalmente ocurre en las mujeres que entran en la menopausia. Pero tambin pueden sufrirla los hombres y personas con otras enfermedades.  PREPARACIN PARA LA PRUEBA  No es necesaria la preparacin.  Geronimo Running EXAMINARSE?   Todas las [employer] de 65 aos.  Las mujeres posmenopusicas (50 a 65 aos) con factores de riesgo para osteoporosis.  Las personas que han sufrido fracturas previas realizando actividades normales.  Las personas de contextura corporal delgada (menos de 127 libras [63.5 kg] o con un ndice de masa corporal [IMC] de menos de 21).  Las personas que tengan un padre que haya sufrido una fractura de cadera o que tengan antecedentes de osteoporosis.  Los fumadores.  Las personas que sufren artritis reumatoidea.  Los que consumen alcohol en exceso (ms de 3 medidas la mayor parte de [place]).  Las mujeres con menopausia temprana. CUNDO DEBE REALIZAR UN NUEVO ESTUDIO?  Las guas actuales sugieren que se debe esperar por lo menos 2 aos antes de repetir una prueba de densidad sea, si la primera fue normal. Algunos estudios recientes indican que las mujeres con densidad sea normal pueden esperar unos aos antes de repetir un estudio de densitometra sea. Comente estos temas con su mdico.  HALLAZGOS NORMALES:   Normal: menos de una desviacin estndar por debajo de lo normal (superior a -1).  Osteopenia:  1 a 2,5 desviaciones estndar por debajo de lo normal (-1 a -2,5).  Osteoporosis:  ms de 2,5 desviaciones estndar por debajo de lo normal (menos de -2,5). Los [employer] se informan como una "puntuacin T" y Neomia Dear "puntuacin Z". La puntuacin T es el nmero que compara la densidad sea con la densidad sea de las mujeres jvenes y sanas. La puntuacin Z es un nmero que compara la densidad sea con las puntuaciones de mujeres de la misma [place], gnero y Armed forces training and education officer.  Los rangos para los resultados normales pueden variar entre diferentes laboratorios y hospitales. Consulte siempre con su mdico despus de Production assistant, radio estudio para Metallurgist significado de los [place] y si los valores se consideran "dentro de los lmites normales".  SIGNIFICADO DEL ESTUDIO  El mdico leer los resultados y Agricultural engineer con usted la importancia y el significado de [place], as [place]  las opciones de tratamiento y la necesidad de pruebas adicionales, si fuera necesario.  OBTENCIN DE LOS RESULTADOS DE LAS PRUEBAS  Es su responsabilidad retirar el resultado del [place]. Consulte en el laboratorio cuando y cmo podr [employer].  Document Released: 07/11/2012 [facility] Patient Information 2014 [place], [place].

## 2013-06-07 LAB — URINALYSIS W MICROSCOPIC + REFLEX CULTURE
Bilirubin Urine: NEGATIVE
Casts: NONE SEEN
Crystals: NONE SEEN
Glucose, UA: NEGATIVE mg/dL
Protein, ur: NEGATIVE mg/dL
Squamous Epithelial / LPF: NONE SEEN
pH: 7.5 (ref 5.0–8.0)

## 2013-09-05 ENCOUNTER — Other Ambulatory Visit: Payer: Self-pay

## 2013-10-08 ENCOUNTER — Encounter: Payer: Self-pay | Admitting: Gynecology

## 2013-10-08 ENCOUNTER — Ambulatory Visit (INDEPENDENT_AMBULATORY_CARE_PROVIDER_SITE_OTHER): Payer: Managed Care, Other (non HMO) | Admitting: Gynecology

## 2013-10-08 VITALS — BP 128/76

## 2013-10-08 DIAGNOSIS — N949 Unspecified condition associated with female genital organs and menstrual cycle: Secondary | ICD-10-CM

## 2013-10-08 DIAGNOSIS — R109 Unspecified abdominal pain: Secondary | ICD-10-CM

## 2013-10-08 DIAGNOSIS — R102 Pelvic and perineal pain: Secondary | ICD-10-CM

## 2013-10-08 DIAGNOSIS — M329 Systemic lupus erythematosus, unspecified: Secondary | ICD-10-CM

## 2013-10-08 DIAGNOSIS — R3915 Urgency of urination: Secondary | ICD-10-CM

## 2013-10-08 LAB — URINALYSIS W MICROSCOPIC + REFLEX CULTURE
Bilirubin Urine: NEGATIVE
Hgb urine dipstick: NEGATIVE
Ketones, ur: NEGATIVE mg/dL
Protein, ur: NEGATIVE mg/dL
Urobilinogen, UA: 0.2 mg/dL (ref 0.0–1.0)

## 2013-10-08 NOTE — Patient Instructions (Signed)
Lupus (Lupus) El lupus (tambin denominado lupus eritematoso sistmico) es una enfermedad del sistema de defensas naturales del organismo (sistema inmunolgico). En esta enfermedad, el sistema inmunolgico ataca diferentes reas del cuerpo (enfermedad autoinmune). CAUSAS La causa es desconocida. Sin embargo, el lupus puede aparecer en miembros de una misma familia. Ciertos genes pueden favorecer la aparicin de esta enfermedad. Es un poco ms comn [employer]. Tambin es ms frecuente [employer] de raza afroamericana y los asiticos. Otros factores que tambin influyen son algunos virus (virus Epstein-Barr, EBV),estrs, hormonas, humo de cigarrillo y Print production planner. SNTOMAS Puede afectar diferentes partes del organismo, incluyendo las articulaciones, la piel, los riones, los pulmones, el corazn, el sistema nervioso y los vasos sanguneos. Los signos y sntomas difieren de Ardelia Mems persona a [place]. La enfermedad puede ser leve o poner peligro la vida. Los rasgos caractersticos de esta enfermedad son:  Erupcin en forma de mariposa en el rostro.  Artritis que involucra una o ms articulaciones.  Enfermedades renales.  [place], prdida de [place], prdida del cabello, fatiga.  Mala circulacin en los dedos de las manos y los pies (enfermedad de [place])  Dolor en el pecho al inspirar profundo. Tambin puede haber dolor abdominal.  Erupcin en la piel en zonas expuestas al sol.  Llagas en la boca y la [place]. DIAGNSTICO El diagnstico puede demorar mucho tiempo y con frecuencia es difcil. Es muy importante un registro exacto de los sntomas y de los problemas de [place]. Los anlisis de sangre son necesarios, aunque una nica prueba no puede confirmar la presencia de lupus. La [employer] de las personas que sufren lupus son positivos a los anticuerpos antinucleares en el anlisis de [place]. Anlisis adicionales de [place] y de [place] y en algunos casos la toma de [place] de tejido del  rin o de la piel pueden ayudar a Firefighter o descartar lupus. TRATAMIENTO No hay cura para el lupus. El Administrator, sports un plan de tratamiento basado en la edad, sexo, estado de [place], sntomas y [place]. Los objetivos son evitar brotes, tratarlos cuando aparezcan, minimizar el dao a los rganos y las complicaciones. El modo en que la enfermedad puede afectar a cada persona vara considerablemente. La [employer] de las personas que sufren lupus puede vivir una vida normal, pero esta enfermedad debe controlarse cuidadosamente. El tratamiento debe ajustarse segn las necesidades para evitar complicaciones. Medicamentos utilizados en el tratamiento:  Los antiinflamatorios no esteroides (AIDE) disminuyen la inflamacin y Printmaker en caso de dolor en el pecho, en las articulaciones y la [place]. Pueden ser ibuprofeno y naproxeno.  Los medicamentos contra la malaria fueron diseados para tratar la malaria. Tambin tratan la fatiga, Conservation officer, historic buildings en las articulaciones, las erupciones de la piel y la inflamacin de los pulmones en pacientes con lupus.  Los corticoides son hormonas poderosas que suprimen rpidamente la inflamacin. Se elegir la dosis ms baja que proporcione el mayor beneficio. Podrn recetarlos en forma de crema, pldoras, inyecciones o por vena (intravenosa).  Los medicamentos inmunosupresores detienen la formacin de clulas inmunes. Pueden utilizarse en casos de enfermedades del rin o nerviosas. INSTRUCCIONES PARA EL CUIDADO DOMICILIARIO  La prctica de ejercicios. Las actividades de bajo impacto ayudan a Theatre manager las articulaciones flexibles sin ser demasiado extenuantes.  Haga reposo luego de la actividad fsica.  Evite la excesiva exposicin al sol.  Siga una nutricin [place] y tome los suplementos que le haya indicado el mdico.  El control del estrs puede beneficiarlo. SOLICITE ATENCIN MDICA SI:  Elenore Rota  la fatiga.  Siente dolor.  Aparece una erupcin  cutnea.  La temperatura oral se eleva sin motivo por arriba de 38,9 C (102 F).  Siente molestias abdominales.  Comienza a Financial risk analyst de cabeza.  Tiene mareos. Irven Shelling MS INFORMACIN The [employer] of Neurological Disorders and Stroke ([employer] de Trastornos y Accidentes Neurolgicos): ToledoAutomobile.co.uk Cel[employer] Corporation of Rheumatology : www.rheumatology.Dana Corporation of Arthritis and Musculoskeletal and Skin Diseases : [employer] para la Artritis y las Enfermedades Musculoesquelticas y Dermatolgicas: www.niams.http://www.myers.net/ Document Released: 01/13/2009 Document Revised: 01/09/2012 [facility] Patient Information 2014 [place], [place].

## 2013-10-08 NOTE — Progress Notes (Signed)
The patient presented to the office today with several complaints. She stated that for the past 2-3 months she's been having left lower quadrant discomfort. She also has had recently left flank pain and some tingling on her left arm and discomfort radiating to her left leg. The patient has history of lupus for which she is currently on plaquinil and daily prednisone and is being followed by Dr. Dareen Piano her rheumatologist which she saw last in September. She has urinary frequency at times but no dysuria and no nocturia. Patient has a prior history of total abdominal hysterectomy and was last seen for her annual gynecological exam on 06/06/2013. Patient is on a gluten free diet because  Gluten allergy. She has been on prednisone 5  For the past 2 years I had recommended that she undergo a bone density study and she has not had that done as of yet.  Exam: What patient describes as back pain is more paraspinous slight tenderness was elicited but not true flank near her kidney. 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 Dareen Piano the next few weeks. We will check her BUN and creatinine because of discomfort near the left kidney region. She is going to schedule an ultrasound here in the office in one to 2 weeks to better assess her left ovary since she was tender and complains of dyspareunia as well as left lower quadrant pain. She will schedule bone density study for the month of January.

## 2013-10-23 ENCOUNTER — Encounter: Payer: Self-pay | Admitting: Gynecology

## 2013-10-23 ENCOUNTER — Ambulatory Visit (INDEPENDENT_AMBULATORY_CARE_PROVIDER_SITE_OTHER): Payer: Managed Care, Other (non HMO)

## 2013-10-23 ENCOUNTER — Ambulatory Visit (INDEPENDENT_AMBULATORY_CARE_PROVIDER_SITE_OTHER): Payer: Managed Care, Other (non HMO) | Admitting: Gynecology

## 2013-10-23 ENCOUNTER — Other Ambulatory Visit: Payer: Self-pay | Admitting: Gynecology

## 2013-10-23 VITALS — BP 118/76

## 2013-10-23 DIAGNOSIS — N839 Noninflammatory disorder of ovary, fallopian tube and broad ligament, unspecified: Secondary | ICD-10-CM

## 2013-10-23 DIAGNOSIS — N831 Corpus luteum cyst of ovary, unspecified side: Secondary | ICD-10-CM

## 2013-10-23 DIAGNOSIS — R1032 Left lower quadrant pain: Secondary | ICD-10-CM

## 2013-10-23 DIAGNOSIS — N949 Unspecified condition associated with female genital organs and menstrual cycle: Secondary | ICD-10-CM

## 2013-10-23 DIAGNOSIS — N83209 Unspecified ovarian cyst, unspecified side: Secondary | ICD-10-CM

## 2013-10-23 DIAGNOSIS — R102 Pelvic and perineal pain: Secondary | ICD-10-CM

## 2013-10-23 DIAGNOSIS — N83202 Unspecified ovarian cyst, left side: Secondary | ICD-10-CM | POA: Insufficient documentation

## 2013-10-23 NOTE — Progress Notes (Signed)
   Presented to the office today to discuss her ultrasound. She was seen in the office on 10/08/2013 for her annual gynecological exam. She was complaining of left lower quadrant discomfort for 2-3 months. She states her symptoms are very minimal now. She had been complaining of left flank discomfort. Recent BUN and creatinine blood test done here in the office was normal. Urinalysis was negative. See to pass office visit for further details after past medical history. Patient prior to that was seen on June 2013 for followup on a small thickwalled vascular cyst measuring 19 x 17 x 16 mm for which patient received Depo-Provera 150 mg IM and a followup ultrasound on August 2013 the cyst had resolved.  Ultrasound today: Patient with prior history transvaginal hysterectomy Right ovary normal. Left ovary with a thick walled cyst measuring 27 x 24 x 20 mm irregular cystic lumen with internal low level echoes and blood flow was noted to the periphery. No fluid in the cul-de-sac.  Assessment/plan: Patient with recurrent ovarian cysts this year a little bit larger than last year which resolved. We will check a CA 125 its limitations were discussed with the patient. We will hold off and not give Depo-Provera. We will rescan her in 6 months which she returns back for her annual exam. This may just be a corpus luteum cyst or hemorrhagic cyst.

## 2013-10-24 LAB — CA 125: CA 125: 9.6 U/mL (ref 0.0–30.2)

## 2013-12-10 ENCOUNTER — Ambulatory Visit (INDEPENDENT_AMBULATORY_CARE_PROVIDER_SITE_OTHER): Payer: Managed Care, Other (non HMO)

## 2013-12-10 ENCOUNTER — Other Ambulatory Visit: Payer: Self-pay | Admitting: Gynecology

## 2013-12-10 DIAGNOSIS — M329 Systemic lupus erythematosus, unspecified: Secondary | ICD-10-CM

## 2013-12-10 DIAGNOSIS — Z13820 Encounter for screening for osteoporosis: Secondary | ICD-10-CM

## 2014-03-31 LAB — HEPATIC FUNCTION PANEL
ALK PHOS: 38 U/L (ref 25–125)
ALT: 13 U/L (ref 7–35)
AST: 17 U/L (ref 13–35)
Bilirubin, Total: 0.3 mg/dL

## 2014-03-31 LAB — CBC AND DIFFERENTIAL
HCT: 39 % (ref 36–46)
Hemoglobin: 12.8 g/dL (ref 12.0–16.0)
PLATELETS: 116 10*3/uL — AB (ref 150–399)
WBC: 5 10^3/mL

## 2014-03-31 LAB — BASIC METABOLIC PANEL
BUN: 14 mg/dL (ref 4–21)
CREATININE: 0.7 mg/dL (ref 0.5–1.1)
Glucose: 84 mg/dL
Potassium: 4.3 mmol/L (ref 3.4–5.3)
SODIUM: 138 mmol/L (ref 137–147)

## 2014-06-09 ENCOUNTER — Other Ambulatory Visit: Payer: Self-pay

## 2014-06-09 DIAGNOSIS — Z1231 Encounter for screening mammogram for malignant neoplasm of breast: Secondary | ICD-10-CM

## 2014-06-13 ENCOUNTER — Ambulatory Visit
Admission: RE | Admit: 2014-06-13 | Discharge: 2014-06-13 | Disposition: A | Discharge status: Home / Self Care | Payer: Managed Care, Other (non HMO) | Source: Ambulatory Visit

## 2014-06-13 DIAGNOSIS — Z1231 Encounter for screening mammogram for malignant neoplasm of breast: Secondary | ICD-10-CM

## 2014-06-24 ENCOUNTER — Other Ambulatory Visit: Payer: Self-pay | Admitting: Gynecology

## 2014-06-24 ENCOUNTER — Ambulatory Visit (INDEPENDENT_AMBULATORY_CARE_PROVIDER_SITE_OTHER): Payer: Managed Care, Other (non HMO) | Admitting: Gynecology

## 2014-06-24 ENCOUNTER — Ambulatory Visit (INDEPENDENT_AMBULATORY_CARE_PROVIDER_SITE_OTHER): Payer: Managed Care, Other (non HMO)

## 2014-06-24 ENCOUNTER — Encounter: Payer: Self-pay | Admitting: Gynecology

## 2014-06-24 VITALS — BP 124/86 | Ht 58.75 in | Wt 134.0 lb

## 2014-06-24 DIAGNOSIS — R102 Pelvic and perineal pain: Secondary | ICD-10-CM

## 2014-06-24 DIAGNOSIS — N831 Corpus luteum cyst of ovary, unspecified side: Secondary | ICD-10-CM

## 2014-06-24 DIAGNOSIS — R8290 Unspecified abnormal findings in urine: Secondary | ICD-10-CM

## 2014-06-24 DIAGNOSIS — N83209 Unspecified ovarian cyst, unspecified side: Secondary | ICD-10-CM

## 2014-06-24 DIAGNOSIS — Z01419 Encounter for gynecological examination (general) (routine) without abnormal findings: Secondary | ICD-10-CM

## 2014-06-24 DIAGNOSIS — N949 Unspecified condition associated with female genital organs and menstrual cycle: Secondary | ICD-10-CM

## 2014-06-24 DIAGNOSIS — N83202 Unspecified ovarian cyst, left side: Secondary | ICD-10-CM

## 2014-06-24 DIAGNOSIS — R188 Other ascites: Secondary | ICD-10-CM

## 2014-06-24 DIAGNOSIS — R82998 Other abnormal findings in urine: Secondary | ICD-10-CM

## 2014-06-24 LAB — URINALYSIS W MICROSCOPIC + REFLEX CULTURE
BILIRUBIN URINE: NEGATIVE
GLUCOSE, UA: NEGATIVE mg/dL
Hgb urine dipstick: NEGATIVE
KETONES UR: NEGATIVE mg/dL
Leukocytes, UA: NEGATIVE
Nitrite: NEGATIVE
PH: 6 (ref 5.0–8.0)
Protein, ur: NEGATIVE mg/dL
Urobilinogen, UA: 0.2 mg/dL (ref 0.0–1.0)

## 2014-06-24 MED ORDER — MEDROXYPROGESTERONE ACETATE 150 MG/ML IM SUSP
150.0000 mg | Freq: Once | INTRAMUSCULAR | Status: AC
Start: 2014-06-24 — End: 2014-06-24
  Administered 2014-06-24: 150 mg via INTRAMUSCULAR

## 2014-06-24 NOTE — Addendum Note (Signed)
Addended by: Berna Spare A on: 06/24/2014 02:26 PM   Modules accepted: Orders

## 2014-06-24 NOTE — Patient Instructions (Signed)
Ecografa transvaginal (Transvaginal Ultrasound) La ecografa transvaginal es una ecografa plvica en la que se utiliza una probeta metlica que se coloca en la vagina, para observar los rganos femeninos. El ecgrafo enva ondas sonoras desde un transductor (sonda). Estas ondas sonoras chocan contra las estructuras del cuerpo (como un eco) y crean Proofreader. La imagen se observa en un monitor. Se denomina transvaginal debido a que la sonda se inserta dentro de la vagina. Puede haber una pequea molestia por la introduccin de la sonda. Esta prueba tambin puede realizarse [employer]. La ecografa endovaginal es otro nombre para la ecografa transvaginal. En una ecografa transabdominal, la sonda se coloca en la parte externa del abdomen. Este mtodo no ofrece imgenes tan buenas como la tcnica transvaginal. La ecogafa transvaginal se utiliza para observar alteraciones en el tracto genital femenino. Entre ellos se incluyen:  Problemas de infertilidad.  Malformaciones congnitas (defecto de nacimiento) del tero y los ovarios.  Tumores en el tero.  Hemorragias anormales.  Tumores y quistes de ovario.  Abscesos (tejidos inflamados y pus) en la pelvis.  Dolor abdominal o plvico sin causa aparente.  Infecciones plvicas. DURANTE EL EMBARAZO, SE UTILIZA PAR OBSERVAR:  Embarazos normales.  Un embarazo ectpico (embarazo fuera del tero).  Latidos cardacos fetales.  Anormalidades de la pelvis que no se observan bien con la ecografa transabdominal.  Sospecha de gemelos o embarazo mltiple.  Aborto inminente.  Problemas en el cuello del tero (cuello incompetente, no permanece cerrado para contener al beb).  Cuando se realiza una amniocentesis (se retira lquido de la bolsa [place], para ser [place]).  Al buscar anormalidades en el beb.  Para controlar el crecimiento, el desarrollo y la edad del feto.  Para medir la cantidad de lquido en el saco  amnitico.  Cuando se realiza una versin externa del beb (se lo mueve a Programmer, applications).  Evaluar al beb en embarazos de alto riesgo (perfil biofsico).  Si se sospecha el deceso del beb (muerte). En algunos casos, se utiliza un mtodo especial denominado ecografa con infusin salina, para una observacin ms precisa del tero. Se inyecta solucin salina (agua con sal) dentro del tero en pacientes no embarazadas para observar mejor su interior. Este mtodo no se Designer, fashion/clothing. La probeta tambin puede usarse para obtener biopsias de Educational psychologist, para drenar lquido de quistes de ovario y para Designer, jewellery un DIU (dispositivo intrauterino para el control de la natalidad) que no pueda [place]. PREPARACIN PARA LA PRUEBA La ecografa transvaginal se realiza con la vejiga vaca. La ecografa transabdominal se realiza con la vejiga llena. Podrn solicitarle que beba varios vasos de agua antes del examen. En algunos casos se realiza una ecografa transabdominal antes de la ecografa transvaginal para obervar los rganos del abdomen. PROCEDIMIENTO  Deber acostarse en una cama, con las rodillas dobladas y los pies en los estribos. La probeta se cubre con un condn. Dentro de la vagina y en la probeta se aplica un lubricante estril. El lubricante ayuda a transmitir las ondas sonoras y Fish farm manager la irritacin de la vagina. El mdico mover la sonda en el interior de la cavidad vaginal para escanear las estructuras plvicas. Un examen normal mostrar una pelvis normal y contenidos normales en su interior. Una prueba anormal mostrar anormalidades en la pelvis, la placenta o el beb. LAS CAUSAS DE UN RESULTADO ANORMAL PUEDEN SER:  Crecimientos o tumores en:  El tero.  Los ovarios.  La vagina.  Otras estructuras plvicas.  Crecimientos no cancerosos  en el tero y los ovarios.  El ovario se retuerce y se corta el suministro de [place] ([place]).  Las reas de  infeccin incluyen:  Enfermedad inflamatoria plvica.  Un absceso en la pelvis.  Ubicacin de un DIU. LOS PROBLEMAS QUE PUEDEN HALLARSE EN UNA MUJER EMBARAZADA SON:  Embarazo ectpico (embarazo fuera del tero).  Embarazos mltiples.  Dilatacin (apertura) precoz anormal del cuello del tero. Esto puede indicar un cuello incompetente y Biomedical scientist.  Aborto inminente.  Muerte fetal.  Los problemas con la placenta incluyen:  La placenta se ha desarrollado sobre la abertura del cuello del tero (placenta previa).  La placenta se ha separado anticipadamente en el tero (abrupcin placentaria).  La placenta se desarrolla en el msculo del tero (placenta acreta).  Tumores del Media planner, incluyendo la enfermedad trofoblstica gestacional. Se trata de un embarazo anormal en el que no hay feto. El tero se llena de quistes similares a uvas que en algunos casos son cancerosos.  Posicin incorrecta del feto (de nalgas, de vrtice).  Retraso del desarrollo fetal intrauterino (escaso desarrollo en el tero).  Anormalidades o infeccin fetal. RIESGOS Y COMPLICACIONES No hay riesgos conocidos para la ecografa. No se toman radiografas cuando se realiza una ecografa. Document Released: 02/02/2009 Document Revised: 01/09/2012 [facility] Patient Information 2015 [place]. This information is not intended to replace advice given to you by your health care provider. Make sure you discuss any questions you have with your health care provider. Marcador tumoral CA-125 (CA-125 Tumor Marker) El CA-125 es un marcador tumoral que sirve para Aeronautical engineer evolucin del cncer de ovario o de endometrio. PREPARACIN PARA EL ESTUDIO No se requiere Scientist, research (life sciences). HALLAZGOS NORMALES Adultos: 0a35unidades/ml (0a73miliunidades/l) Los rangos para los resultados normales pueden variar entre diferentes laboratorios y hospitales. Consulte siempre con su mdico despus de Psychologist, counselling  estudio para Armed forces logistics/support/administrative officer significado de los [place] y si los valores se consideran "dentro de los lmites normales". SIGNIFICADO DEL ESTUDIO  El mdico leer los resultados y Electrical engineer con usted sobre la importancia y el significado de los [place], as como las opciones de tratamiento y la necesidad de Optometrist pruebas adicionales, si fuera necesario. OBTENCIN DE LOS RESULTADOS DE LAS PRUEBAS Es su responsabilidad retirar el resultado del [place]. Consulte en el laboratorio cundo y cmo podr [employer]. Document Released: 08/07/2013 [facility] Patient Information 2015 [place]. This information is not intended to replace advice given to you by your health care provider. Make sure you discuss any questions you have with your health care provider. Quiste ovrico (Ovarian Cyst) Un quiste ovrico es una bolsa llena de lquido que se forma en el ovario. Los ovarios son los rganos pequeos que producen vulos en las mujeres. Se pueden formar varios tipos de [employer]. Benito Mccreedy no son cancerosos. Muchos de ellos no causan problemas y con frecuencia desaparecen solos. Algunos pueden provocar sntomas y requerir Clinical research associate. Los tipos ms comunes de quistes ovricos son los siguientes:  Quistes funcionales: estos quistes pueden aparecer todos los meses durante el ciclo menstrual. Esto es normal. Estos quistes suelen desaparecer con el prximo ciclo menstrual si la mujer no queda embarazada. En general, los quistes funcionales no tienen sntomas.  Endometriomas: estos quistes se forman a partir del tejido que recubre el tero. Tambin se denominan "quistes de chocolate" porque se llenan de sangre que se vuelve marrn. Este tipo de quiste puede provocar dolor en la zona inferior del abdomen durante la relacin sexual y con  el perodo menstrual.  Cistoadenomas: este tipo se desarrolla a partir de las clulas que se [place] en el exterior del ovario. Estos quistes pueden ser  muy grandes y causar dolor en la zona inferior del abdomen y durante la relacin sexual. Cindra Presume tipo de quiste puede girar sobre s mismo, cortar el suministro de Biochemist, clinical y causar un dolor intenso. Tambin se puede romper con facilidad y Stage manager.  Quistes dermoides: este tipo de quiste a veces se encuentra en ambos ovarios. Estos quistes pueden [employer] tipos de tejidos del organismo, como piel, dientes, pelo o Database administrator. Generalmente no tienen sntomas, a menos que sean muy grandes.  Quistes tecalutenicos: aparecen cuando se produce demasiada cantidad de cierta hormona (gonadotropina corinica humana) que estimula en exceso al ovario para que produzca vulos. Esto es ms frecuente despus de procedimientos que ayudan a la concepcin de un beb (fertilizacin in vitro). CAUSAS   Los medicamentos para la fertilidad pueden provocar una afeccin mediante la cual se forman mltiples quistes de gran tamao en los ovarios. Esta se denomina sndrome de hiperestimulacin ovrica.  El sndrome del ovario poliqustico es una afeccin que puede causar desequilibrios hormonales, los cuales pueden dar como resultado quistes ovricos no funcionales. SIGNOS Y SNTOMAS  Muchos quistes ovricos no causan sntomas. Si se presentan sntomas, stos pueden ser:  Dolor o molestias en la pelvis.  Dolor en la parte baja del abdomen.  [place].  Aumento del permetro abdominal (hinchazn).  Perodos menstruales anormales.  Aumento del [employer] perodos [place].  Cese de los perodos menstruales sin estar embarazada. DIAGNSTICO  Estos quistes se descubren comnmente durante un examen de rutina o una exploracin ginecolgica anual. Es posible que se ordenen otros estudios para obtener ms informacin sobre el [place]. Estos estudios pueden ser:  Engineer, materials.  Radiografas de la pelvis.  Tomografa computada.  Resonancia magntica.  Anlisis de  [place]. TRATAMIENTO  Muchos de los quistes ovricos desaparecen por s solos, sin tratamiento. Es probable que el mdico quiera controlar el quiste regularmente durante 2 o 30meses para ver si se produce algn cambio. En el caso de las mujeres en la menopausia, es particularmente importante controlar de cerca al quiste ya que el ndice de cncer de ovario en las mujeres menopusicas es ms alto. Cuando se requiere Clinical research associate, este puede incluir cualquiera de los siguientes:  Un procedimiento para drenar el quiste (aspiracin). Esto se puede realizar [employer] uso de [place] grande y [place]. Tambin se puede hacer a travs de un procedimiento laparoscpico, En este procedimiento, se inserta un tubo delgado que emite luz y que tiene una pequea cmara en un extremo (laparoscopio) a travs de una pequea incisin.  Ciruga para extirpar el quiste completo. Esto se puede realizar mediante una ciruga laparoscpica o Ardelia Mems ciruga abierta, la cual implica realizar una incisin ms grande en la parte inferior del abdomen.  Tratamiento hormonal o pldoras anticonceptivas. Estos mtodos a veces se usan para ayudar a Writer. [place] solo medicamentos de venta libre o recetados, segn las indicaciones del mdico.  Consulting civil engineer a las consultas de control con su mdico segn las indicaciones.  Hgase exmenes plvicos regulares y pruebas de Papanicolaou. SOLICITE ATENCIN MDICA SI:   Los perodos se atrasan, son irregulares, dolorosos o cesan.  El dolor plvico o abdominal no desaparece.  El abdomen se agranda o se hincha.  Siente presin en la vejiga o no puede  vaciarla completamente.  Siente dolor durante las [employer].  Tiene una sensacin de hinchazn, presin o Manufacturing systems engineer.  Pierde peso sin razn aparente.  Siente un Pharmacist, hospital.  Est estreida.  Pierde el apetito.  Le aparece acn.  Nota un  aumento del vello corporal y facial.  Elenore Rota de peso sin hacer modificaciones en su actividad fsica y en su dieta habitual.  Sospecha que est embarazada. SOLICITE ATENCIN MDICA DE INMEDIATO SI:   Siente cada vez ms dolor abdominal.  Tiene malestar estomacal (nuseas) y vomita.  Tiene fiebre que se presenta de [place] repentina.  Siente dolor abdominal al defecar.  Sus perodos menstruales son ms abundantes que lo habitual. ASEGRESE DE QUE:   Comprende estas instrucciones.  Controlar su afeccin.  Recibir ayuda de inmediato si no mejora o si empeora. Document Released: 07/27/2005 Document Revised: 10/22/2013 [facility] Patient Information 2015 [place]. This information is not intended to replace advice given to you by your health care provider. Make sure you discuss any questions you have with your health care provider.

## 2014-06-24 NOTE — Progress Notes (Addendum)
Rachel Ochoa [birth year] [id number]   History:    43 y.o.  who presented to the office today for her annual gynecological exam. Patient has for quite some time complaining of on and off left lower quadrant discomfort and previous ultrasounds have demonstrated small cysts which have resolved with time or with Depo-Provera injection. Her last ultrasound December 2014 demonstrated the following:  Patient with prior history transvaginal hysterectomy  Right ovary normal. Left ovary with a thick walled cyst measuring 27 x 24 x 20 mm irregular cystic lumen with internal low level echoes and blood flow was noted to the periphery. No fluid in the cul-de-sac. She had a normal CA 125 on that office visit. She was to return back in 6 months for followup ultrasound and is here for that today as well. Patient is being followed by Dr. Dareen Piano for her lupus. She has informing also that she has gluten allergy. She also has been evaluated because of multiple allergies with her immunologist. She is currently on plaquenil and prednisone daily. She had a normal bone density study this year.  She has a past history of transvaginal hysterectomy. Her vaccinations are up-to-date. Patient was family history of diabetes in her family.    Past medical history,surgical history, family history and social history were all reviewed and documented in the EPIC chart.  Gynecologic History Patient's last menstrual period was 03/01/2004. Contraception: status post hysterectomy Last Pap: 2005. Results were: normal Last mammogram: 2015. Results were: normal  Obstetric History OB History  Gravida Para Term Preterm AB SAB TAB Ectopic Multiple Living  # Outcome Date GA Lbr Len/2nd Weight Sex Delivery Anes PTL Lv  4 SAB           3 TRM     M SVD  N Y  2 TRM     M SVD  N Y  1 TRM     F SVD  N Y       ROS: A ROS was performed and pertinent positives and negatives are included in the history.  GENERAL:  No fevers or chills. HEENT: No change in vision, no earache, sore throat or sinus congestion. NECK: No pain or stiffness. CARDIOVASCULAR: No chest pain or pressure. No palpitations. PULMONARY: No shortness of breath, cough or wheeze. GASTROINTESTINAL: No abdominal pain, nausea, vomiting or diarrhea, melena or bright red blood per rectum. GENITOURINARY: No urinary frequency, urgency, hesitancy or dysuria. MUSCULOSKELETAL: No joint or muscle pain, no back pain, no recent trauma. DERMATOLOGIC: No rash, no itching, no lesions. ENDOCRINE: No polyuria, polydipsia, no heat or cold intolerance. No recent change in weight. HEMATOLOGICAL: No anemia or easy bruising or bleeding. NEUROLOGIC: No headache, seizures, numbness, tingling or weakness. PSYCHIATRIC: No depression, no loss of interest in normal activity or change in sleep pattern.     Exam: chaperone present  BP 124/86  Ht 4' 10.75" (1.492 m)  Wt 134 lb (60.782 kg)  BMI 27.30 kg/m2  LMP 03/01/2004  Body mass index is 27.3 kg/(m^2).  General appearance : Well developed well nourished female. No acute distress HEENT: Neck supple, trachea midline, no carotid bruits, no thyroidmegaly Lungs: Clear to auscultation, no rhonchi or wheezes, or rib retractions  Heart: Regular rate and rhythm, no murmurs or gallops Breast:Examined in sitting and supine position were symmetrical in appearance, no palpable masses or tenderness,  no skin retraction, no nipple inversion, no nipple discharge, no skin  discoloration, no axillary or supraclavicular lymphadenopathy Abdomen: no palpable masses or tenderness, no rebound or guarding Extremities: no edema or skin discoloration or tenderness  Pelvic:  Bartholin, Urethra, Skene Glands: Within normal limits             Vagina: No gross lesions or discharge  Cervix: Absent  Uterus absent  Adnexa  Without masses or tenderness  Anus and perineum  normal   Rectovaginal  normal sphincter tone without palpated masses or  tenderness             Hemoccult not indicated   Patient was complaining that her urine had an odor to it but she complained of no dysuria or frequency. Today's urinalysis:  Ultrasound today followup on left ovarian cyst and on and off left lower quadrant discomfort as follows:  Absent uterus. Right ovary normal. Left ovary enlarged with thick masses measuring 24 x 20 x 29 mm with positive color flow noted at the periphery. The left ovary surrounded by free fluid measuring 66 x 35 x 60 mm echo-free. Fluid in the cul-de-sac.   Assessment/Plan:  43 y.o. female for annual exam with a ruptured left ovarian cyst. We'll give patient a shot of Depo-Provera 150 mg IM. She will return back to the office in 3 months for followup ultrasound. We'll check a CA 125 today as well. PCP will be doing her blood work.  Note: This dictation was prepared with  Dragon/digital dictation along withSmart phrase technology. Any transcriptional errors that result from this process are unintentional.   [name], 11:59 AM 06/24/2014

## 2014-09-01 ENCOUNTER — Encounter: Payer: Self-pay | Admitting: Gynecology

## 2014-09-09 ENCOUNTER — Encounter: Payer: Self-pay | Admitting: Internal Medicine

## 2014-09-09 ENCOUNTER — Ambulatory Visit (INDEPENDENT_AMBULATORY_CARE_PROVIDER_SITE_OTHER): Payer: Managed Care, Other (non HMO) | Admitting: Internal Medicine

## 2014-09-09 VITALS — BP 115/82 | HR 82 | Temp 98.4°F | Wt 138.2 lb

## 2014-09-09 DIAGNOSIS — G44229 Chronic tension-type headache, not intractable: Secondary | ICD-10-CM

## 2014-09-09 MED ORDER — ESCITALOPRAM OXALATE 10 MG PO TABS: 10.0000 mg | ORAL_TABLET | Freq: Every day | ORAL | Status: DC

## 2014-09-09 NOTE — Progress Notes (Signed)
Subjective:    Patient ID: Rachel Ochoa, female    DOB: [birth year], 43 y.o.   MRN: [id number]  DOS:  09/09/2014 Type of visit - description : acute Interval history: Daily headaches for more than a year, located at the top of the head, worse on the right side, on and off, usually never goes away but there is days that is intense. Headache felt does have "pressure, pounding" Symptoms increase when she feels anxious or with certain foods. Uses Advil with some relief. Occasionally has blurred vision usually with a headache. Also associated dizziness on and off.   ROS Denies fever chills No sinus pain or congestion No diplopia, motor deficits or slurred speech. When asked, admits that feels anxious on and off although could not identify any triggers, things at home are okay.  Past Medical History  Diagnosis Date  . Epilepsy     better by age 43  . Elevated BP     without diagnosis of hypertension 2/11  . Dyslipidemia   . Abdominal pain     chronic sees GI IBS  . Leukocytopenia     thromboycytopenia, unspecified- sees hematology, rx oberservation  . IBS (irritable bowel syndrome)   . Migraine aura, persistent     saw neuro 12/10  . NSVD (normal spontaneous vaginal delivery)     X3  . Lupus (systemic lupus erythematosus) 11-2011    Past Surgical History  Procedure Laterality Date  . Cholecystectomy    . Dilation and curettage of uterus    . Tonsillectomy    . Breast surgery      BREAST REDUCTION  . Abdominal hysterectomy      TAH Only    History   Social History  . Marital Status: Married    Spouse Name: N/A    Number of Children: 3  . Years of Education: N/A   Occupational History  . Homemaker, part time job    Social History Main Topics  . Smoking status: Never Smoker   . Smokeless tobacco: Never Used  . Alcohol Use: Yes     Comment: rarely  . Drug Use: No  . Sexual Activity: Yes    Birth Control/ Protection: Surgical   Other Topics Concern  . Not  on file   Social History Narrative   [place]   Lives w/ husband and 3 children        Medication List       This list is accurate as of: 09/09/14  6:42 PM.  Always use your most recent med list.               ADVIL PO  Take 2 tablets by mouth as needed.     calcium carbonate 600 MG Tabs tablet  Commonly known as:  OS-CAL  Take 600 mg by mouth 2 (two) times daily with a meal.     EPIPEN 2-PAK 0.3 mg/0.3 mL Soaj injection  Generic drug:  EPINEPHrine  Inject 0.3 mg into the muscle once.     escitalopram 10 MG tablet  Commonly known as:  LEXAPRO  Take 1 tablet (10 mg total) by mouth daily.     hydroxychloroquine 200 MG tablet  Commonly known as:  PLAQUENIL  Take by mouth daily.     predniSONE 5 MG tablet  Commonly known as:  DELTASONE  Take 5 mg by mouth daily.           Objective:   Physical Exam BP 115/82 mmHg  Pulse 82  Temp(Src) 98.4 F (36.9 C) (Oral)  Wt 138 lb 4 oz (62.71 kg)  SpO2 100%  LMP 03/01/2004 General -- alert, well-developed, NAD.  Neck --FROM HEENT-- Not pale.  Face symmetric, sinuses not tender to palpation. Nose not congested.  Lungs -- normal respiratory effort, no intercostal retractions, no accessory muscle use, and normal breath sounds.  Heart-- normal rate, regular rhythm, no murmur.  Extremities-- no pretibial edema bilaterally  Neurologic--  alert & oriented X3. Speech normal, gait appropriate for age, strength symmetric and appropriate for age.  DTRs symmetric. EOMI, PERLA   Psych-- Cognition and judgment appear intact. Cooperative with normal attention span and concentration. Seems anxious, no depress       Assessment & Plan:

## 2014-09-09 NOTE — Assessment & Plan Note (Signed)
Migraines, One-year history of persistent, daily headache. Chart is reviewed, saw neurology in 2010 and she clearly had migraine headaches but declined medication. History of SLE, on prednisone, last visit with rheumatology ~ 1 months ago, she did talk to him about the migraines headache and was felt not to be related to SLE, and they were thinking about a referral to neurology. Had a MRI 02/2013 for headaches: Normal. The headaches are likely a combination of tension and migraines. She seems anxious, admits to some anxiety. She needs prophylactic therapy, Topamax versus SSRIs discuss, will start Lexapro. Abortive treatment with Motrin. Follow-up 4 weeks

## 2014-09-09 NOTE — Progress Notes (Signed)
Pre visit review using our clinic review tool, if applicable. No additional management support is needed unless otherwise documented below in the visit note. 

## 2014-09-09 NOTE — Patient Instructions (Signed)
Start Lexapro 1 tablet at bedtime Schedule a return visit 4-6 weeks from now

## 2014-10-01 ENCOUNTER — Ambulatory Visit (INDEPENDENT_AMBULATORY_CARE_PROVIDER_SITE_OTHER): Payer: Managed Care, Other (non HMO)

## 2014-10-01 ENCOUNTER — Encounter: Payer: Self-pay | Admitting: Gynecology

## 2014-10-01 ENCOUNTER — Ambulatory Visit (INDEPENDENT_AMBULATORY_CARE_PROVIDER_SITE_OTHER): Payer: Managed Care, Other (non HMO) | Admitting: Gynecology

## 2014-10-01 DIAGNOSIS — N83202 Unspecified ovarian cyst, left side: Secondary | ICD-10-CM

## 2014-10-01 DIAGNOSIS — R102 Pelvic and perineal pain: Secondary | ICD-10-CM

## 2014-10-01 DIAGNOSIS — N8320 Unspecified ovarian cysts: Secondary | ICD-10-CM

## 2014-10-01 NOTE — Progress Notes (Signed)
   Patient presented to the office today for follow-up ultrasound as a result of her past history of on and off left lower quadrant discomfort and ovarian cyst. Her history is as follows:  Ultrasound December 2014 demonstrated the following: Patient with prior history transvaginal hysterectomy  Right ovary normal. Left ovary with a thick walled cyst measuring 27 x 24 x 20 mm irregular cystic lumen with internal low level echoes and blood flow was noted to the periphery. No fluid in the cul-de-sac. She had a normal CA 125 on that office visit. She was to return back in 6 months for followup ultrasound and is here for that today as well. Patient is being followed by Dr. Dareen PianoAnderson for her lupus. She has informing also that she has gluten allergy. She also has been evaluated because of multiple allergies with her immunologist. She is currently on plaquenil and prednisone daily. She had a normal bone density study this year she had a follow-up ultrasound on 06/24/2014 that demonstrated the following:   Absent uterus. Right ovary normal. Left ovary enlarged with thick masses measuring 24 x 20 x 29 mm with positive color flow noted at the periphery. The left ovary surrounded by free fluid measuring 66 x 35 x 60 mm echo-free. Fluid in the cul-de-sac.Patient had a normal C1 25 on that day. She received a shot of Depo-Provera 150 mg IM was instructed to follow-up in 3 months if this is the reason for today's visit. She is totally asymptomatic. Ultrasound today:  Uterus absent. Right ovary normal. Left ovary normal. No fluid in the cul-de-sac. Vaginal cuff negative. Previous cyst left ovary not seen. Bilateral follicles less than 9 mm were noted.  Assessment/plan: Complete resolution of left ovarian cyst patient was reassured she will return to the office next year for annual exam or when necessary.

## 2015-02-16 ENCOUNTER — Other Ambulatory Visit: Payer: Self-pay | Admitting: Gynecology

## 2015-02-16 ENCOUNTER — Ambulatory Visit (HOSPITAL_COMMUNITY)
Admission: RE | Admit: 2015-02-16 | Payer: Managed Care, Other (non HMO) | Source: Ambulatory Visit | Admitting: Gynecology

## 2015-02-16 ENCOUNTER — Ambulatory Visit (HOSPITAL_COMMUNITY)
Admission: AD | Admit: 2015-02-16 | Discharge: 2015-02-17 | Disposition: A | Discharge status: Home / Self Care | Payer: Managed Care, Other (non HMO) | Source: Ambulatory Visit | Attending: Gynecology | Admitting: Gynecology

## 2015-02-16 ENCOUNTER — Encounter (HOSPITAL_COMMUNITY): Payer: Self-pay | Admitting: *Deleted

## 2015-02-16 ENCOUNTER — Inpatient Hospital Stay (HOSPITAL_COMMUNITY): Payer: Managed Care, Other (non HMO) | Admitting: Anesthesiology

## 2015-02-16 ENCOUNTER — Encounter (HOSPITAL_COMMUNITY)
Admission: AD | Disposition: A | Discharge status: Home / Self Care | Payer: Self-pay | Source: Ambulatory Visit | Attending: Gynecology

## 2015-02-16 ENCOUNTER — Ambulatory Visit (INDEPENDENT_AMBULATORY_CARE_PROVIDER_SITE_OTHER): Payer: Managed Care, Other (non HMO) | Admitting: Gynecology

## 2015-02-16 ENCOUNTER — Ambulatory Visit (INDEPENDENT_AMBULATORY_CARE_PROVIDER_SITE_OTHER): Payer: Managed Care, Other (non HMO)

## 2015-02-16 ENCOUNTER — Encounter: Payer: Self-pay | Admitting: Gynecology

## 2015-02-16 VITALS — BP 130/86

## 2015-02-16 DIAGNOSIS — R110 Nausea: Secondary | ICD-10-CM

## 2015-02-16 DIAGNOSIS — R102 Pelvic and perineal pain: Secondary | ICD-10-CM

## 2015-02-16 DIAGNOSIS — Z01818 Encounter for other preprocedural examination: Secondary | ICD-10-CM

## 2015-02-16 DIAGNOSIS — R188 Other ascites: Secondary | ICD-10-CM

## 2015-02-16 DIAGNOSIS — R112 Nausea with vomiting, unspecified: Secondary | ICD-10-CM | POA: Diagnosis not present

## 2015-02-16 DIAGNOSIS — Z8742 Personal history of other diseases of the female genital tract: Secondary | ICD-10-CM

## 2015-02-16 DIAGNOSIS — N831 Corpus luteum cyst: Secondary | ICD-10-CM | POA: Insufficient documentation

## 2015-02-16 DIAGNOSIS — N83209 Unspecified ovarian cyst, unspecified side: Secondary | ICD-10-CM

## 2015-02-16 DIAGNOSIS — Z9889 Other specified postprocedural states: Secondary | ICD-10-CM

## 2015-02-16 DIAGNOSIS — R1900 Intra-abdominal and pelvic swelling, mass and lump, unspecified site: Secondary | ICD-10-CM

## 2015-02-16 DIAGNOSIS — N839 Noninflammatory disorder of ovary, fallopian tube and broad ligament, unspecified: Secondary | ICD-10-CM | POA: Diagnosis not present

## 2015-02-16 DIAGNOSIS — N736 Female pelvic peritoneal adhesions (postinfective): Secondary | ICD-10-CM | POA: Insufficient documentation

## 2015-02-16 DIAGNOSIS — K661 Hemoperitoneum: Secondary | ICD-10-CM | POA: Insufficient documentation

## 2015-02-16 DIAGNOSIS — N838 Other noninflammatory disorders of ovary, fallopian tube and broad ligament: Secondary | ICD-10-CM

## 2015-02-16 DIAGNOSIS — N8320 Unspecified ovarian cysts: Secondary | ICD-10-CM | POA: Diagnosis not present

## 2015-02-16 DIAGNOSIS — N8353 Torsion of ovary, ovarian pedicle and fallopian tube: Secondary | ICD-10-CM | POA: Diagnosis not present

## 2015-02-16 HISTORY — PX: LAPAROSCOPIC OVARIAN CYSTECTOMY: SHX6248

## 2015-02-16 HISTORY — PX: LAPAROSCOPIC SALPINGO OOPHERECTOMY: SHX5927

## 2015-02-16 LAB — BASIC METABOLIC PANEL
Anion gap: 8 (ref 5–15)
BUN: 16 mg/dL (ref 6–23)
CALCIUM: 9.1 mg/dL (ref 8.4–10.5)
CO2: 27 mmol/L (ref 19–32)
CREATININE: 0.65 mg/dL (ref 0.50–1.10)
Chloride: 101 mmol/L (ref 96–112)
Glucose, Bld: 115 mg/dL — ABNORMAL HIGH (ref 70–99)
Potassium: 4.1 mmol/L (ref 3.5–5.1)
Sodium: 136 mmol/L (ref 135–145)

## 2015-02-16 LAB — ABO/RH: ABO/RH(D): A NEG

## 2015-02-16 LAB — URINALYSIS W MICROSCOPIC + REFLEX CULTURE
Bilirubin Urine: NEGATIVE
Glucose, UA: NEGATIVE mg/dL
Hgb urine dipstick: NEGATIVE
Ketones, ur: NEGATIVE mg/dL
Leukocytes, UA: NEGATIVE
NITRITE: NEGATIVE
Protein, ur: NEGATIVE mg/dL
SPECIFIC GRAVITY, URINE: 1.015 (ref 1.005–1.030)
UROBILINOGEN UA: 0.2 mg/dL (ref 0.0–1.0)
pH: 6 (ref 5.0–8.0)

## 2015-02-16 LAB — CBC
HCT: 38 % (ref 36.0–46.0)
Hemoglobin: 12.9 g/dL (ref 12.0–15.0)
MCH: 32.1 pg (ref 26.0–34.0)
MCHC: 33.9 g/dL (ref 30.0–36.0)
MCV: 94.5 fL (ref 78.0–100.0)
PLATELETS: 131 10*3/uL — AB (ref 150–400)
RBC: 4.02 MIL/uL (ref 3.87–5.11)
RDW: 12.4 % (ref 11.5–15.5)
WBC: 10.3 10*3/uL (ref 4.0–10.5)

## 2015-02-16 LAB — TYPE AND SCREEN
ABO/RH(D): A NEG
ANTIBODY SCREEN: NEGATIVE

## 2015-02-16 SURGERY — EXCISION, CYST, OVARY, LAPAROSCOPIC
Anesthesia: General | Laterality: Left

## 2015-02-16 MED ORDER — SUCCINYLCHOLINE CHLORIDE 20 MG/ML IJ SOLN
INTRAMUSCULAR | Status: AC
  Filled 2015-02-16: qty 1

## 2015-02-16 MED ORDER — MEPERIDINE HCL 25 MG/ML IJ SOLN: 6.2500 mg | INTRAMUSCULAR | Status: DC | PRN

## 2015-02-16 MED ORDER — ROCURONIUM BROMIDE 100 MG/10ML IV SOLN
INTRAVENOUS | Status: AC
  Filled 2015-02-16: qty 1

## 2015-02-16 MED ORDER — LACTATED RINGERS IV SOLN
INTRAVENOUS | Status: DC
  Administered 2015-02-16 (×2): via INTRAVENOUS

## 2015-02-16 MED ORDER — LACTATED RINGERS IV SOLN
INTRAVENOUS | Status: DC
  Administered 2015-02-17: 02:00:00 via INTRAVENOUS

## 2015-02-16 MED ORDER — PHENYLEPHRINE HCL 10 MG/ML IJ SOLN
INTRAMUSCULAR | Status: DC | PRN
  Administered 2015-02-16 (×2): 80 ug via INTRAVENOUS
  Administered 2015-02-16: 40 ug via INTRAVENOUS

## 2015-02-16 MED ORDER — ROCURONIUM BROMIDE 100 MG/10ML IV SOLN
INTRAVENOUS | Status: DC | PRN
  Administered 2015-02-16: 30 mg via INTRAVENOUS

## 2015-02-16 MED ORDER — FENTANYL CITRATE (PF) 250 MCG/5ML IJ SOLN
INTRAMUSCULAR | Status: AC
  Filled 2015-02-16: qty 5

## 2015-02-16 MED ORDER — HEPARIN SODIUM (PORCINE) 5000 UNIT/ML IJ SOLN
INTRAMUSCULAR | Status: AC
  Filled 2015-02-16: qty 1

## 2015-02-16 MED ORDER — PHENYLEPHRINE 40 MCG/ML (10ML) SYRINGE FOR IV PUSH (FOR BLOOD PRESSURE SUPPORT)
PREFILLED_SYRINGE | INTRAVENOUS | Status: AC
  Filled 2015-02-16: qty 10

## 2015-02-16 MED ORDER — OXYCODONE-ACETAMINOPHEN 5-325 MG PO TABS
1.0000 | ORAL_TABLET | Freq: Four times a day (QID) | ORAL | Status: DC | PRN
Start: 2015-02-16 — End: 2015-02-17
  Administered 2015-02-16 – 2015-02-17 (×4): 1 via ORAL
  Filled 2015-02-16 (×4): qty 1

## 2015-02-16 MED ORDER — KETOROLAC TROMETHAMINE 30 MG/ML IJ SOLN
INTRAMUSCULAR | Status: DC | PRN
  Administered 2015-02-16: 30 mg via INTRAVENOUS

## 2015-02-16 MED ORDER — LIDOCAINE HCL (CARDIAC) 20 MG/ML IV SOLN
INTRAVENOUS | Status: AC
  Filled 2015-02-16: qty 5

## 2015-02-16 MED ORDER — OXYCODONE HCL 5 MG/5ML PO SOLN: 5.0000 mg | Freq: Once | ORAL | Status: DC | PRN

## 2015-02-16 MED ORDER — MIDAZOLAM HCL 2 MG/2ML IJ SOLN
INTRAMUSCULAR | Status: AC
  Filled 2015-02-16: qty 2

## 2015-02-16 MED ORDER — FENTANYL CITRATE (PF) 100 MCG/2ML IJ SOLN: 25.0000 ug | INTRAMUSCULAR | Status: DC | PRN

## 2015-02-16 MED ORDER — GLYCOPYRROLATE 0.2 MG/ML IJ SOLN
INTRAMUSCULAR | Status: DC | PRN
Start: 2015-02-16 — End: 2015-02-16
  Administered 2015-02-16: 0.4 mg via INTRAVENOUS

## 2015-02-16 MED ORDER — BUPIVACAINE HCL (PF) 0.25 % IJ SOLN
INTRAMUSCULAR | Status: AC
  Filled 2015-02-16: qty 30

## 2015-02-16 MED ORDER — FAMOTIDINE IN NACL 20-0.9 MG/50ML-% IV SOLN
20.0000 mg | Freq: Once | INTRAVENOUS | Status: AC
  Administered 2015-02-16: 20 mg via INTRAVENOUS
  Filled 2015-02-16: qty 50

## 2015-02-16 MED ORDER — OXYCODONE HCL 5 MG PO TABS: 5.0000 mg | ORAL_TABLET | Freq: Once | ORAL | Status: DC | PRN

## 2015-02-16 MED ORDER — GENTAMICIN SULFATE 40 MG/ML IJ SOLN
INTRAVENOUS | Status: AC
  Administered 2015-02-16: 114.5 mL via INTRAVENOUS
  Filled 2015-02-16: qty 8.5

## 2015-02-16 MED ORDER — SCOPOLAMINE 1 MG/3DAYS TD PT72
MEDICATED_PATCH | TRANSDERMAL | Status: DC | PRN
  Administered 2015-02-16: 1 via TRANSDERMAL

## 2015-02-16 MED ORDER — METOCLOPRAMIDE HCL 5 MG/ML IJ SOLN
10.0000 mg | Freq: Four times a day (QID) | INTRAMUSCULAR | Status: DC
  Administered 2015-02-17 (×2): 10 mg via INTRAVENOUS
  Filled 2015-02-16 (×2): qty 2

## 2015-02-16 MED ORDER — NEOSTIGMINE METHYLSULFATE 10 MG/10ML IV SOLN
INTRAVENOUS | Status: DC | PRN
Start: 2015-02-16 — End: 2015-02-16
  Administered 2015-02-16: 3 mg via INTRAVENOUS

## 2015-02-16 MED ORDER — LIDOCAINE HCL (CARDIAC) 20 MG/ML IV SOLN
INTRAVENOUS | Status: DC | PRN
  Administered 2015-02-16: 50 mg via INTRAVENOUS

## 2015-02-16 MED ORDER — BUPIVACAINE HCL (PF) 0.25 % IJ SOLN
INTRAMUSCULAR | Status: DC | PRN
  Administered 2015-02-16: 28 mL

## 2015-02-16 MED ORDER — SUCCINYLCHOLINE CHLORIDE 20 MG/ML IJ SOLN
INTRAMUSCULAR | Status: DC | PRN
  Administered 2015-02-16: 100 mg via INTRAVENOUS

## 2015-02-16 MED ORDER — PROPOFOL 10 MG/ML IV BOLUS
INTRAVENOUS | Status: AC
  Filled 2015-02-16: qty 20

## 2015-02-16 MED ORDER — PREDNISONE 5 MG PO TABS
5.0000 mg | ORAL_TABLET | Freq: Every day | ORAL | Status: DC
  Filled 2015-02-16: qty 1

## 2015-02-16 MED ORDER — FENTANYL CITRATE (PF) 100 MCG/2ML IJ SOLN
INTRAMUSCULAR | Status: DC | PRN
  Administered 2015-02-16 (×2): 50 ug via INTRAVENOUS
  Administered 2015-02-16: 100 ug via INTRAVENOUS
  Administered 2015-02-16: 50 ug via INTRAVENOUS

## 2015-02-16 MED ORDER — HYDROXYCHLOROQUINE SULFATE 200 MG PO TABS
200.0000 mg | ORAL_TABLET | Freq: Every day | ORAL | Status: DC
  Filled 2015-02-16: qty 1

## 2015-02-16 MED ORDER — DEXAMETHASONE SODIUM PHOSPHATE 4 MG/ML IJ SOLN
INTRAMUSCULAR | Status: AC
  Filled 2015-02-16: qty 1

## 2015-02-16 MED ORDER — SCOPOLAMINE 1 MG/3DAYS TD PT72
MEDICATED_PATCH | TRANSDERMAL | Status: AC
  Filled 2015-02-16: qty 1

## 2015-02-16 MED ORDER — KETOROLAC TROMETHAMINE 30 MG/ML IJ SOLN
30.0000 mg | Freq: Once | INTRAMUSCULAR | Status: AC
  Administered 2015-02-16: 30 mg via INTRAMUSCULAR

## 2015-02-16 MED ORDER — PHENYLEPHRINE HCL 10 MG/ML IJ SOLN: INTRAMUSCULAR | Status: DC | PRN

## 2015-02-16 MED ORDER — METOCLOPRAMIDE HCL 5 MG/ML IJ SOLN: 10.0000 mg | Freq: Once | INTRAMUSCULAR | Status: DC | PRN

## 2015-02-16 MED ORDER — IBUPROFEN 800 MG PO TABS: 400.0000 mg | ORAL_TABLET | Freq: Four times a day (QID) | ORAL | Status: DC | PRN

## 2015-02-16 MED ORDER — CITRIC ACID-SODIUM CITRATE 334-500 MG/5ML PO SOLN
30.0000 mL | Freq: Once | ORAL | Status: AC
  Administered 2015-02-16: 30 mL via ORAL
  Filled 2015-02-16: qty 15

## 2015-02-16 MED ORDER — DEXAMETHASONE SODIUM PHOSPHATE 10 MG/ML IJ SOLN
INTRAMUSCULAR | Status: DC | PRN
  Administered 2015-02-16: 4 mg via INTRAVENOUS

## 2015-02-16 MED ORDER — ONDANSETRON HCL 4 MG/2ML IJ SOLN
INTRAMUSCULAR | Status: AC
Start: 2015-02-16 — End: 2015-02-16
  Filled 2015-02-16: qty 2

## 2015-02-16 MED ORDER — LACTATED RINGERS IR SOLN
Status: DC | PRN
  Administered 2015-02-16: 3000 mL

## 2015-02-16 MED ORDER — ESCITALOPRAM OXALATE 10 MG PO TABS
10.0000 mg | ORAL_TABLET | Freq: Every day | ORAL | Status: DC
  Filled 2015-02-16: qty 1

## 2015-02-16 MED ORDER — PROPOFOL 10 MG/ML IV BOLUS
INTRAVENOUS | Status: DC | PRN
  Administered 2015-02-16: 150 mg via INTRAVENOUS

## 2015-02-16 MED ORDER — MIDAZOLAM HCL 2 MG/2ML IJ SOLN
INTRAMUSCULAR | Status: DC | PRN
  Administered 2015-02-16: 2 mg via INTRAVENOUS

## 2015-02-16 MED ORDER — GLYCOPYRROLATE 0.2 MG/ML IJ SOLN
INTRAMUSCULAR | Status: AC
  Filled 2015-02-16: qty 2

## 2015-02-16 MED ORDER — CLINDAMYCIN PHOSPHATE 900 MG/50ML IV SOLN
900.0000 mg | INTRAVENOUS | Status: DC
Start: 2015-02-16 — End: 2015-02-16

## 2015-02-16 MED ORDER — GENTAMICIN SULFATE 40 MG/ML IJ SOLN: 5.0000 mg/kg | INTRAVENOUS | Status: DC

## 2015-02-16 MED ORDER — ONDANSETRON HCL 4 MG/2ML IJ SOLN
INTRAMUSCULAR | Status: DC | PRN
  Administered 2015-02-16: 4 mg via INTRAVENOUS

## 2015-02-16 MED ORDER — NEOSTIGMINE METHYLSULFATE 10 MG/10ML IV SOLN
INTRAVENOUS | Status: AC
  Filled 2015-02-16: qty 1

## 2015-02-16 SURGICAL SUPPLY — 35 items
ADH SKN CLS APL DERMABOND .7 (GAUZE/BANDAGES/DRESSINGS) ×1
BAG SPEC RTRVL LRG 6X4 10 (ENDOMECHANICALS)
BARRIER ADHS 3X4 INTERCEED (GAUZE/BANDAGES/DRESSINGS) IMPLANT
BRR ADH 4X3 ABS CNTRL BYND (GAUZE/BANDAGES/DRESSINGS)
CABLE HIGH FREQUENCY MONO STRZ (ELECTRODE) IMPLANT
CLOTH BEACON ORANGE TIMEOUT ST (SAFETY) ×3 IMPLANT
COVER MAYO STAND STRL (DRAPES) ×3 IMPLANT
DERMABOND ADVANCED (GAUZE/BANDAGES/DRESSINGS) ×2
DERMABOND ADVANCED .7 DNX12 (GAUZE/BANDAGES/DRESSINGS) IMPLANT
DRSG COVADERM PLUS 2X2 (GAUZE/BANDAGES/DRESSINGS) ×6 IMPLANT
DRSG OPSITE POSTOP 3X4 (GAUZE/BANDAGES/DRESSINGS) IMPLANT
FILTER SMOKE EVAC LAPAROSHD (FILTER) ×3 IMPLANT
GAUZE VASELINE 3X9 (GAUZE/BANDAGES/DRESSINGS) ×2 IMPLANT
GLOVE BIOGEL PI IND STRL 8 (GLOVE) ×1 IMPLANT
GLOVE BIOGEL PI INDICATOR 8 (GLOVE) ×2
GLOVE ECLIPSE 7.5 STRL STRAW (GLOVE) ×6 IMPLANT
GOWN STRL REUS W/TWL LRG LVL3 (GOWN DISPOSABLE) ×6 IMPLANT
LIQUID BAND (GAUZE/BANDAGES/DRESSINGS) IMPLANT
NS IRRIG 1000ML POUR BTL (IV SOLUTION) ×3 IMPLANT
PACK LAPAROSCOPY BASIN (CUSTOM PROCEDURE TRAY) ×3 IMPLANT
PAD POSITIONER PINK NONSTERILE (MISCELLANEOUS) ×3 IMPLANT
POUCH SPECIMEN RETRIEVAL 10MM (ENDOMECHANICALS) IMPLANT
PROTECTOR NERVE ULNAR (MISCELLANEOUS) ×3 IMPLANT
SET IRRIG TUBING LAPAROSCOPIC (IRRIGATION / IRRIGATOR) ×3 IMPLANT
SHEARS HARMONIC ACE PLUS 36CM (ENDOMECHANICALS) ×2 IMPLANT
SLEEVE XCEL OPT CAN 5 100 (ENDOMECHANICALS) ×3 IMPLANT
SOLUTION ELECTROLUBE (MISCELLANEOUS) IMPLANT
SUT VIC AB 3-0 PS2 18 (SUTURE) ×3
SUT VIC AB 3-0 PS2 18XBRD (SUTURE) ×1 IMPLANT
SUT VICRYL 0 UR6 27IN ABS (SUTURE) ×5 IMPLANT
TOWEL OR 17X24 6PK STRL BLUE (TOWEL DISPOSABLE) ×6 IMPLANT
TRAY FOLEY CATH SILVER 14FR (SET/KITS/TRAYS/PACK) ×3 IMPLANT
TROCAR XCEL NON-BLD 11X100MML (ENDOMECHANICALS) ×3 IMPLANT
TROCAR XCEL NON-BLD 5MMX100MML (ENDOMECHANICALS) ×3 IMPLANT
WARMER LAPAROSCOPE (MISCELLANEOUS) ×3 IMPLANT

## 2015-02-16 NOTE — Progress Notes (Deleted)
   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 For the past several weeks she's had left lower quadrant discomfort on and off. She's had occasional urinary frequency but no dysuria and reports normal bowel movements no blood in her stool. Review of her record indicated she's had the symptoms in the past and she's had history of left ovarian cyst. She has a past history of transvaginal hysterectomy. Her last ultrasound was here in the office in December 2015 with following result:  Uterus absent. Right ovary normal. Left ovary normal. No fluid in the cul-de-sac. Vaginal cuff negative. Previous cyst left ovary not seen. Bilateral follicles less than 9 mm were noted.  Exam: Blood pressure 130/86 Gen. Appearance: Well-developed well developed female complaining left lower quadrant pain and nausea Abdomen: Soft mildly tender in left lower quadrant with no rebound. Positive bowel sounds Back: No CVA tenderness Pelvic exam: Bartholin urethra Skene was within normal limits Vagina: No lesions or discharge Vaginal cuff intact Bimanual exam tenderness in the left lower quadrant with some guarding Rectovaginal exam same as above  Urinalysis negative  As we wait for ultrasound here in the office in the next 30 minutes patient was given Toradol 30 mg IM as well as Zofran ODT 8 mg sublingual.  Ultrasound report today:

## 2015-02-16 NOTE — Progress Notes (Signed)
Rachel Ochoa is an 44 y.o. female. 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 For the past several weeks she's had left lower quadrant discomfort on and off. She's had occasional urinary frequency but no dysuria and reports normal bowel movements no blood in her stool. Review of her record indicated she's had the symptoms in the past and she's had history of left ovarian cyst. She has a past history of transvaginal hysterectomy. Her last ultrasound was here in the office in December 2015 with following result:  Uterus absent. Right ovary normal. Left ovary normal. No fluid in the cul-de-sac. Vaginal cuff negative. Previous cyst left ovary not seen. Bilateral follicles less than 9 mm were noted.  Exam: Blood pressure 130/86 Gen. Appearance: Well-developed well developed female complaining left lower quadrant pain and nausea Abdomen: Soft mildly tender in left lower quadrant with no rebound. Positive bowel sounds Back: No CVA tenderness Pelvic exam: Bartholin urethra Skene was within normal limits Vagina: No lesions or discharge Vaginal cuff intact Bimanual exam tenderness in the left lower quadrant with some guarding Rectovaginal exam same as above  Urinalysis negative  As we wait for ultrasound here in the office in the next 30 minutes patient was given Toradol 30 mg IM as well as Zofran ODT 8 mg sublingual.  Ultrasound report today: Absent uterus right ovary normal left ovary enlarged with thick walled cystic solid mass 49 x 39 x 46 mm recessed 4.5 cm avascular arterial blood flow seen at the remove the left ovary echogenic fluid seen surrounding the left ovary fluid 8.4 x 5.2 x 8.4 cm    Pertinent Gynecological History: Menses: none Bleeding: none Contraception: none DES exposure: unknown Blood transfusions: none Sexually transmitted diseases: no past  history Previous GYN Procedures: Total abdominal hysterectomy  Last mammogram: normal Date: 2015 Last pap: normal Date: well August 2015 OB History: G4 P3 3 vaginal deliveries and 1 elective AB  Menstrual History: Menarche age: 56 Patient's last menstrual period was 03/01/2004.    Past Medical History  Diagnosis Date  . Epilepsy     better by age 68  . Elevated BP     without diagnosis of hypertension 2/11  . Dyslipidemia   . Abdominal pain     chronic sees GI IBS  . Leukocytopenia     thromboycytopenia, unspecified- sees hematology, rx oberservation  . IBS (irritable bowel syndrome)   . Migraine aura, persistent     saw neuro 12/10  . NSVD (normal spontaneous vaginal delivery)     X3  . Lupus (systemic lupus erythematosus) 11-2011    Past Surgical History  Procedure Laterality Date  . Cholecystectomy    . Dilation and curettage of uterus    . Tonsillectomy    . Breast surgery      BREAST REDUCTION  . Vaginal hysterectomy      TVH    Family History  Problem Relation Age of Onset  . Breast cancer Maternal Aunt   . Diabetes Father   . Hypertension Father   . Breast cancer Maternal Aunt     Social History:  reports that she has never smoked. She has never used smokeless tobacco. She reports that she drinks alcohol. She reports that she does not use illicit drugs.  Allergies:  Allergies  Allergen Reactions  . Latex Hives, Itching and Rash    Rash, itching, hives, has  epi pen at all times  . Amoxicillin     REACTION: rash and redness     (Not in a hospital admission)  REVIEW OF SYSTEMS: A ROS was performed and pertinent positives and negatives are included in the history.  GENERAL: No fevers or chills. HEENT: No change in vision, no earache, sore throat or sinus congestion. NECK: No pain or stiffness. CARDIOVASCULAR: No chest pain or pressure. No palpitations. PULMONARY: No shortness of breath, cough or wheeze. GASTROINTESTINAL: No abdominal pain, nausea,  vomiting or diarrhea, melena or bright red blood per rectum. GENITOURINARY: No urinary frequency, urgency, hesitancy or dysuria. MUSCULOSKELETAL: No joint or muscle pain, no back pain, no recent trauma. DERMATOLOGIC: No rash, no itching, no lesions. ENDOCRINE: No polyuria, polydipsia, no heat or cold intolerance. No recent change in weight. HEMATOLOGICAL: No anemia or easy bruising or bleeding. NEUROLOGIC: No headache, seizures, numbness, tingling or weakness. PSYCHIATRIC: No depression, no loss of interest in normal activity or change in sleep pattern.     Blood pressure 130/86, last menstrual period 03/01/2004.  Physical Exam:  HEENT:unremarkable Neck:Supple, midline, no thyroid megaly, no carotid bruits Lungs:  Clear to auscultation no rhonchi's or wheezes Heart:Regular rate and rhythm, no murmurs or gallops Breast Exam: Not done Abdomen: Soft tender left lower quadrant minimal rebound Pelvic:BUS within normal limits Vagina: No lesions or discharge Cervix: Absent Uterus: Absent Adnexa: Tenderness left lower quadrant fullness noted Extremities: No cords, no edema Rectal: Nontender  Results for orders placed or performed in visit on 02/16/15 (from the past 24 hour(s))  Urinalysis with Culture Reflex     Status: None   Collection Time: 02/16/15  4:27 PM  Result Value Ref Range   Color, Urine YELLOW YELLOW   APPearance CLEAR CLEAR   Specific Gravity, Urine 1.015 1.005 - 1.030   pH 6.0 5.0 - 8.0   Glucose, UA NEG NEG mg/dL   Bilirubin Urine NEG NEG   Ketones, ur NEG NEG mg/dL   Hgb urine dipstick NEG NEG   Protein, ur NEG NEG mg/dL   Urobilinogen, UA 0.2 0.0 - 1.0 mg/dL   Nitrite NEG NEG   Leukocytes, UA NEG NEG   Narrative   Performed at:  [facility]                [address]                [place], [place] [address]    No results found.  Assessment/Plan:Patient with apparent left hemorrhagic cyst and acute abdomen will be taken to the  operating room tonight for left ovarian cystectomy possible left salpingo-oophorectomy along with right salpingectomy the following risks were discussed with the patient:                         Patient was counseled as to the risk of surgery to include the following:  1. Infection (prohylactic antibiotics will be administered)  2. DVT/Pulmonary Embolism (prophylactic pneumo compression stockings will be used)  3.Trauma to internal organs requiring additional surgical procedure to repair any injury to     Internal organs requiring perhaps additional hospitalization days.  4.Hemmorhage requiring transfusion and blood products which carry risks such as             anaphylactic reaction, hepatitis and AIDS  In the event a operation on to complete a laparoscopically an open abdominal approach may be needed which may require additional hospitalization  days.  Patient had received literature information on the procedure scheduled and all her questions were answered and fully accepts all risk.   [facility] HMD5:04 PMTD@ional .       Ok Edwards 02/16/2015, 4:58 PM

## 2015-02-16 NOTE — Op Note (Signed)
Operative Note  02/16/2015  8:11 PM  PATIENT:  Rachel Ochoa  44 y.o. female  PRE-OPERATIVE DIAGNOSIS:  #1 acute abdomen #2 hemorrhagic ovarian cysts #3 possible ovarian torsion  POST-OPERATIVE DIAGNOSIS: #1 torsion of left tube and ovary #2 hemorrhagic ovarian cysts #3 hemoperitoneum #4 pelvic adhesions  PROCEDURE:  Procedure(s): Pelvic adhesio lysis Laparoscopic left salpingo-oophorectomy Right salpingectomy   SURGEON:  Reynaldo Minium M.D. first Asst. Leda Quail M.D.  ANESTHESIA:   general  FINDINGS: Left hemorrhagic cyst with torsion and hemoperitoneum. Evidence of previous hysterectomy. Normal right tube and ovary  DESCRIPTION OF OPERATION: The patient was taken to the operating room where she underwent successful general endotracheal anesthesia. A timeout was undertaken to properly identify the patient and voice out lower procedure to be performed. Because of patient's allergy to penicillin she was given gentamicin and Cleocin prophylactically. Also latex free gloves and catheters were used because of allergy. Patient had PSA stockings for DVT prophylaxis. The abdomen vagina were prepped and draped in usual sterile fashion. A Foley catheter had been inserted to monitor urinary output. A semilunar subumbilical incision was made followed by introduction of the 10/11 mm Optiview trocar. Once confirmation was made of interest into the peritoneal cavity under laparoscopic guidance 2 additional 5 mm ports were made on the right and left lower abdomen. It was noted at this time that there was hemoperitoneum and the pelvis was cut was irrigated with normal saline solution to remove the blood and clots for approximately 100 cc. Patient was then placed in Trendelenburg position. With the use of the Harmonic Scalpel adhesions near the left pelvic sidewall and sigmoid colon were freed thus allowing for visualization of the torsed left tube and ovary. The left ureter was identified. The left  infundibular pelvic ligament was coaptated and transected with Harmonic scalpel as well as the round ligament. The remaining nasal salpinx was coaptated and transected to free it from the pelvic sidewall and vaginal cuff. With this the left tube and ovary were free. Attention was then paid to the right adnexa a normal-appearing right ovary and tube were noted. The right fallopian tube was placed on traction and with the use of the Harmonic Scalpel the nasal salpinx was coaptated and transected to free the right fallopian tube from the right pelvic sidewall and vaginal cuff. Following this a 5 mm hysteroscope was placed on the patient's right lower abdomen. Through the subumbilical port and Endopouch was introduced and both left tube and ovary and right fallopian tube were retrieved and submitted for histological evaluation. Afterwards a systematic inspection of the pelvic cavity after copious irrigation with normal saline solution demonstrated a normal left infundibulopelvic ligament good hemostasis as was the right adnexa. The right ovary was left. Pictures from findings and after procedure were obtained. The appendix was not visualized. Patient with previous cholecystectomy normal-appearing liver surface. The pneumoperitoneum was removed the subumbilical fascia was closed with a running stitch of 0 Vicryl suture the subcutaneous tissue was reapproximated with 3-0 Vicryl suture. All 3 port site skin edges were reapproximated with Dermabond glue. For postoperative analgesia 0.25% Marcaine was infiltrated all 3 port sites for a total 28 cc. The patient had an average of 50 cc of urine and clear. The patient was extubated and transferred to recovery room with stable vital signs and receive Toradol 30 mg IV. Patient be Overnight for 23 hour observation. Sponge count needle count were correct.   ESTIMATED BLOOD LOSS: * 100 cc  Intake/Output Summary (Last  24 hours) at 02/16/15 2011 Last data filed at 02/16/15 2000   Gross per 24 hour  Intake   1800 ml  Output    150 ml  Net   1650 ml     BLOOD ADMINISTERED:none   LOCAL MEDICATIONS USED:  MARCAINE   0.25% subcutaneous 3 incision ports for a total 28 cc  SPECIMEN:  Source of Specimen:  #1 left tube and ovary #2 right fallopian tube   DISPOSITION OF SPECIMEN:  PATHOLOGY  COUNTS:  YES  PLAN OF CARE: Transfer to PACU  [facility] LLPFERNANDEZ,JUAN HMD8:11 PMTD@

## 2015-02-16 NOTE — Transfer of Care (Signed)
Immediate Anesthesia Transfer of Care Note  Patient: Rachel SchlatterSandra Ochoa  Procedure(s) Performed: Procedure(s) with comments: LAPAROSCOPIC OVARIAN CYSTECTOMY (Left) - want to put this at 7:30am and have the cast currently at 7:30am moved to follow this one. LAPAROSCOPIC SALPINGO OOPHORECTOMY (Left)  Patient Location: PACU  Anesthesia Type:General  Level of Consciousness: awake, alert  and oriented  Airway & Oxygen Therapy: Patient Spontanous Breathing and Patient connected to nasal cannula oxygen  Post-op Assessment: Report given to RN and Post -op Vital signs reviewed and stable  Post vital signs: Reviewed and stable  Last Vitals: There were no vitals filed for this visit.  Complications: No apparent anesthesia complications

## 2015-02-16 NOTE — Anesthesia Preprocedure Evaluation (Addendum)
Anesthesia Evaluation  Patient identified by MRN, date of birth, ID band Patient awake    Reviewed: Allergy & Precautions, NPO status , Patient's Chart, lab work & pertinent test results  Airway Mallampati: II  TM Distance: >3 FB Neck ROM: Full    Dental no notable dental hx. (+) Teeth Intact   Pulmonary shortness of breath,  breath sounds clear to auscultation  Pulmonary exam normal       Cardiovascular + Cardiac Stents negative cardio ROS  Rhythm:Regular Rate:Normal     Neuro/Psych  Headaches, Seizures -, Well Controlled,  TIAnegative psych ROS   GI/Hepatic Neg liver ROS,   Endo/Other  dyslipidemia  Renal/GU negative Renal ROS  negative genitourinary   Musculoskeletal SLE   Abdominal   Peds  Hematology Leukopenia Hx/o thrombocytopenia   Anesthesia Other Findings   Reproductive/Obstetrics Hemorrhagic left ovarian cyst                            Anesthesia Physical Anesthesia Plan  ASA: II and emergent  Anesthesia Plan: General   Post-op Pain Management:    Induction: Intravenous, Rapid sequence and Cricoid pressure planned  Airway Management Planned: Oral ETT  Additional Equipment:   Intra-op Plan:   Post-operative Plan: Extubation in OR  Informed Consent: I have reviewed the patients History and Physical, chart, labs and discussed the procedure including the risks, benefits and alternatives for the proposed anesthesia with the patient or authorized representative who has indicated his/her understanding and acceptance.   Dental advisory given  Plan Discussed with: CRNA, Anesthesiologist and Surgeon  Anesthesia Plan Comments:        Anesthesia Quick Evaluation

## 2015-02-16 NOTE — Anesthesia Procedure Notes (Addendum)
Procedure Name: Intubation Date/Time: 02/16/2015 6:32 PM Performed by: Shanon PayorGREGORY, Joelle Roswell M Pre-anesthesia Checklist: Patient identified, Emergency Drugs available, Suction available, Patient being monitored and Timeout performed Patient Re-evaluated:Patient Re-evaluated prior to inductionOxygen Delivery Method: Circle system utilized Preoxygenation: Pre-oxygenation with 100% oxygen Intubation Type: IV induction, Rapid sequence and Cricoid Pressure applied Laryngoscope Size: Miller and 2 Grade View: Grade I Tube type: Oral Tube size: 7.0 mm Number of attempts: 1 Airway Equipment and Method: Stylet Placement Confirmation: ETT inserted through vocal cords under direct vision,  positive ETCO2 and breath sounds checked- equal and bilateral Secured at: 20 cm Tube secured with: Tape Dental Injury: Teeth and Oropharynx as per pre-operative assessment    Performed by: Shanon PayorGREGORY, Charnice Zwilling M

## 2015-02-16 NOTE — OR Nursing (Signed)
Patient extubated in OR by Madison HickmanSuzanne Gregory, CRNA. Earl LitesGregory was unable to chart extubation in EPIC, charted by Eden EmmsLou Modupe Shampine, Rn.

## 2015-02-16 NOTE — Anesthesia Postprocedure Evaluation (Signed)
  Anesthesia Post-op Note  Patient: Rachel SchlatterSandra Ochoa  Procedure(s) Performed: Procedure(s) (LRB): LAPAROSCOPIC OVARIAN CYSTECTOMY (Left) LAPAROSCOPIC SALPINGO OOPHORECTOMY (Left)  Patient Location: PACU  Anesthesia Type: General  Level of Consciousness: awake and alert   Airway and Oxygen Therapy: Patient Spontanous Breathing  Post-op Pain: mild  Post-op Assessment: Post-op Vital signs reviewed, Patient's Cardiovascular Status Stable, Respiratory Function Stable, Patent Airway and No signs of Nausea or vomiting  Last Vitals:  Filed Vitals:   02/16/15 2030  BP: 108/67  Pulse: 82  Temp:   Resp: 20    Post-op Vital Signs: stable   Complications: No apparent anesthesia complications

## 2015-02-17 ENCOUNTER — Encounter (HOSPITAL_COMMUNITY): Payer: Self-pay | Admitting: Gynecology

## 2015-02-17 DIAGNOSIS — N831 Corpus luteum cyst: Secondary | ICD-10-CM | POA: Diagnosis not present

## 2015-02-17 LAB — BASIC METABOLIC PANEL
Anion gap: 6 (ref 5–15)
BUN: 11 mg/dL (ref 6–23)
CALCIUM: 8.5 mg/dL (ref 8.4–10.5)
CO2: 27 mmol/L (ref 19–32)
CREATININE: 0.72 mg/dL (ref 0.50–1.10)
Chloride: 103 mmol/L (ref 96–112)
GFR calc Af Amer: >90 mL/min (ref >90)
GFR calc non Af Amer: >90 mL/min (ref >90)
GLUCOSE: 114 mg/dL — AB (ref 70–99)
Potassium: 4.6 mmol/L (ref 3.5–5.1)
Sodium: 136 mmol/L (ref 135–145)

## 2015-02-17 LAB — CBC
HCT: 35 % — ABNORMAL LOW (ref 36.0–46.0)
Hemoglobin: 11.6 g/dL — ABNORMAL LOW (ref 12.0–15.0)
MCH: 31.4 pg (ref 26.0–34.0)
MCHC: 33.1 g/dL (ref 30.0–36.0)
MCV: 94.9 fL (ref 78.0–100.0)
Platelets: 112 10*3/uL — ABNORMAL LOW (ref 150–400)
RBC: 3.69 MIL/uL — ABNORMAL LOW (ref 3.87–5.11)
RDW: 12.3 % (ref 11.5–15.5)
WBC: 7.4 10*3/uL (ref 4.0–10.5)

## 2015-02-17 MED ORDER — METOCLOPRAMIDE HCL 10 MG PO TABS: 10.0000 mg | ORAL_TABLET | Freq: Three times a day (TID) | ORAL | Status: DC

## 2015-02-17 MED ORDER — OXYCODONE-ACETAMINOPHEN 5-325 MG PO TABS: 1.0000 | ORAL_TABLET | Freq: Four times a day (QID) | ORAL | Status: DC | PRN

## 2015-02-17 NOTE — Progress Notes (Signed)
Pt discharged to home with husband.  Condition stable.  Offered to have Spanish language interpreter present for discharge instructions, but pt declined this service stating it was not necessary.  Pt ambulated to car with RN.  No equipment for home ordered at discharge.

## 2015-02-17 NOTE — Anesthesia Postprocedure Evaluation (Signed)
  Anesthesia Post-op Note  Patient: Rachel Ochoa  Procedure(s) Performed: Procedure(s) with comments: LAPAROSCOPIC OVARIAN CYSTECTOMY (Left) - want to put this at 7:30am and have the cast currently at 7:30am moved to follow this one. LAPAROSCOPIC SALPINGO OOPHORECTOMY (Left)  Patient Location: [facility]  Anesthesia Type:General  Level of Consciousness: awake, alert , oriented and patient cooperative  Airway and Oxygen Therapy: Patient Spontanous Breathing  Post-op Pain: mild  Post-op Assessment: Patient's Cardiovascular Status Stable and Respiratory Function Stable  Post-op Vital Signs: stable  Last Vitals:  Filed Vitals:   02/17/15 0559  BP: 101/70  Pulse: 98  Temp: 37.1 C  Resp: 20    Complications: No apparent anesthesia complications

## 2015-02-17 NOTE — Addendum Note (Signed)
Addendum  created 02/17/15 0908 by Earmon PhoenixValerie P Alexiah Koroma, CRNA   Modules edited: Notes Section   Notes Section:  File: [id number]

## 2015-02-17 NOTE — Discharge Summary (Signed)
Physician Discharge Summary  Patient ID: Rachel SchlatterSandra Giroux MRN: [id number] DOB/AGE: 1970-11-16 44 y.o.  Admit date: 02/16/2015 Discharge date: 02/17/2015  Admission Diagnoses: Acute abdomen, hemorrhagic cyst  Left adnexal mass suspected torsion   Discharge Diagnoses: First left hemorrhagic ovarian cyst with hemoperitoneum   Discharged Condition: good  Consults:None  Significant Diagnostic Studies: labs: preoperative hemoglobin 12.9 postop hemoglobin 11.6. Normal basic metabolic panel.  Treatments:surgery: Laparoscopic left salpingo-oophorectomy and right salpingectomy  Filed Vitals:   02/17/15 0559  BP: 101/70  Pulse: 98  Temp: 98.8 F (37.1 C)  Resp: 20         Hospital Course: Patient was taken to the operating room last night emergently as a result of patient's acute abdomen and suspected torsion of left adnexa. Patient underwent laparoscopic left salpingo-oophorectomy and right salpingectomy. She was kept in the hospital overnight. Her Foley catheter was discontinued at midnight. Her diet was advanced. She has been up and ambulating and tolerating regular diet well and voiding spontaneously and wishing to go home this morning. Pathology report pending.    Discharge Exam: Patient was stable vital signs afebrile Lungs: Clear to auscultation Rogers or wheezes Heart: Regular rate and rhythm no murmurs or gallops Abdomen: Soft nontender positive bowel sounds Pelvic exam: Not done Vaginal pad no blood Extremities no cords or edema    Significant Diagnostic Studies: labs: Preop hemoglobin 12.9 postop hemoglobin 11.6  Treatments: surgery: Laparoscopic left salpingo-oophorectomy right salpingectomy  Disposition:   Discharge Instructions    Call MD for:  redness, tenderness, or signs of infection (pain, swelling, bleeding, redness, odor or green/yellow discharge around incision site)    Complete by:  As directed      Call MD for:  severe or increased pain, loss or  decreased feeling  in affected limb(s)    Complete by:  As directed      Call MD for:  temperature >100.5    Complete by:  As directed      Discharge instructions    Complete by:  As directed   Cita con Dr. Lily PeerFernandez May 4 a las 11:00 AM Indicaciones postquirrgicas generales (Adultos) (Postsurgical Instructions, General, Adult)   Puede sentirse mareado, dbil y somnoliento en las 24 horas posteriores a la anestesia. La siguiente informacin es vlida para el perodo de Audiological scientistrecuperacin en las primeras 24 horas luego de la Azerbaijanciruga. No conduzca automviles ni bicicletas, no participe en actividades en las que pueda lastimarse, no tome transportes pblicos hasta que suspenda los analgsicos opiceos para Engineer, materialsaliviar el dolor y Engineer, manufacturinghasta tener el consentimiento del profesional que lo asiste.  No beba alcohol, no tome tranquilizantes o medicamentos que no hayan sido prescritos o permitidos por el cirujano.  No tome medicamentos que no hayan sido recetados por el profesional que lo asiste.  No firme papeles importantes o contratos por al Lowe's Companiesmenos durante 24 horas o [address] Avemientras se encuentre bajo los efectos de los medicamentos para Chief Technology Officerel dolor.  Haga que una persona responsable lo acompae.  CUIDADOS DE LA HERIDA: Cambie el vendaje cuando lo necesite o cuando se le haya indicado.  Es preferible una ducha a un bao de inmersin; sin embargo, consltelo con el profesional que lo asiste si tiene tubos insertados en la herida.  Evite levantar objetos pesados (ms de 4 kilos [10 libra]), empujarlos o jalar.  Evite las actividades que puedan poner daar la incisin (el corte realizado por el cirujano).  Solo tome medicamentos de venta libre o recetados para Chief Technology Officerel dolor, Environmental health practitionerel malestar o la [place]fiebre,  segn le haya indicado el mdico. No tome aspirina. Puede provocar sangrado. Tome los medicamentos que destruyen grmenes (antibiticos) segn le hayan indicado.  BUSQUE ATENCIN MDICA SI: Siente malestar estomacal (nuseas).  Comienza  a vomitar.  No puede comer o beber.  Usted tiene una temperatura oral de ms de 100.  Tiene estreimiento y no obtuvo alivio al [employer] la dieta o al aumentar la ingestin de lquidos. Los medicamentos para el dolor son Neomia Dear causa frecuente de estreimiento.  SOLICITE ATENCIN MDICA DE INMEDIATO SI: Siente un mareo persistente.  Tiene dificultad para respirar o tos hmeda (congestiva).  Usted tiene una temperatura oral de ms de 100 y no puede controlarla con medicamentos.  Aumenta el dolor o la sensibilidad cerca de la incisin.  Document Released: 07/27/2005 Document Re-Released: 04/06/2010 ExitCare Patient Information 2011 Marion Downer   [facility] HMD8:52 AMTD@     Driving Restrictions    Complete by:  As directed   No driving for 2-3 days     Lifting restrictions    Complete by:  As directed   No lifting for 1  weeks     Resume previous diet    Complete by:  As directed               Medication List    STOP taking these medications        EPIPEN 2-PAK 0.3 mg/0.3 mL Soaj injection  Generic drug:  EPINEPHrine      TAKE these medications        escitalopram 10 MG tablet  Commonly known as:  LEXAPRO  Take 1 tablet (10 mg total) by mouth daily.     hydroxychloroquine 200 MG tablet  Commonly known as:  PLAQUENIL  Take 200 mg by mouth daily.     ibuprofen 200 MG tablet  Commonly known as:  ADVIL,MOTRIN  Take 400 mg by mouth every 6 (six) hours as needed for headache or moderate pain.     metoCLOPramide 10 MG tablet  Commonly known as:  REGLAN  Take 1 tablet (10 mg total) by mouth 3 (three) times daily with meals.     oxyCODONE-acetaminophen 5-325 MG per tablet  Commonly known as:  PERCOCET/ROXICET  Take 1-2 tablets by mouth every 6 (six) hours as needed for severe pain.     predniSONE 5 MG tablet  Commonly known as:  DELTASONE  Take 5 mg by mouth daily.            SignedOk Edwards 02/17/2015, 8:58 AM

## 2015-03-04 ENCOUNTER — Ambulatory Visit (INDEPENDENT_AMBULATORY_CARE_PROVIDER_SITE_OTHER): Payer: Managed Care, Other (non HMO) | Admitting: Gynecology

## 2015-03-04 ENCOUNTER — Encounter: Payer: Self-pay | Admitting: Gynecology

## 2015-03-04 VITALS — BP 118/74

## 2015-03-04 DIAGNOSIS — Z09 Encounter for follow-up examination after completed treatment for conditions other than malignant neoplasm: Secondary | ICD-10-CM

## 2015-03-04 NOTE — Progress Notes (Signed)
   Patient presented to the office today for her 3 week postop visit she is status post laparoscopic left salpingo-oophorectomy with lysis of pelvic adhesions and right salpingectomy as a result of torsion of left hemorrhagic cyst. Patient is doing well. Procedure performed as well as pictures were shared with the patient as well as pathology report as follows:  FINDINGS: Left hemorrhagic cyst with torsion and hemoperitoneum. Evidence of previous hysterectomy. Normal right tube and ovary  Procedure performed: Laparoscopic left salpingo-oophorectomy with right salpingectomy and pelvic adhesio lysis  Diagnosis Ovary and fallopian tube, left - LEFT OVARY: BENIGN HEMORRHAGIC OVARY WITH BENIGN CORPUS LUTEUM CYST. - SEE MICROSCOPIC DESCRIPTION - LEFT FALLOPIAN TUBE: BENIGN, PARTIALLY HEMORRHAGIC FALLOPIAN TUBE. Microscopic Comment The ovary shows diffuse hemorrhage which is typical of torsion. Clinical correlation is suggested  Exam: Abdominal port sites completely healed suture removed from small subumbilical suture. Abdomen was soft nontender no rebound or guarding  Pelvic: Bartholin urethra Skene was within normal limits Vagina: No gross lesions on inspection Bimanual exam no palpable mass or tenderness Rectal exam: Not done  Assessment/plan: Patient 3 weeks status post laparoscopic left salpingo-oophorectomy along with right salpingectomy and pelvic adhesive lysis as result of a torsed left hemorrhagic cyst 3 doing well has recovered completely. Patient may resume full normal activity. We are going to check with pathology because there is no mention of the right fallopian tube having been analyze which according to my note had been removed and was submitted along with the left tube and ovary. Patient otherwise scheduled to return back to the office at the end of this year for annual exam.

## 2015-10-08 ENCOUNTER — Encounter: Payer: Self-pay | Admitting: Gynecology

## 2015-10-08 ENCOUNTER — Ambulatory Visit (INDEPENDENT_AMBULATORY_CARE_PROVIDER_SITE_OTHER): Payer: Managed Care, Other (non HMO) | Admitting: Gynecology

## 2015-10-08 VITALS — BP 128/76 | Ht 59.0 in | Wt 141.0 lb

## 2015-10-08 DIAGNOSIS — Z01419 Encounter for gynecological examination (general) (routine) without abnormal findings: Secondary | ICD-10-CM | POA: Diagnosis not present

## 2015-10-08 NOTE — Progress Notes (Signed)
Rachel SchlatterSandra Ochoa Dec 06, 1970 [id number]   History:    44 y.o.  for annual gyn exam with no complaints today. She may occasionally feels some twinges in her abdomen since her surgery. Review of her record indicated that in April of this year she underwent a laparoscopic left salpingo-oophorectomy and lysis of pelvic adhesion and right salpingectomy as a result of torsion of left hemorrhagic cyst. Pathology report demonstrated the following:  Diagnosis Ovary and fallopian tube, left - LEFT OVARY: BENIGN HEMORRHAGIC OVARY WITH BENIGN CORPUS LUTEUM CYST. - SEE MICROSCOPIC DESCRIPTION - LEFT FALLOPIAN TUBE: BENIGN, PARTIALLY HEMORRHAGIC FALLOPIAN TUBE. Microscopic Comment The ovary shows diffuse hemorrhage which is typical of torsion.   Patient with prior history transvaginal hysterectomy. Patient with history of lupus been followed by her rheumatologist. Since she is on chronic steroids we had done a bone density study in 2015 which was normal. Prior to her hysterectomy patient had no history of any abnormal Pap smears. She is overdue for mammogram.  Past medical history,surgical history, family history and social history were all reviewed and documented in the EPIC chart.  Gynecologic History Patient's last menstrual period was 03/01/2004. Contraception: status post hysterectomy Last Pap: 2012. Results were: normal Last mammogram: 2015. Results were: normal  Obstetric History OB History  Gravida Para Term Preterm AB SAB TAB Ectopic Multiple Living  4 3 3  1 1    3     # Outcome Date GA Lbr Len/2nd Weight Sex Delivery Anes PTL Lv  4 SAB           3 Term     M Vag-Spont  N Y  2 Term     M Vag-Spont  N Y  1 Term     F Vag-Spont  N Y       ROS: A ROS was performed and pertinent positives and negatives are included in the history.  GENERAL: No fevers or chills. HEENT: No change in vision, no earache, sore throat or sinus congestion. NECK: No pain or stiffness. CARDIOVASCULAR: No chest  pain or pressure. No palpitations. PULMONARY: No shortness of breath, cough or wheeze. GASTROINTESTINAL: No abdominal pain, nausea, vomiting or diarrhea, melena or bright red blood per rectum. GENITOURINARY: No urinary frequency, urgency, hesitancy or dysuria. MUSCULOSKELETAL: No joint or muscle pain, no back pain, no recent trauma. DERMATOLOGIC: No rash, no itching, no lesions. ENDOCRINE: No polyuria, polydipsia, no heat or cold intolerance. No recent change in weight. HEMATOLOGICAL: No anemia or easy bruising or bleeding. NEUROLOGIC: No headache, seizures, numbness, tingling or weakness. PSYCHIATRIC: No depression, no loss of interest in normal activity or change in sleep pattern.     Exam: chaperone present  BP 128/76 mmHg  Ht 4\' 11"  (1.499 m)  Wt 141 lb (63.957 kg)  BMI 28.46 kg/m2  LMP 03/01/2004  Body mass index is 28.46 kg/(m^2).  General appearance : Well developed well nourished female. No acute distress HEENT: Eyes: no retinal hemorrhage or exudates,  Neck supple, trachea midline, no carotid bruits, no thyroidmegaly Lungs: Clear to auscultation, no rhonchi or wheezes, or rib retractions  Heart: Regular rate and rhythm, no murmurs or gallops Breast:Examined in sitting and supine position were symmetrical in appearance, no palpable masses or tenderness,  no skin retraction, no nipple inversion, no nipple discharge, no skin discoloration, no axillary or supraclavicular lymphadenopathy Abdomen: no palpable masses or tenderness, no rebound or guarding Extremities: no edema or skin discoloration or tenderness  Pelvic:  Bartholin, Urethra, Skene Glands: Within normal  limits             Vagina: No gross lesions or discharge  Cervix: Absent  Uterus  absent  Adnexa  Without masses or tenderness  Anus and perineum  normal   Rectovaginal  normal sphincter tone without palpated masses or tenderness             Hemoccult not indicated     Assessment/Plan:  44 y.o. female for annual exam  will return back to the office tomorrow and a fasting state for her screening fasting blood work to include the following: Comprehensive metabolic panel, fasting lipid profile, TSH, CBC, and urinalysis. Requisition to schedule her mammogram was provided. We discussed importance of monthly breast exam. Discussed importance of calcium vitamin D and weightbearing exercises for osteoporosis prevention. Patient declined flu vaccine. Patient due for her own density study next year.   [name], 9:55 AM 10/08/2015

## 2015-10-09 ENCOUNTER — Other Ambulatory Visit: Payer: Managed Care, Other (non HMO)

## 2015-10-09 LAB — CBC WITH DIFFERENTIAL/PLATELET
BASOS ABS: 0 10*3/uL (ref 0.0–0.1)
Basophils Relative: 0 % (ref 0–1)
EOS ABS: 0 10*3/uL (ref 0.0–0.7)
Eosinophils Relative: 0 % (ref 0–5)
HCT: 41.1 % (ref 36.0–46.0)
Hemoglobin: 14.1 g/dL (ref 12.0–15.0)
LYMPHS PCT: 29 % (ref 12–46)
Lymphs Abs: 1.5 10*3/uL (ref 0.7–4.0)
MCH: 32.5 pg (ref 26.0–34.0)
MCHC: 34.3 g/dL (ref 30.0–36.0)
MCV: 94.7 fL (ref 78.0–100.0)
MPV: 12 fL (ref 8.6–12.4)
Monocytes Absolute: 0.4 10*3/uL (ref 0.1–1.0)
Monocytes Relative: 7 % (ref 3–12)
NEUTROS ABS: 3.4 10*3/uL (ref 1.7–7.7)
NEUTROS PCT: 64 % (ref 43–77)
Platelets: 153 10*3/uL (ref 150–400)
RBC: 4.34 MIL/uL (ref 3.87–5.11)
RDW: 13.4 % (ref 11.5–15.5)
WBC: 5.3 10*3/uL (ref 4.0–10.5)

## 2015-10-09 LAB — COMPREHENSIVE METABOLIC PANEL
ALK PHOS: 35 U/L (ref 33–115)
ALT: 13 U/L (ref 6–29)
AST: 17 U/L (ref 10–30)
Albumin: 4.1 g/dL (ref 3.6–5.1)
BILIRUBIN TOTAL: 0.5 mg/dL (ref 0.2–1.2)
BUN: 12 mg/dL (ref 7–25)
CO2: 24 mmol/L (ref 20–31)
CREATININE: 0.72 mg/dL (ref 0.50–1.10)
Calcium: 9.2 mg/dL (ref 8.6–10.2)
Chloride: 102 mmol/L (ref 98–110)
Glucose, Bld: 88 mg/dL (ref 65–99)
POTASSIUM: 4.3 mmol/L (ref 3.5–5.3)
SODIUM: 139 mmol/L (ref 135–146)
TOTAL PROTEIN: 7.1 g/dL (ref 6.1–8.1)

## 2015-10-09 LAB — URINALYSIS W MICROSCOPIC + REFLEX CULTURE
BILIRUBIN URINE: NEGATIVE
Bacteria, UA: NONE SEEN [HPF]
Casts: NONE SEEN [LPF]
Crystals: NONE SEEN [HPF]
Glucose, UA: NEGATIVE
Hgb urine dipstick: NEGATIVE
Ketones, ur: NEGATIVE
Leukocytes, UA: NEGATIVE
NITRITE: NEGATIVE
PH: 7 (ref 5.0–8.0)
Protein, ur: NEGATIVE
RBC / HPF: NONE SEEN RBC/HPF (ref <=2)
SPECIFIC GRAVITY, URINE: 1.015 (ref 1.001–1.035)
Squamous Epithelial / LPF: NONE SEEN [HPF] (ref <=5)
WBC UA: NONE SEEN WBC/HPF (ref <=5)
Yeast: NONE SEEN [HPF]

## 2015-10-09 LAB — LIPID PANEL
CHOLESTEROL: 176 mg/dL (ref 125–200)
HDL: 42 mg/dL — ABNORMAL LOW (ref >=46)
LDL Cholesterol: 105 mg/dL (ref <130)
Total CHOL/HDL Ratio: 4.2 Ratio (ref <=5.0)
Triglycerides: 147 mg/dL (ref <150)
VLDL: 29 mg/dL (ref <30)

## 2015-10-09 LAB — TSH: TSH: 1.971 u[IU]/mL (ref 0.350–4.500)

## 2015-10-19 LAB — CBC AND DIFFERENTIAL
HEMATOCRIT: 41 % (ref 36–46)
HEMOGLOBIN: 13.3 g/dL (ref 12.0–16.0)
Platelets: 137 10*3/uL — AB (ref 150–399)
WBC: 3.8 10*3/mL

## 2015-10-19 LAB — HEPATIC FUNCTION PANEL
ALT: 14 U/L (ref 7–35)
AST: 19 U/L (ref 13–35)
Alkaline Phosphatase: 40 U/L (ref 25–125)
BILIRUBIN, TOTAL: 0.2 mg/dL

## 2015-10-19 LAB — BASIC METABOLIC PANEL
BUN: 12 mg/dL (ref 4–21)
Creatinine: 0.7 mg/dL (ref 0.5–1.1)
GLUCOSE: 97 mg/dL
Potassium: 4.6 mmol/L (ref 3.4–5.3)
SODIUM: 140 mmol/L (ref 137–147)

## 2015-12-01 ENCOUNTER — Encounter: Payer: Self-pay | Admitting: Gynecology

## 2015-12-01 ENCOUNTER — Ambulatory Visit (INDEPENDENT_AMBULATORY_CARE_PROVIDER_SITE_OTHER): Payer: Managed Care, Other (non HMO) | Admitting: Gynecology

## 2015-12-01 ENCOUNTER — Other Ambulatory Visit: Payer: Self-pay | Admitting: Gynecology

## 2015-12-01 ENCOUNTER — Ambulatory Visit (INDEPENDENT_AMBULATORY_CARE_PROVIDER_SITE_OTHER): Payer: Managed Care, Other (non HMO)

## 2015-12-01 VITALS — BP 120/82

## 2015-12-01 DIAGNOSIS — N83201 Unspecified ovarian cyst, right side: Secondary | ICD-10-CM

## 2015-12-01 DIAGNOSIS — N839 Noninflammatory disorder of ovary, fallopian tube and broad ligament, unspecified: Secondary | ICD-10-CM | POA: Diagnosis not present

## 2015-12-01 DIAGNOSIS — R102 Pelvic and perineal pain: Secondary | ICD-10-CM | POA: Insufficient documentation

## 2015-12-01 DIAGNOSIS — N838 Other noninflammatory disorders of ovary, fallopian tube and broad ligament: Secondary | ICD-10-CM

## 2015-12-01 LAB — CBC WITH DIFFERENTIAL/PLATELET
Basophils Absolute: 0 10*3/uL (ref 0.0–0.1)
Basophils Relative: 0 % (ref 0–1)
EOS ABS: 0.1 10*3/uL (ref 0.0–0.7)
EOS PCT: 1 % (ref 0–5)
HCT: 41.1 % (ref 36.0–46.0)
Hemoglobin: 13.6 g/dL (ref 12.0–15.0)
LYMPHS ABS: 1.6 10*3/uL (ref 0.7–4.0)
Lymphocytes Relative: 27 % (ref 12–46)
MCH: 31.6 pg (ref 26.0–34.0)
MCHC: 33.1 g/dL (ref 30.0–36.0)
MCV: 95.6 fL (ref 78.0–100.0)
MONO ABS: 0.5 10*3/uL (ref 0.1–1.0)
MPV: 12.2 fL (ref 8.6–12.4)
Monocytes Relative: 8 % (ref 3–12)
Neutro Abs: 3.8 10*3/uL (ref 1.7–7.7)
Neutrophils Relative %: 64 % (ref 43–77)
Platelets: 164 10*3/uL (ref 150–400)
RBC: 4.3 MIL/uL (ref 3.87–5.11)
RDW: 12.9 % (ref 11.5–15.5)
WBC: 6 10*3/uL (ref 4.0–10.5)

## 2015-12-01 LAB — URINALYSIS W MICROSCOPIC + REFLEX CULTURE
BILIRUBIN URINE: NEGATIVE
CASTS: NONE SEEN [LPF]
CRYSTALS: NONE SEEN [HPF]
Glucose, UA: NEGATIVE
Hgb urine dipstick: NEGATIVE
KETONES UR: NEGATIVE
Leukocytes, UA: NEGATIVE
Nitrite: NEGATIVE
Protein, ur: NEGATIVE
RBC / HPF: NONE SEEN RBC/HPF (ref <=2)
Specific Gravity, Urine: <1.005 (ref 1.001–1.035)
WBC, UA: NONE SEEN WBC/HPF (ref <=5)
Yeast: NONE SEEN [HPF]
pH: 5.5 (ref 5.0–8.0)

## 2015-12-01 MED ORDER — MEDROXYPROGESTERONE ACETATE 150 MG/ML IM SUSP
150.0000 mg | Freq: Once | INTRAMUSCULAR | Status: AC
  Administered 2015-12-01: 150 mg via INTRAMUSCULAR

## 2015-12-01 NOTE — Addendum Note (Signed)
Addended by: Berna Spare A on: 12/01/2015 03:11 PM   Modules accepted: Orders

## 2015-12-01 NOTE — Patient Instructions (Signed)
Marcador tumoral CA-125 (CA-125 Tumor Marker Test) POR QU ME DEBO REALIZAR ESTA PRUEBA? Esta prueba tiene Radiographer, therapeutic nivel del antgeno del cncer125 (CA-125) en la sangre. La prueba de marcador tumoral CA-125 puede ser til para Public affairs consultant de ovario y se realiza solamente si se considera que la mujer corre un alto riesgo de presentar este tipo de Hotel manager. El mdico puede recomendarle esta prueba si:  Usted tiene importantes antecedentes familiares de cncer de ovario.  Usted tiene un defecto gentico en el antgeno del cncer de mama (BRCA, por sus siglas en ingls). Si ya le han diagnosticado cncer de ovario, el mdico puede hacerle esta prueba como ayuda para identificar el grado de la enfermedad y Chief Technology Officer cmo responde al tratamiento. QU TIPO DE MUESTRA SE TOMA? Para esta prueba, se extrae Truddie Coco de [place]. Por lo general, para extraerla, se introduce una aguja en una vena. P[place]all? No se requiere una preparacin para esta prueba. Guffey? Los valores de referencia son los valores saludables establecidos despus de realizarle el anlisis a un grupo grande de personas sanas. Pueden variar [employer], laboratorios y hospitales. Es su responsabilidad retirar el resultado del [place]. Consulte en el laboratorio o en el departamento en el que fue realizado el estudio cundo y cmo podr [employer]. El valor de referencia para esta prueba es de 0a 35unidades/ml o menos de 35unidades/l (unidades SI). Pescadero? Los niveles ms altos de CA-125 pueden indicar lo siguiente:  Ciertos tipos de cncer, entre ellos:  Cncer de ovario.  Cncer de pncreas.  Cncer de colon.  Cncer de pulmn.  Cncer de mama.  Linfomas.  Trastornos no cancerosos (benignos), entre ellos:  Cirrosis.  Embarazo.  Endometriosis.  Pancreatitis.  Enfermedad plvica  inflamatoria (EPI). Hable con el mdico [employer], las opciones de tratamiento y, si es necesario, la necesidad de Optometrist ms [place]. Hable con el mdico si tiene [employer].   Esta informacin no tiene Marine scientist el consejo del mdico. Asegrese de hacerle al mdico cualquier pregunta que tenga.   Document Released: 08/07/2013 Document Revised: 11/07/2014 Elsevier Interactive Patient Education 2016 [place] ovrico (Ovarian Cyst) Un quiste ovrico es una bolsa llena de lquido que se forma en el ovario. Los ovarios son los rganos pequeos que producen vulos en las mujeres. Se pueden formar varios tipos de [employer]. Benito Mccreedy no son cancerosos. Muchos de ellos no causan problemas y con frecuencia desaparecen solos. Algunos pueden provocar sntomas y requerir Clinical research associate. Los tipos ms comunes de quistes ovricos son los siguientes:  Quistes funcionales: estos quistes pueden aparecer todos los meses durante el ciclo menstrual. Esto es normal. Estos quistes suelen desaparecer con el prximo ciclo menstrual si la mujer no queda embarazada. En general, los quistes funcionales no tienen sntomas.  Endometriomas: estos quistes se forman a partir del tejido que recubre el tero. Tambin se denominan "quistes de chocolate" porque se llenan de sangre que se vuelve marrn. Este tipo de quiste puede Engineer, production en la zona inferior del abdomen durante la relacin sexual y con el perodo menstrual.  Cistoadenomas: este tipo se desarrolla a partir de las clulas que se [place] en el exterior del ovario. Estos quistes pueden ser muy grandes y causar dolor en la zona inferior del abdomen y durante la relacin sexual. Cindra Presume tipo de quiste puede girar sobre s mismo, cortar  el suministro de [place] y causar un dolor intenso. Tambin se puede romper con facilidad y Stage manager.  Quistes dermoides: este tipo de quiste a veces se  encuentra en ambos ovarios. Estos quistes pueden [employer] tipos de tejidos del organismo, como piel, dientes, pelo o Database administrator. Generalmente no tienen sntomas, a menos que sean muy grandes.  Quistes tecalutenicos: aparecen cuando se produce demasiada cantidad de cierta hormona (gonadotropina corinica humana) que estimula en exceso al ovario para que produzca vulos. Esto es ms frecuente despus de procedimientos que ayudan a la concepcin de un beb (fertilizacin in vitro). CAUSAS   Los medicamentos para la fertilidad pueden provocar una afeccin mediante la cual se forman mltiples quistes de gran tamao en los ovarios. Esta se denomina sndrome de hiperestimulacin ovrica.  El sndrome del ovario poliqustico es una afeccin que puede causar desequilibrios hormonales, los cuales pueden dar como resultado quistes ovricos no funcionales. SIGNOS Y SNTOMAS  Muchos quistes ovricos no causan sntomas. Si se presentan sntomas, stos pueden ser:  Dolor o molestias en la pelvis.  Dolor en la parte baja del abdomen.  Poplar.  Aumento del permetro abdominal (hinchazn).  Perodos menstruales anormales.  Aumento del [employer] perodos [place].  Cese de los perodos menstruales sin estar embarazada. DIAGNSTICO  Estos quistes se descubren comnmente durante un examen de rutina o una exploracin ginecolgica anual. Es posible que se ordenen otros estudios para obtener ms informacin sobre el [place]. Estos estudios pueden ser:  Engineer, materials.  Radiografas de la pelvis.  Tomografa computada.  Resonancia magntica.  Anlisis de [place]. TRATAMIENTO  Muchos de los quistes ovricos desaparecen por s solos, sin tratamiento. Es probable que el mdico quiera controlar el quiste regularmente durante 2 o 41mses para ver si se produce algn cambio. En el caso de las mujeres en la menopausia, es particularmente importante controlar de cerca al  quiste ya que el ndice de cncer de ovario en las mujeres menopusicas es ms alto. Cuando se requiere tClinical research associate este puede incluir cualquiera de los siguientes:  Un procedimiento para drenar el quiste (aspiracin). Esto se puede realizar mFamily Dollar Storesuso de uGuamgrande y u[place]amac Tambin se puede hacer a travs de un procedimiento laparoscpico, En este procedimiento, se inserta un tubo delgado que emite luz y que tiene una pequea cmara en un extremo (laparoscopio) a travs de una pequea incisin.  Ciruga para extirpar el quiste completo. Esto se puede realizar mediante una ciruga laparoscpica o uArdelia Memsciruga abierta, la cual implica realizar una incisin ms grande en la parte inferior del abdomen.  Tratamiento hormonal o pldoras anticonceptivas. Estos mtodos a veces se usan para ayudar a dWriter IGrand Prairiesolo medicamentos de venta libre o recetados, segn las indicaciones del mdico.  CConsulting civil engineera las consultas de control con su mdico segn las indicaciones.  Hgase exmenes plvicos regulares y pruebas de Papanicolaou. SOLICITE ATENCIN MDICA SI:   Los perodos se atrasan, son irregulares, dolorosos o cesan.  El dolor plvico o abdominal no desaparece.  El abdomen se agranda o se hincha.  Siente presin en la vejiga o no puede vaciarla completamente.  Siente dolor durante las rOffice Depot  Tiene una sensacin de hinchazn, presin o mManufacturing systems engineer  Pierde peso sin razn aparente.  Siente un mPharmacist, hospital  Est estreida.  Pierde el apetito.  Le aparece acn.  Nota un aumento del vello corporal y facial.  Aumenta de peso sin hacer modificaciones en su actividad fsica y en su dieta habitual.  Sospecha que est embarazada. SOLICITE ATENCIN MDICA DE INMEDIATO SI:   Siente cada vez ms dolor abdominal.  Tiene malestar estomacal (nuseas) y vomita.  Tiene fiebre que se  presenta de [place] repentina.  Siente dolor abdominal al defecar.  Sus perodos menstruales son ms abundantes que lo habitual. ASEGRESE DE QUE:   Comprende estas instrucciones.  Controlar su afeccin.  Recibir ayuda de inmediato si no mejora o si empeora.   Esta informacin no tiene Marine scientist el consejo del mdico. Asegrese de hacerle al mdico cualquier pregunta que tenga.   Document Released: 07/27/2005 Document Revised: 10/22/2013 Elsevier Interactive Patient Education [employer].

## 2015-12-01 NOTE — Progress Notes (Signed)
   Patient is a 45 year old who presented to the office today complaining of suprapubic to right lower quadrant discomfort which started a few days ago. Patient had been seen in the office for her annual exam on 10/08/2015 and was asymptomatic. Her record indicated in April 2016 she underwent a laparoscopic left salpingo-oophorectomy and lysis of pelvic adhesion and right salpingectomy as a result of torsion of left hemorrhagic cyst. Pathology report demonstrated the following:  Diagnosis Ovary and fallopian tube, left - LEFT OVARY: BENIGN HEMORRHAGIC OVARY WITH BENIGN CORPUS LUTEUM CYST. - SEE MICROSCOPIC DESCRIPTION - LEFT FALLOPIAN TUBE: BENIGN, PARTIALLY HEMORRHAGIC FALLOPIAN TUBE. Microscopic Comment The ovary shows diffuse hemorrhage which is typical of torsion.   Patient denied any nausea, vomiting, chills, or fever. She denies any frequency or any dysuria or any back pain. Patient denied any vaginal discharge. Patient monogamous relationship.  Exam: Gen. appearance well-developed well-nourished female in no acute distress Back: No CVA tenderness Abdomen: Soft nontender no rebound or guarding Pelvic: Bartholin urethra Skene was within normal limits Vagina: No lesions or discharge Vaginal cuff intact Bimanual exam tenderness and questionable fullness on the right adnexa Rectal exam not done  Urinalysis few bacteria will be submitted for culture  Ultrasound today: Right ovary thinwall cyst measuring 4.4 x 2.6 x 3.6 cm average size 3.5 cm with reticular echo pattern with positive color flow in the periphery. Her total blood flow was seen to the right ovary. Left adnexa negative no fluid cul-de-sac.  Assessment/plan: Patient with small hemorrhagic cyst will receive a shot of Depo-Provera 150 mg IM and returned to the office in 3 months for follow-up ultrasound. If her symptoms worsen she'll return back to the office. A CA 125 will be drawn today it's limitation were discussed with the  patient Spanish. She was asymptomatic today. She is taking Aleve in the past when necessary. We are also going to check a CBC today.

## 2015-12-02 LAB — URINE CULTURE
Colony Count: NO GROWTH
Organism ID, Bacteria: NO GROWTH

## 2015-12-02 LAB — CA 125: CA 125: 9 U/mL (ref <35)

## 2016-01-11 ENCOUNTER — Other Ambulatory Visit: Payer: Self-pay

## 2016-01-11 DIAGNOSIS — Z1231 Encounter for screening mammogram for malignant neoplasm of breast: Secondary | ICD-10-CM

## 2016-01-19 ENCOUNTER — Ambulatory Visit
Admission: RE | Admit: 2016-01-19 | Discharge: 2016-01-19 | Disposition: A | Discharge status: Home / Self Care | Payer: Managed Care, Other (non HMO) | Source: Ambulatory Visit

## 2016-01-19 DIAGNOSIS — Z1231 Encounter for screening mammogram for malignant neoplasm of breast: Secondary | ICD-10-CM

## 2016-02-18 LAB — BASIC METABOLIC PANEL
BUN: 18 mg/dL (ref 4–21)
Creatinine: 0.8 mg/dL (ref 0.5–1.1)
Glucose: 92 mg/dL
POTASSIUM: 4.6 mmol/L (ref 3.4–5.3)
SODIUM: 139 mmol/L (ref 137–147)

## 2016-02-18 LAB — CBC AND DIFFERENTIAL
HCT: 41 % (ref 36–46)
HEMOGLOBIN: 13.2 g/dL (ref 12.0–16.0)
Platelets: 136 10*3/uL — AB (ref 150–399)
WBC: 4.8 10^3/mL

## 2016-02-18 LAB — HEPATIC FUNCTION PANEL
ALT: 14 U/L (ref 7–35)
AST: 18 U/L (ref 13–35)
Alkaline Phosphatase: 37 U/L (ref 25–125)
Bilirubin, Total: 0.3 mg/dL

## 2016-02-22 ENCOUNTER — Encounter: Payer: Self-pay | Admitting: Gynecology

## 2016-02-22 ENCOUNTER — Ambulatory Visit (INDEPENDENT_AMBULATORY_CARE_PROVIDER_SITE_OTHER): Payer: Managed Care, Other (non HMO) | Admitting: Gynecology

## 2016-02-22 ENCOUNTER — Ambulatory Visit (INDEPENDENT_AMBULATORY_CARE_PROVIDER_SITE_OTHER): Payer: Managed Care, Other (non HMO)

## 2016-02-22 DIAGNOSIS — R102 Pelvic and perineal pain: Secondary | ICD-10-CM | POA: Diagnosis not present

## 2016-02-22 DIAGNOSIS — N83201 Unspecified ovarian cyst, right side: Secondary | ICD-10-CM

## 2016-02-22 DIAGNOSIS — Z8742 Personal history of other diseases of the female genital tract: Secondary | ICD-10-CM | POA: Diagnosis not present

## 2016-02-22 NOTE — Progress Notes (Signed)
   Patient is a 45 year old who presented to the office for 3 month follow-up on a right ovarian cyst. Her history is as follows:  "Patient is a [age] year old who presented to the office today complaining of suprapubic to right lower quadrant discomfort which started a few days ago. Patient had been seen in the office for her annual exam on 10/08/2015 and was asymptomatic. Her record indicated in April 2016 she underwent a laparoscopic left salpingo-oophorectomy and lysis of pelvic adhesion and right salpingectomy as a result of torsion of left hemorrhagic cyst. Pathology report demonstrated the following:  Diagnosis Ovary and fallopian tube, left - LEFT OVARY: BENIGN HEMORRHAGIC OVARY WITH BENIGN CORPUS LUTEUM CYST. - SEE MICROSCOPIC DESCRIPTION - LEFT FALLOPIAN TUBE: BENIGN, PARTIALLY HEMORRHAGIC FALLOPIAN TUBE. Microscopic Comment The ovary shows diffuse hemorrhage which is typical of torsion."  Patient had a normal CA 125 and a normal CBC. She received Depo-Provera 150 mg IM and was here for follow-up ultrasound. Of note review of her pathology report from her surgery last year fail to mention that a right fallopian tube had been removed as well which was described in my operative note. This information was shared with the patient as well. She is asymptomatic today.  Her ultrasound today demonstrated the following: Absent uterus and left salpingo-oophorectomy. Right ovary normal with follicles 15 x 11 mm. No fluid in the cul-de-sac. Previous right ovarian cyst not seen. Negative left adnexa.  Assessment/plan: Patient status post complete resolution of benign-appearing right ovarian cyst asymptomatic. Patient scheduled to return back in August of this year for annual exam or when necessary.

## 2016-04-01 ENCOUNTER — Encounter: Payer: Self-pay | Admitting: Internal Medicine

## 2016-08-22 LAB — CBC AND DIFFERENTIAL
HCT: 41 % (ref 36–46)
Hemoglobin: 13.7 g/dL (ref 12.0–16.0)
Platelets: 149 10*3/uL — AB (ref 150–399)
WBC: 5 10^3/mL

## 2016-08-22 LAB — POCT ERYTHROCYTE SEDIMENTATION RATE, NON-AUTOMATED: SED RATE: 3 mm

## 2016-10-06 ENCOUNTER — Telehealth: Payer: Self-pay | Admitting: Internal Medicine

## 2016-10-06 NOTE — Telephone Encounter (Signed)
Patient called stating that she is blowing blood out of her nose. Transferred to [facility].

## 2016-10-06 NOTE — Telephone Encounter (Signed)
Patient Name: Rachel SchlatterSANDRA Whorley  DOB: Mar 28, 1971    Initial Comment Caller states blowing blood from nose, every time she sneezes, blood comes out   Nurse Assessment  Nurse: Stefano GaulStringer, RN, Dwana CurdVera Date/Time (Eastern Time): 10/06/2016 12:41:55 PM  Confirm and document reason for call. If symptomatic, describe symptoms. ---Caller states she has been congested the past 2 days. When she sneezes, she has some blood coming out of her nose. When she blows her nose, blood comes out.  Does the patient have any new or worsening symptoms? ---Yes  Will a triage be completed? ---Yes  Related visit to physician within the last 2 weeks? ---No  Does the PT have any chronic conditions? (i.e. diabetes, asthma, etc.) ---Yes  List chronic conditions. ---lupus  Is the patient pregnant or possibly pregnant? (Ask all females between the ages of 6612-55) ---No  Is this a behavioral health or substance abuse call? ---No     Guidelines    Guideline Title Affirmed Question Affirmed Notes  Nosebleed [1] Bleeding recurs 3 or more times in 24 hours AND [2] direct pressure applied correctly    Final Disposition User   See Physician within 24 Hours Stefano GaulStringer, RN, Vera    Comments  appt scheduled for 10/07/16 at 8:45 am with Dr. Willow OraJose Paz   Referrals  REFERRED TO PCP OFFICE   Disagree/Comply: Comply

## 2016-10-06 NOTE — Telephone Encounter (Signed)
Appt scheduled as noted by [facility].

## 2016-10-07 ENCOUNTER — Ambulatory Visit (INDEPENDENT_AMBULATORY_CARE_PROVIDER_SITE_OTHER): Payer: Managed Care, Other (non HMO) | Admitting: Internal Medicine

## 2016-10-07 ENCOUNTER — Telehealth: Payer: Self-pay

## 2016-10-07 ENCOUNTER — Encounter: Payer: Self-pay | Admitting: Internal Medicine

## 2016-10-07 VITALS — BP 118/78 | HR 78 | Temp 98.2°F | Resp 14 | Ht 59.0 in | Wt 142.1 lb

## 2016-10-07 DIAGNOSIS — J0100 Acute maxillary sinusitis, unspecified: Secondary | ICD-10-CM | POA: Diagnosis not present

## 2016-10-07 DIAGNOSIS — Z09 Encounter for follow-up examination after completed treatment for conditions other than malignant neoplasm: Secondary | ICD-10-CM | POA: Insufficient documentation

## 2016-10-07 MED ORDER — AZITHROMYCIN 250 MG PO TABS: ORAL_TABLET | ORAL | 0 refills | Status: DC

## 2016-10-07 NOTE — Patient Instructions (Signed)
DESCANSE TOME MUCHOS LIQUIDOS TYLENOL MUCINEX dm USE UN HUMIDIFICADOR SI TIENE SANGRADO: USE AFRIN (SPRAY NASAL) UNA O 2 VECES, SI EL SANGRADO ES SEVERO: LLAME A LA OFFICINA SI NO MEJORA EN 2-3 DIAS: EMPIEZE EL ANTIBIOTICO

## 2016-10-07 NOTE — Assessment & Plan Note (Signed)
PLAN Mild sinusitis:  Nosebleeds in the context of mild sinusitis. She has a h/o thrombocytopenia however  But currently w/ no other bleeding issues. Will treat conservatively: Rest, fluids, Tylenol, Mucinex. Saline irrigation of the nose. Afrin prn  bleeding. if not better start a zpack. She is allergic to amoxicillin. Discussed rx in spanish. Systemic lupus: I haven't received any correspondence from rheumatology, will request records from Dr Kathi LudwigSyed

## 2016-10-07 NOTE — Progress Notes (Signed)
Pre visit review using our clinic review tool, if applicable. No additional management support is needed unless otherwise documented below in the visit note. 

## 2016-10-07 NOTE — Telephone Encounter (Signed)
[facility]Dr. Kathi LudwigSyed at [phone]. ROI sent for scanning. Awaiting medical records.

## 2016-10-07 NOTE — Progress Notes (Signed)
Subjective:    Patient ID: Rachel Ochoa, female    DOB: 1971-01-19, 45 y.o.   MRN: [id number]  DOS:  10/07/2016 Type of visit - description : Acute visit Interval history: Not feeling well for 10 days: Malaise, nasal congestion, mild cough. Symptoms are on and off, she is concerned because yesterday she saw bloody discharge from the nose without much mucus.   Review of Systems  + Subjective fever, some sore throat. Ears feel congested. Some nausea but no vomiting. + Frontal headache which is actually improving. Denies any blood in the stools or urine. No unusual bleeding   Past Medical History:  Diagnosis Date  . Abdominal pain    chronic sees GI IBS  . Dyslipidemia   . Elevated BP    without diagnosis of hypertension 2/11  . Epilepsy (HCC)    better by age 45  . IBS (irritable bowel syndrome)   . Leukocytopenia    thromboycytopenia, unspecified- sees hematology, rx oberservation  . Lupus (systemic lupus erythematosus) (HCC) 11-2011  . Migraine aura, persistent    saw neuro 12/10  . NSVD (normal spontaneous vaginal delivery)    X3    Past Surgical History:  Procedure Laterality Date  . BREAST SURGERY     BREAST REDUCTION  . CHOLECYSTECTOMY    . DILATION AND CURETTAGE OF UTERUS    . LAPAROSCOPIC OVARIAN CYSTECTOMY Left 02/16/2015   Procedure: LAPAROSCOPIC OVARIAN CYSTECTOMY;  Surgeon: Ok EdwardsJuan H Fernandez, MD;  Location: [facility];  Service: Gynecology;  Laterality: Left;  want to put this at 7:30am and have the cast currently at 7:30am moved to follow this one.  Marland Kitchen. LAPAROSCOPIC SALPINGO OOPHERECTOMY Left 02/16/2015   Procedure: LAPAROSCOPIC SALPINGO OOPHORECTOMY;  Surgeon: Ok EdwardsJuan H Fernandez, MD;  Location: [facility];  Service: Gynecology;  Laterality: Left;  . TONSILLECTOMY    . VAGINAL HYSTERECTOMY     [facility]    Social History   Social History  . Marital status: Married    Spouse name: N/A  . Number of children: 3  . Years of education: N/A   Occupational History  .  Homemaker, part time job Unemployed   Social History Main Topics  . Smoking status: Never Smoker  . Smokeless tobacco: Never Used  . Alcohol use 0.0 oz/week     Comment: rarely  . Drug use: No  . Sexual activity: Yes    Birth control/ protection: Surgical   Other Topics Concern  . Not on file   Social History Narrative   [place]   Lives w/ husband and 3 children        Medication List       Accurate as of 10/07/16  3:39 PM. Always use your most recent med list.          azithromycin 250 MG tablet Commonly known as:  ZITHROMAX Z-PAK 2 tabs a day the first day, then 1 tab a day x 4 days   hydroxychloroquine 200 MG tablet Commonly known as:  PLAQUENIL Take 200 mg by mouth daily.   ibuprofen 200 MG tablet Commonly known as:  ADVIL,MOTRIN Take 400 mg by mouth every 6 (six) hours as needed for headache or moderate pain.   predniSONE 5 MG tablet Commonly known as:  DELTASONE Take 5 mg by mouth daily. Reported on 12/01/2015          Objective:   Physical Exam BP 118/78 (BP Location: Left Arm, Patient Position: Sitting, Cuff Size: Small)   Pulse  78   Temp 98.2 F (36.8 C) (Oral)   Resp 14   Ht 4\' 11"  (1.499 m)   Wt 142 lb 2 oz (64.5 kg)   LMP 03/01/2004   SpO2 95%   BMI 28.71 kg/m  General:   Well developed, well nourished . NAD.  HEENT:  Normocephalic . Face symmetric, atraumatic. Nose quite congested, nasal voice. Ears normal, throat symmetric and not red. Sinuses mildly TTP bilaterally. Lungs:  CTA B Normal respiratory effort, no intercostal retractions, no accessory muscle use. Heart: RRR,  no murmur.  No pretibial edema bilaterally  Skin: No petechia noted at the face, neck or arms Neurologic:  alert & oriented X3.  Speech normal, gait appropriate for age and unassisted Psych--  Cognition and judgment appear intact.  Cooperative with normal attention span and concentration.  Behavior appropriate. No anxious or depressed  appearing.      Assessment & Plan:   Assessment Elevated BP Dyslipidemia Systemic lupus Leukocytopenia, thrombocytopenia. Saw hematology, Rx observation. See hem-onc if  platelets < 50K IBS Migraines, so neurology 2010 H/o vaginal hysterectomy  PLAN Mild sinusitis:  Nosebleeds in the context of mild sinusitis. She has a h/o thrombocytopenia however  But currently w/ no other bleeding issues. Will treat conservatively: Rest, fluids, Tylenol, Mucinex. Saline irrigation of the nose. Afrin prn  bleeding. if not better start a zpack. She is allergic to amoxicillin. Discussed rx in spanish. Systemic lupus: I haven't received any correspondence from rheumatology, will request records from Dr Kathi LudwigSyed

## 2016-10-10 ENCOUNTER — Encounter: Payer: Managed Care, Other (non HMO) | Admitting: Gynecology

## 2016-10-18 NOTE — Telephone Encounter (Signed)
Records received. Placed in PCP red folder for review.  °

## 2016-10-20 ENCOUNTER — Encounter: Payer: Self-pay | Admitting: Internal Medicine

## 2016-10-20 NOTE — Telephone Encounter (Signed)
Multiple office notes reviewed, she has systemic lupus, currently on hydroxychloroquine and prednisone, last CBC and BMP satisfactory. + ANA, + sjogren Anti-SS A, + anti-DNA

## 2016-11-03 ENCOUNTER — Encounter: Payer: Self-pay | Admitting: Gynecology

## 2016-11-03 ENCOUNTER — Ambulatory Visit (INDEPENDENT_AMBULATORY_CARE_PROVIDER_SITE_OTHER): Payer: Managed Care, Other (non HMO) | Admitting: Gynecology

## 2016-11-03 VITALS — BP 126/84 | Ht 59.0 in | Wt 145.0 lb

## 2016-11-03 DIAGNOSIS — Z8739 Personal history of other diseases of the musculoskeletal system and connective tissue: Secondary | ICD-10-CM

## 2016-11-03 DIAGNOSIS — Z01419 Encounter for gynecological examination (general) (routine) without abnormal findings: Secondary | ICD-10-CM

## 2016-11-03 DIAGNOSIS — K644 Residual hemorrhoidal skin tags: Secondary | ICD-10-CM | POA: Diagnosis not present

## 2016-11-03 DIAGNOSIS — M329 Systemic lupus erythematosus, unspecified: Secondary | ICD-10-CM

## 2016-11-03 NOTE — Patient Instructions (Addendum)
Densitometra sea (Bone Densitometry) La densitometra sea es un estudio de diagnstico por imgenes en el que se utiliza una radiografa especial que mide la cantidad de calcio y otros minerales en los huesos (densidad sea). Este estudio tambin se conoce como examen de densidad mineral sea o radioabsorciometra de doble energa (DEXA). Puede medir la densidad sea en la cadera y la columna. Es similar a Education administrator. Tambin pueden hacerle este estudio para:  Diagnosticar una enfermedad que causa huesos dbiles o delgados (osteoporosis).  Predecir el riesgo de un hueso roto (fractura).  Determinar si el tratamiento para la osteoporosis funciona. INFORME A SU MDICO:  Cualquier alergia que tenga.  Todos los [employer], incluidos vitaminas, hierbas, gotas oftlmicas, cremas y medicamentos de venta libre.  Problemas previos que usted o los [employer] de su familia hayan tenido con el uso de anestsicos.  Enfermedades de la sangre que tenga.  Si tiene cirugas previas.  Enfermedades que tenga.  Probabilidad de embarazo.  Cualquier otro estudio mdico al que se haya sometido en los ltimos 14 das en el que se haya utilizado material de [place]. RIESGOS Y COMPLICACIONES En general, se trata de un procedimiento seguro. Sin embargo, pueden ocurrir [employer], entre los que se pueden incluir los siguientes:  [place] estudio lo expone a una cantidad muy pequea de radiacin.  Los riesgos de la exposicin a la radiacin pueden ser [employer] para los nios por Associate Professor. ANTES DEL PROCEDIMIENTO  No tome ningn suplemento de calcio durante 24 horas antes de [employer]. Puede comer y beber como lo hace habitualmente.  Qutese todas las joyas de metal, anteojos, aparatos dentales y cualquier otro objeto metlico. PROCEDIMIENTO  Deber recostarse en una camilla. Un generador de rayos X estar ubicado debajo de usted y un dispositivo de imgenes, por  encima.  Se pueden usar otros dispositivos, como cajas o abrazaderas, para posicionar el cuerpo apropiadamente para la exploracin.  Deber permanecer inmvil mientras la mquina explore lentamente su cuerpo.  Las imgenes se Web designer monitor de una computadora. DESPUS DEL PROCEDIMIENTO Es posible que necesite estudios adicionales ms adelante. Esta informacin no tiene Marine scientist el consejo del mdico. Asegrese de hacerle al mdico cualquier pregunta que tenga. Document Released: 07/11/2012 Document Revised: 11/07/2014 Document Reviewed: 03/27/2014 Elsevier Interactive Patient Education  2017 [place] no quirrgicos para el tratamiento de las hemorroides, cuidados posteriores (Nonsurgical Procedure for Hemorrhoids, Care After) Siga estas instrucciones durante las prximas semanas. Estas indicaciones le proporcionan informacin acerca de cmo deber cuidarse despus del procedimiento. El mdico tambin podr darle instrucciones ms especficas. El tratamiento ha sido planificado segn las prcticas mdicas actuales, pero en algunos casos pueden ocurrir problemas. Comunquese con el mdico si tiene algn problema o dudas despus del procedimiento. QU ESPERAR DESPUS DEL PROCEDIMIENTO Despus del procedimiento, es comn [employer] siguientes sntomas:  Leve hemorragia rectal durante [employer].  Molestias o dolor sordo en la zona rectal. INSTRUCCIONES PARA EL CUIDADO EN EL HOGAR  Medicamentos   Tome los medicamentos de venta libre y los recetados solamente como se lo haya indicado el mdico.  Tome un laxante emoliente o un laxante formador de masa como se lo haya indicado el mdico. Actividad   Reanude sus actividades normales como se lo haya indicado el mdico. Pregntele al mdico qu actividades son seguras para usted.  No levante ningn objeto que pese ms de 10libras (4,5kg).  No permanezca sentado por largos perodos. Lorayne Marek  caminata  diaria o como se lo haya indicado el mdico.  No haga fuerza para defecar.  No pase mucho tiempo sentado en el inodoro. Comida y bebida   Consuma alimentos que contengan fibra, como cereales integrales, frijoles, frutos secos, frutas y verduras.  Beba suficiente lquido para Consulting civil engineer orina clara o de color amarillo plido. Instrucciones generales   Hgase baos de asiento con agua tibia 2 o 3veces por da para Best boy o la picazn.  Concurra a todas las visitas de control como se lo haya indicado el mdico. Esto es importante. SOLICITE ATENCIN MDICA SI:  El medicamento no Production designer, theatre/television/film.  Tiene fiebre.  Tiene estreimiento.  [place], el recto contina sangrando. SOLICITE ATENCIN MDICA DE INMEDIATO SI:  Siente mucho dolor rectal.  Tiene hemorragia rectal abundante. Esta informacin no tiene Marine scientist el consejo del mdico. Asegrese de hacerle al mdico cualquier pregunta que tenga. Document Released: 11/13/2015 Document Revised: 11/13/2015 Document Reviewed: 01/12/2015 Elsevier Interactive Patient Education  2017 [place] (Hemorrhoids) Las hemorroides son venas inflamadas adentro o alrededor del recto o del ano. Hay dos tipos de hemorroides:  Hemorroides internas. Se forman en las venas del interior del recto. Pueden abultarse hacia afuera, irritarse y doler.  Hemorroides externas. Se producen en las venas externas del ano y pueden sentirse como un bulto o zona hinchada, dura y dolorosa cerca del ano. [place] hemorroides no causan problemas graves y se Engineer, petroleum con tratamientos caseros [employer] cambios en la dieta y el estilo de vida. Si los tratamientos caseros no ayudan con los sntomas, se pueden Optometrist procedimientos para reducir o extirpar las hemorroides. CAUSAS La causa de esta afeccin es el aumento de la presin en la zona anal. Esta presin puede ser causada por distintos  factores, por ejemplo:  Estreimiento.  Dificultad para defecar.  Diarrea.  Embarazo.  Obesidad.  Estar sentado durante largos perodos.  Levantar objetos pesados u otras actividades que impliquen esfuerzo.  Sexo anal. SNTOMAS Los sntomas de esta afeccin incluyen lo siguiente:  Dolor.  Picazn o irritacin anal.  Sangrado rectal.  Prdida de materia fecal (heces).  Inflamacin anal.  Uno o ms bultos alrededor del ano. DIAGNSTICO Esta afeccin se diagnostica frecuentemente a travs de un examen visual. Posiblemente le realicen otros tipos de pruebas o estudios, como los siguientes:  Examen de la zona rectal con una mano enguantada (examen digital rectal).  Examen del canal anal utilizando un pequeo tubo (anoscopio).  Anlisis de sangre si ha perdido [place] cantidad significativa de [place].  Un estudio para observar el interior del colon (sigmoidoscopia o colonoscopia). TRATAMIENTO Esta afeccin generalmente se puede tratar en el hogar. Se pueden realizar diversos procedimientos si los cambios en la dieta, en el estilo de vida y otros tratamientos caseros no [employer]. Estos procedimientos pueden ayudar a reducir o [place] hemorroides completamente. Algunos de estos procedimientos son quirrgicos y otros no. Algunos de los procedimientos ms frecuentes son los siguientes:  Ligadura con Forensic psychologist. Las bandas elsticas se colocan en la base de las hemorroides para interrumpir la irrigacin de [place].  Escleroterapia. Se inyecta un medicamento en las hemorroides para reducir su tamao.  Coagulacin con luz infrarroja. Se utiliza un tipo de energa lumnica para eliminar las hemorroides.  Hemorroidectoma. Las hemorroides se extirpan con [place] y las venas que las [place] se IT consultant.  Hemorroidopexia con grapas. [place] un dispositivo tipo grapa de forma circular para extirpar  las hemorroides y unas grapas para cortar la sangre que se irriga hacia  las hemorroides. INSTRUCCIONES PARA EL CUIDADO EN EL HOGAR Comida y bebida   Consuma alimentos con alto contenido de [employer], como cereales integrales, porotos, frutos secos, frutas y verduras. Pregntele a su mdico acerca de tomar productos con fibra aadida en ellos (complementos de fibra).  Beba suficiente lquido para Consulting civil engineer orina clara o de color amarillo plido. Control del dolor y la hinchazn   Tome baos de asiento tibios durante 20 minutos, 3 o 4 veces por da para Glass blower/designer y las [place].  Si se lo indican, aplique hielo en la zona afectada. Usar compresas de [employer] baos de asiento puede ser [place].  Ponga el hielo en una bolsa plstica.  Coloque una toalla entre la piel y la bolsa de hielo.  Coloque el hielo durante 20 minutos, 2 a 3 veces por da. Instrucciones generales   [employer] de venta libre y los recetados solamente como se lo haya indicado el mdico.  Aplquese los medicamentos, cremas o supositorios como se lo hayan indicado.  Haga ejercicios regularmente.  Vaya al bao cuando sienta la necesidad de defecar. No espere.  Evite hacer fuerza al defecar.  Mantenga la zona anal limpia y seca. Use papel higinico hmedo o toallitas humedecidas despus de defecar.  No pase mucho tiempo sentado en el inodoro. Esto aumenta la afluencia de sangre y Conservation officer, historic buildings. SOLICITE ATENCIN MDICA SI:  Aumenta el dolor y la hinchazn, y no puede controlarlos con los medicamentos o con Lexicographer.  Tiene una hemorragia que no IT consultant.  No puede defecar o lo hace con dificultad.  Siente dolor o tiene inflamacin fuera de la zona de las hemorroides. Esta informacin no tiene Marine scientist el consejo del mdico. Asegrese de hacerle al mdico cualquier pregunta que tenga. Document Released: 10/17/2005 Document Revised: 02/08/2016 Document Reviewed: 07/01/2015 Elsevier Interactive Patient Education  2017 [employer].

## 2016-11-03 NOTE — Progress Notes (Signed)
Rachel Ochoa [birth year] [id number]   History:    46 y.o.  for annual gyn exam who is complaining is of a hemorrhoid.Her record indicated in April 2016 she underwent a laparoscopic left salpingo-oophorectomy and lysis of pelvic adhesion and right salpingectomy as a result of torsion of left hemorrhagic cyst. Pathology report demonstrated the following:  Diagnosis Ovary and fallopian tube, left - LEFT OVARY: BENIGN HEMORRHAGIC OVARY WITH BENIGN CORPUS LUTEUM CYST. - SEE MICROSCOPIC DESCRIPTION - LEFT FALLOPIAN TUBE: BENIGN, PARTIALLY HEMORRHAGIC FALLOPIAN TUBE. Microscopic Comment The ovary shows diffuse hemorrhage which is typical of torsion."  Patient had a normal CA 125 and a normal CBC. She received Depo-Provera 150 mg IM and was here for follow-up ultrasound. Of note review of her pathology report from her surgery last year fail to mention that a right fallopian tube had been removed as well which was described in my operative note. This information was shared with the patient as well. She is asymptomatic today.   Patient had a colonoscopy in 2017 as a result of her IBS. Patient with no prior history of normal Pap smears prior to or after her hysterectomy. She had a normal bone density study in 2015. She is followed by her rheumatologist for her lupus and her PCP is Dr. Phylis Bougie she has not had any lab work done in over year.  Past medical history,surgical history, family history and social history were all reviewed and documented in the EPIC chart.  Gynecologic History Patient's last menstrual period was 03/01/2004. Contraception: status post hysterectomy Last Pap: 2012. Results were: normal Last mammogram: 2017. Results were: normal  Obstetric History OB History  Gravida Para Term Preterm AB Living  4 3 3   1 3   SAB TAB Ectopic Multiple Live Births  1       3    # Outcome Date GA Lbr Len/2nd Weight Sex Delivery Anes PTL Lv  4 SAB           3 Term     M Vag-Spont  N LIV    2 Term     M Vag-Spont  N LIV  1 Term     F Vag-Spont  N LIV       ROS: A ROS was performed and pertinent positives and negatives are included in the history.  GENERAL: No fevers or chills. HEENT: No change in vision, no earache, sore throat or sinus congestion. NECK: No pain or stiffness. CARDIOVASCULAR: No chest pain or pressure. No palpitations. PULMONARY: No shortness of breath, cough or wheeze. GASTROINTESTINAL: No abdominal pain, nausea, vomiting or diarrhea, melena or bright red blood per rectum But painful hemorrhoid. GENITOURINARY: No urinary frequency, urgency, hesitancy or dysuria. MUSCULOSKELETAL: No joint or muscle pain, no back pain, no recent trauma. DERMATOLOGIC: No rash, no itching, no lesions. ENDOCRINE: No polyuria, polydipsia, no heat or cold intolerance. No recent change in weight. HEMATOLOGICAL: No anemia or easy bruising or bleeding. NEUROLOGIC: No headache, seizures, numbness, tingling or weakness. PSYCHIATRIC: No depression, no loss of interest in normal activity or change in sleep pattern.     Exam: chaperone present  BP 126/84   Ht 4\' 11"  (1.499 m)   Wt 145 lb (65.8 kg)   LMP 03/01/2004   BMI 29.29 kg/m   Body mass index is 29.29 kg/m.  General appearance : Well developed well nourished female. No acute distress HEENT: Eyes: no retinal hemorrhage or exudates,  Neck supple, trachea midline, no carotid bruits, no thyroidmegaly  Lungs: Clear to auscultation, no rhonchi or wheezes, or rib retractions  Heart: Regular rate and rhythm, no murmurs or gallops Breast:Examined in sitting and supine position were symmetrical in appearance, no palpable masses or tenderness,  no skin retraction, no nipple inversion, no nipple discharge, no skin discoloration, no axillary or supraclavicular lymphadenopathy Abdomen: no palpable masses or tenderness, no rebound or guarding Extremities: no edema or skin discoloration or tenderness  Pelvic:  Bartholin, Urethra, Skene Glands:  Within normal limits             Vagina: No gross lesions or discharge  Cervix: Absent  Uterus  Absent  Adnexa  Without masses or tenderness  Anus inflamed external hemorrhoid  Rectovaginal  normal sphincter tone without palpated masses or tenderness             Hemoccult not done     Assessment/Plan:  46 y.o. female for annual exam with inflamed external hemorrhoid. She was instructed to do sitz baths every night for the next 2 weeks and begin applying Preparation H twice a day. If no improvement she will contact the office and we will refer her to the general surgeon. Pap smear not indicated. She'll schedule a bone density study along with her fasting blood work in the next couple weeks (CBC, comprehensive metabolic panel, TSH, fasting lipid profile, and urinalysis).   Ok EdwardsFERNANDEZ,Andrienne Havener H MD, 4:00 PM 11/03/2016

## 2016-11-29 ENCOUNTER — Other Ambulatory Visit: Payer: Self-pay | Admitting: Gynecology

## 2016-11-29 ENCOUNTER — Other Ambulatory Visit: Payer: Managed Care, Other (non HMO)

## 2016-11-29 ENCOUNTER — Ambulatory Visit (INDEPENDENT_AMBULATORY_CARE_PROVIDER_SITE_OTHER): Payer: Managed Care, Other (non HMO)

## 2016-11-29 DIAGNOSIS — Z13220 Encounter for screening for lipoid disorders: Secondary | ICD-10-CM

## 2016-11-29 DIAGNOSIS — Z13820 Encounter for screening for osteoporosis: Secondary | ICD-10-CM

## 2016-11-29 DIAGNOSIS — R7989 Other specified abnormal findings of blood chemistry: Secondary | ICD-10-CM

## 2016-11-29 DIAGNOSIS — Z8739 Personal history of other diseases of the musculoskeletal system and connective tissue: Secondary | ICD-10-CM

## 2016-11-29 DIAGNOSIS — M329 Systemic lupus erythematosus, unspecified: Secondary | ICD-10-CM

## 2016-11-29 DIAGNOSIS — Z01419 Encounter for gynecological examination (general) (routine) without abnormal findings: Secondary | ICD-10-CM

## 2016-11-29 LAB — COMPREHENSIVE METABOLIC PANEL
ALK PHOS: 39 U/L (ref 33–115)
ALT: 18 U/L (ref 6–29)
AST: 21 U/L (ref 10–30)
Albumin: 4.2 g/dL (ref 3.6–5.1)
BILIRUBIN TOTAL: 0.5 mg/dL (ref 0.2–1.2)
BUN: 14 mg/dL (ref 7–25)
CALCIUM: 9 mg/dL (ref 8.6–10.2)
CO2: 29 mmol/L (ref 20–31)
Chloride: 104 mmol/L (ref 98–110)
Creat: 0.79 mg/dL (ref 0.50–1.10)
Glucose, Bld: 86 mg/dL (ref 65–99)
POTASSIUM: 4.2 mmol/L (ref 3.5–5.3)
Sodium: 139 mmol/L (ref 135–146)
TOTAL PROTEIN: 7 g/dL (ref 6.1–8.1)

## 2016-11-29 LAB — URINALYSIS W MICROSCOPIC + REFLEX CULTURE
BACTERIA UA: NONE SEEN [HPF]
BILIRUBIN URINE: NEGATIVE
Casts: NONE SEEN [LPF]
Crystals: NONE SEEN [HPF]
GLUCOSE, UA: NEGATIVE
Hgb urine dipstick: NEGATIVE
KETONES UR: NEGATIVE
LEUKOCYTES UA: NEGATIVE
Nitrite: NEGATIVE
RBC / HPF: NONE SEEN RBC/HPF (ref <=2)
SPECIFIC GRAVITY, URINE: 1.02 (ref 1.001–1.035)
WBC UA: NONE SEEN WBC/HPF (ref <=5)
Yeast: NONE SEEN [HPF]
pH: 8.5 — ABNORMAL HIGH (ref 5.0–8.0)

## 2016-11-29 LAB — CBC WITH DIFFERENTIAL/PLATELET
Basophils Absolute: 0 cells/uL (ref 0–200)
Basophils Relative: 0 %
EOS ABS: 36 {cells}/uL (ref 15–500)
Eosinophils Relative: 1 %
HEMATOCRIT: 41.8 % (ref 35.0–45.0)
HEMOGLOBIN: 13.7 g/dL (ref 11.7–15.5)
LYMPHS ABS: 1116 {cells}/uL (ref 850–3900)
Lymphocytes Relative: 31 %
MCH: 31.5 pg (ref 27.0–33.0)
MCHC: 32.8 g/dL (ref 32.0–36.0)
MCV: 96.1 fL (ref 80.0–100.0)
MONO ABS: 360 {cells}/uL (ref 200–950)
MPV: 11.9 fL (ref 7.5–12.5)
Monocytes Relative: 10 %
NEUTROS ABS: 2088 {cells}/uL (ref 1500–7800)
Neutrophils Relative %: 58 %
Platelets: 144 10*3/uL (ref 140–400)
RBC: 4.35 MIL/uL (ref 3.80–5.10)
RDW: 12.9 % (ref 11.0–15.0)
WBC: 3.6 10*3/uL — AB (ref 3.8–10.8)

## 2016-11-29 LAB — LIPID PANEL
Cholesterol: 176 mg/dL (ref <200)
HDL: 51 mg/dL (ref >50)
LDL Cholesterol: 110 mg/dL — ABNORMAL HIGH (ref <100)
TRIGLYCERIDES: 75 mg/dL (ref <150)
Total CHOL/HDL Ratio: 3.5 Ratio (ref <5.0)
VLDL: 15 mg/dL (ref <30)

## 2016-11-29 LAB — TSH: TSH: 1.71 m[IU]/L

## 2017-03-15 ENCOUNTER — Encounter: Payer: Self-pay | Admitting: Gynecology

## 2017-05-08 ENCOUNTER — Telehealth: Payer: Self-pay | Admitting: Internal Medicine

## 2017-05-08 NOTE — Telephone Encounter (Signed)
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

## 2017-05-08 NOTE — Telephone Encounter (Signed)
Pt will need routine visit w/ PCP, last routine OV was in 2015.

## 2017-05-12 NOTE — Telephone Encounter (Signed)
Patient scheduled for 05/16/2017 at 8:30am 30 minute slot with PCP, patient requesting TB.

## 2017-05-12 NOTE — Telephone Encounter (Signed)
Noted, thank you

## 2017-05-16 ENCOUNTER — Ambulatory Visit (INDEPENDENT_AMBULATORY_CARE_PROVIDER_SITE_OTHER): Payer: Managed Care, Other (non HMO) | Admitting: Internal Medicine

## 2017-05-16 ENCOUNTER — Encounter: Payer: Self-pay | Admitting: Internal Medicine

## 2017-05-16 ENCOUNTER — Telehealth: Payer: Self-pay | Admitting: Internal Medicine

## 2017-05-16 VITALS — BP 116/72 | HR 78 | Temp 98.4°F | Resp 14 | Ht 59.0 in | Wt 145.4 lb

## 2017-05-16 DIAGNOSIS — R002 Palpitations: Secondary | ICD-10-CM | POA: Diagnosis not present

## 2017-05-16 DIAGNOSIS — R7612 Nonspecific reaction to cell mediated immunity measurement of gamma interferon antigen response without active tuberculosis: Secondary | ICD-10-CM

## 2017-05-16 DIAGNOSIS — Z111 Encounter for screening for respiratory tuberculosis: Secondary | ICD-10-CM | POA: Diagnosis not present

## 2017-05-16 LAB — BASIC METABOLIC PANEL
BUN: 9 mg/dL (ref 6–23)
CALCIUM: 9.3 mg/dL (ref 8.4–10.5)
CHLORIDE: 103 meq/L (ref 96–112)
CO2: 26 meq/L (ref 19–32)
CREATININE: 0.74 mg/dL (ref 0.40–1.20)
GFR: 90.28 mL/min (ref >60.00)
Glucose, Bld: 99 mg/dL (ref 70–99)
Potassium: 3.9 mEq/L (ref 3.5–5.1)
Sodium: 137 mEq/L (ref 135–145)

## 2017-05-16 LAB — CBC WITH DIFFERENTIAL/PLATELET
Basophils Absolute: 0 10*3/uL (ref 0.0–0.1)
Basophils Relative: 0.2 % (ref 0.0–3.0)
EOS ABS: 0 10*3/uL (ref 0.0–0.7)
Eosinophils Relative: 0.4 % (ref 0.0–5.0)
HCT: 40.4 % (ref 36.0–46.0)
HEMOGLOBIN: 13.5 g/dL (ref 12.0–15.0)
LYMPHS ABS: 1.6 10*3/uL (ref 0.7–4.0)
Lymphocytes Relative: 32.7 % (ref 12.0–46.0)
MCHC: 33.4 g/dL (ref 30.0–36.0)
MCV: 95.6 fl (ref 78.0–100.0)
MONO ABS: 0.5 10*3/uL (ref 0.1–1.0)
Monocytes Relative: 9.5 % (ref 3.0–12.0)
NEUTROS PCT: 57.2 % (ref 43.0–77.0)
Neutro Abs: 2.8 10*3/uL (ref 1.4–7.7)
PLATELETS: 141 10*3/uL — AB (ref 150.0–400.0)
RBC: 4.22 Mil/uL (ref 3.87–5.11)
RDW: 12.6 % (ref 11.5–15.5)
WBC: 4.9 10*3/uL (ref 4.0–10.5)

## 2017-05-16 LAB — TSH: TSH: 2.26 u[IU]/mL (ref 0.35–4.50)

## 2017-05-16 NOTE — Progress Notes (Signed)
Subjective:    Patient ID: Rachel Ochoa, female    DOB: 21-Jan-1971, 46 y.o.   MRN: [id number]  DOS:  05/16/2017 Type of visit - description : acute Interval history: Here for Tb test. Also, reports "heart problems". The patient is extremely vague and uncertain about her symptoms, the best I can tell is that she has palpitations for more than a year described as her heart skipping, lasts few seconds, at rest, several episodes a day. Also associated nausea. Some difficulty breathing?. Lupus: Sees rheumatology, last visit note I see is from 53. Reports that recently he is experiencing aches and pains and she was told she probably is developing fibromyalgia. No documentation.   Review of Systems Denies chest pain per se. No edema No heartburn She looks apprehensive, when asked,admits that she is somewhat anxious and depressed, mostly because she has lupus  . No suicidal ideas  Past Medical History:  Diagnosis Date  . Abdominal pain    chronic sees GI IBS  . Dyslipidemia   . Elevated BP    without diagnosis of hypertension 2/11  . Epilepsy (HCC)    better by age 28  . IBS (irritable bowel syndrome)   . Leukocytopenia    thromboycytopenia, unspecified- sees hematology, rx oberservation  . Lupus (systemic lupus erythematosus) (HCC) 11-2011  . Migraine aura, persistent    saw neuro 12/10  . NSVD (normal spontaneous vaginal delivery)    X3    Past Surgical History:  Procedure Laterality Date  . BREAST SURGERY     BREAST REDUCTION  . CHOLECYSTECTOMY    . DILATION AND CURETTAGE OF UTERUS    . LAPAROSCOPIC OVARIAN CYSTECTOMY Left 02/16/2015   Procedure: LAPAROSCOPIC OVARIAN CYSTECTOMY;  Surgeon: Ok Edwards, MD;  Location: [facility];  Service: Gynecology;  Laterality: Left;  want to put this at 7:30am and have the cast currently at 7:30am moved to follow this one.  Marland Kitchen LAPAROSCOPIC SALPINGO OOPHERECTOMY Left 02/16/2015   Procedure: LAPAROSCOPIC SALPINGO OOPHORECTOMY;  Surgeon:  Ok Edwards, MD;  Location: [facility];  Service: Gynecology;  Laterality: Left;  . TONSILLECTOMY    . VAGINAL HYSTERECTOMY     TVH    Social History   Social History  . Marital status: Married    Spouse name: N/A  . Number of children: 3  . Years of education: N/A   Occupational History  . Homemaker, part time job Unemployed   Social History Main Topics  . Smoking status: Never Smoker  . Smokeless tobacco: Never Used  . Alcohol use 0.0 oz/week     Comment: rarely  . Drug use: No  . Sexual activity: Yes    Birth control/ protection: Surgical   Other Topics Concern  . Not on file   Social History Narrative   From [place]   Lives w/ husband and 3 children      Allergies as of 05/16/2017      Reactions   Latex Hives, Itching, Rash   Rash, itching, hives, has epi pen at all times   Amoxicillin    REACTION: rash and redness      Medication List       Accurate as of 05/16/17  8:17 PM. Always use your most recent med list.          hydroxychloroquine 200 MG tablet Commonly known as:  PLAQUENIL Take 200 mg by mouth daily.   ibuprofen 200 MG tablet Commonly known as:  ADVIL,MOTRIN Take 400 mg  by mouth every 6 (six) hours as needed for headache or moderate pain.   predniSONE 5 MG tablet Commonly known as:  DELTASONE Take 5 mg by mouth daily. Reported on 12/01/2015          Objective:   Physical Exam BP 116/72 (BP Location: Left Arm, Patient Position: Sitting, Cuff Size: Small)   Pulse 78   Temp 98.4 F (36.9 C) (Oral)   Resp 14   Ht 4\' 11"  (1.499 m)   Wt 145 lb 6 oz (65.9 kg)   LMP 03/01/2004   SpO2 96%   BMI 29.36 kg/m  General:   Well developed, well nourished . NAD.  HEENT:  Normocephalic . Face symmetric, atraumatic Neck: No thyromegaly Lungs:  CTA B Normal respiratory effort, no intercostal retractions, no accessory muscle use. Heart: RRR,  no murmur.  no pretibial edema bilaterally  Abdomen:  Not distended, soft,  non-tender. No rebound or rigidity.  Skin: Not pale. Not jaundice Neurologic:  alert & oriented X3.  Speech normal, gait appropriate for age and unassisted Psych--  Cognition and judgment appear intact.  Cooperative with normal attention span and concentration.  Behavior appropriate. Apprehensive appearing, at baseline. Not depressed appearing.     Assessment & Plan:   Assessment Elevated BP Dyslipidemia Systemic lupus  Leukocytopenia, thrombocytopenia. Saw hematology, Rx observation. See hem-onc if  platelets < 50K IBS Migraines, saw neurology 2010 Epilepsy (better by age 46 H/o vaginal hysterectomy  PLAN Palpitations: As described above, EKG today: Within normal Plan: CBC, TSH, BMP, Holter. Further advise with results. TB test needed. We'll do Systemic lupus, patient reports also question of fibromyalgia, unfortunately I'm not getting any rheumatology reports. Primary care issues: Reports she has a physical with gynecology.   RTC 6 months

## 2017-05-16 NOTE — Telephone Encounter (Signed)
Paperwork forwarded to PCP medical assistant [Kaylyn]; they are awaiting her lab result on Tb/SLS 07/17

## 2017-05-16 NOTE — Patient Instructions (Addendum)
  Please get your blood work.  Next visit in 6 months, please make an appointment  Will schedule a Holter monitor, to check your heart

## 2017-05-16 NOTE — Assessment & Plan Note (Signed)
Palpitations: As described above, EKG today: Within normal Plan: CBC, TSH, BMP, Holter. Further advise with results. TB test needed. We'll do Systemic lupus, patient reports also question of fibromyalgia, unfortunately I'm not getting any rheumatology reports. Primary care issues: Reports she has a physical with gynecology.   RTC 6 months

## 2017-05-16 NOTE — Progress Notes (Signed)
Pre visit review using our clinic review tool, if applicable. No additional management support is needed unless otherwise documented below in the visit note. 

## 2017-05-16 NOTE — Telephone Encounter (Signed)
Relation to ZO:XWRUpt:self Call back [phone]  Reason for call:  Patient last seen today 05/16/17 and dropped off Health Examination form, handed to RobelineSharon

## 2017-05-18 LAB — QUANTIFERON TB GOLD ASSAY (BLOOD)
Interferon Gamma Release Assay: POSITIVE — AB
Mitogen-Nil: 9.18 IU/mL
QUANTIFERON NIL VALUE: 0.15 [IU]/mL
Quantiferon Tb Ag Minus Nil Value: 0.97 IU/mL

## 2017-05-19 ENCOUNTER — Ambulatory Visit (HOSPITAL_BASED_OUTPATIENT_CLINIC_OR_DEPARTMENT_OTHER)
Admission: RE | Admit: 2017-05-19 | Discharge: 2017-05-19 | Disposition: A | Discharge status: Home / Self Care | Payer: Managed Care, Other (non HMO) | Source: Ambulatory Visit | Attending: Internal Medicine | Admitting: Internal Medicine

## 2017-05-19 ENCOUNTER — Telehealth: Payer: Self-pay

## 2017-05-19 DIAGNOSIS — R7612 Nonspecific reaction to cell mediated immunity measurement of gamma interferon antigen response without active tuberculosis: Secondary | ICD-10-CM | POA: Insufficient documentation

## 2017-05-19 NOTE — Telephone Encounter (Signed)
Pt walked into clinic today after chest x-ray.  She spoke to HolbrookJackie, Therapist, nutritionalfront office registrar to voice her concerns about positive tb gold result.  Pt stated that she wanted to know what the positive results meant for her and her family.  Nurse spoke to Dr. Drue NovelPaz regarding patient's concerns.  He advised that patient be brought back to exam room for him to explain to patient.  Pt escorted to exam room as advised.

## 2017-05-19 NOTE — Telephone Encounter (Signed)
TB Gold positive, form completed, Pt aware chest x-ray needed, form will be [facility]. Informed Pt that form has been completed at placed at front desk for pick up at her convenience.

## 2017-05-19 NOTE — Addendum Note (Signed)
Addended byConrad [place]: Eustolia Drennen D on: 05/19/2017 09:55 AM   Modules accepted: Orders

## 2017-05-19 NOTE — Telephone Encounter (Signed)
Patient concerned about the + Tb gold results, previously had negative PPDs.   Denies fever, chills, weight loss or cough. Advise patient that needs to be seen at the health department for further eval, likely will need antibiotics.  Will let them know that she has a history of lupus and take  steroids She is not a risk to her family, she was very concerned about it.

## 2017-05-29 ENCOUNTER — Ambulatory Visit (INDEPENDENT_AMBULATORY_CARE_PROVIDER_SITE_OTHER): Payer: Managed Care, Other (non HMO)

## 2017-05-29 DIAGNOSIS — R002 Palpitations: Secondary | ICD-10-CM | POA: Diagnosis not present

## 2017-06-19 ENCOUNTER — Other Ambulatory Visit: Payer: Self-pay | Admitting: Obstetrics & Gynecology

## 2017-06-19 DIAGNOSIS — Z1231 Encounter for screening mammogram for malignant neoplasm of breast: Secondary | ICD-10-CM

## 2017-08-04 ENCOUNTER — Ambulatory Visit (INDEPENDENT_AMBULATORY_CARE_PROVIDER_SITE_OTHER): Payer: Managed Care, Other (non HMO) | Admitting: Internal Medicine

## 2017-08-04 ENCOUNTER — Encounter: Payer: Self-pay | Admitting: Internal Medicine

## 2017-08-04 ENCOUNTER — Ambulatory Visit (HOSPITAL_BASED_OUTPATIENT_CLINIC_OR_DEPARTMENT_OTHER)
Admission: RE | Admit: 2017-08-04 | Discharge: 2017-08-04 | Disposition: A | Discharge status: Home / Self Care | Payer: Managed Care, Other (non HMO) | Source: Ambulatory Visit | Attending: Internal Medicine | Admitting: Internal Medicine

## 2017-08-04 VITALS — BP 108/80 | HR 94 | Temp 98.1°F | Resp 14 | Ht 59.0 in | Wt 144.0 lb

## 2017-08-04 DIAGNOSIS — J3489 Other specified disorders of nose and nasal sinuses: Secondary | ICD-10-CM | POA: Diagnosis not present

## 2017-08-04 MED ORDER — SULFAMETHOXAZOLE-TRIMETHOPRIM 800-160 MG PO TABS: 1.0000 | ORAL_TABLET | Freq: Two times a day (BID) | ORAL | 0 refills | Status: DC

## 2017-08-04 MED ORDER — AZELASTINE HCL 0.1 % NA SOLN: 2.0000 | Freq: Every evening | NASAL | 3 refills | Status: DC | PRN

## 2017-08-04 NOTE — Progress Notes (Signed)
Pre visit review using our clinic review tool, if applicable. No additional management support is needed unless otherwise documented below in the visit note. 

## 2017-08-04 NOTE — Progress Notes (Signed)
Subjective:    Patient ID: Rachel Ochoa, female    DOB: 02-05-1971, 46 y.o.   MRN: [id number]  DOS:  08/04/2017 Type of visit - description : acute Interval history: Her main complaint is nasal pain. A week ago started w/t some nasal irritation, she started OTC spray decongestant. The last 2 days noted pain and some swelling. The pain is in the upper side of the nose and some swelling on the side of the nose. "Like somebody punched me on the face", nose is tender when she touches it   Review of Systems No fever chills Mild sore throats No cough or chest congestion No recent dental pain, infection or procedure   Past Medical History:  Diagnosis Date  . Abdominal pain    chronic sees GI IBS  . Dyslipidemia   . Elevated BP    without diagnosis of hypertension 2/11  . Epilepsy (HCC)    better by age 49  . IBS (irritable bowel syndrome)   . Leukocytopenia    thromboycytopenia, unspecified- sees hematology, rx oberservation  . Lupus (systemic lupus erythematosus) (HCC) 11-2011  . Migraine aura, persistent    saw neuro 12/10  . NSVD (normal spontaneous vaginal delivery)    X3    Past Surgical History:  Procedure Laterality Date  . BREAST SURGERY     BREAST REDUCTION  . CHOLECYSTECTOMY    . DILATION AND CURETTAGE OF UTERUS    . LAPAROSCOPIC OVARIAN CYSTECTOMY Left 02/16/2015   Procedure: LAPAROSCOPIC OVARIAN CYSTECTOMY;  Surgeon: Ok Edwards, MD;  Location: [facility];  Service: Gynecology;  Laterality: Left;  want to put this at 7:30am and have the cast currently at 7:30am moved to follow this one.  Marland Kitchen LAPAROSCOPIC SALPINGO OOPHERECTOMY Left 02/16/2015   Procedure: LAPAROSCOPIC SALPINGO OOPHORECTOMY;  Surgeon: Ok Edwards, MD;  Location: [facility];  Service: Gynecology;  Laterality: Left;  . TONSILLECTOMY    . VAGINAL HYSTERECTOMY     TVH    Social History   Social History  . Marital status: Married    Spouse name: N/A  . Number of children: 3  . Years of  education: N/A   Occupational History  . Homemaker, part time job Unemployed   Social History Main Topics  . Smoking status: Never Smoker  . Smokeless tobacco: Never Used  . Alcohol use 0.0 oz/week     Comment: rarely  . Drug use: No  . Sexual activity: Yes    Birth control/ protection: Surgical   Other Topics Concern  . Not on file   Social History Narrative   From [place]   Lives w/ husband and 3 children      Allergies as of 08/04/2017      Reactions   Latex Hives, Itching, Rash   Rash, itching, hives, has epi pen at all times   Amoxicillin    REACTION: rash and redness      Medication List       Accurate as of 08/04/17  2:11 PM. Always use your most recent med list.          hydroxychloroquine 200 MG tablet Commonly known as:  PLAQUENIL Take 200 mg by mouth daily.   ibuprofen 200 MG tablet Commonly known as:  ADVIL,MOTRIN Take 400 mg by mouth every 6 (six) hours as needed for headache or moderate pain.   isoniazid 300 MG tablet Commonly known as:  NYDRAZID Take 900 mg by mouth once a week.   predniSONE  5 MG tablet Commonly known as:  DELTASONE Take 5 mg by mouth daily. Reported on 12/01/2015   PRIFTIN 150 MG Tabs Generic drug:  Rifapentine Take by mouth. Take as directed   pyridOXINE 50 MG tablet Commonly known as:  VITAMIN B-6 Take 50 mg by mouth once a week.          Objective:   Physical Exam  HENT:  Head:     BP 108/80 (BP Location: Left Arm, Patient Position: Sitting, Cuff Size: Small)   Pulse 94   Temp 98.1 F (36.7 C) (Oral)   Resp 14   Ht  (1.499 m)   Wt 144 lb (65.3 kg)   LMP 03/01/2004   SpO2 97%   BMI 29.08 kg/m  General:   Well developed, well nourished . NAD.  HEENT:  Normocephalic . Face symmetric, atraumatic. See graphic and picture. Throat symmetric, no red TMs normal Neck: No LAD is and normal range of motion Sinuses: No TTP. Nose definitely congested EOMI without pain Lungs:  CTA  B Normal respiratory effort, no intercostal retractions, no accessory muscle use. Heart: RRR,  no murmur.  No pretibial edema bilaterally  Skin: Not pale. Not jaundice Neurologic:  alert & oriented X3.  Speech normal, gait appropriate for age and unassisted Psych--  Cognition and judgment appear intact.  Cooperative with normal attention span and concentration.  Behavior appropriate. No anxious or depressed appearing.   See picture, minimal if any swelling  at the nose and mallar area     Assessment & Plan:   Assessment Elevated BP Dyslipidemia Systemic lupus  Leukocytopenia, thrombocytopenia. Saw hematology, Rx observation. See hem-onc if  platelets < 50K IBS Migraines, saw neurology 2010 Epilepsy (better by age 65) H/o vaginal hysterectomy + TB test (Tb Gold)  05/19/2017 f/u by the HD  PLAN Nasal pain: Unusual presentation , has nasal pain and area is TTP,  she has very mild  swelling of the nose . On clinical grounds there is no facial or periorbital cellulitis, no history of injury. Related to overuse of OTC nasal decongestants? Sinusitis? Recommend Flonase, Astelin, Bactrim, call if not improving. Will get a x-ray. See instructions, all discussed in Spanish. Also strongly encouraged to stop OTC decongestants  nasal sprays

## 2017-08-04 NOTE — Patient Instructions (Signed)
Go to the first floor: Get your x-ray is  Rest, fluids , tylenol  Use OTC  Flonase : 2 nasal sprays on each side of the nose in the morning until you feel better Use ASTELIN a prescribed spray : 2 nasal sprays on each side of the nose at night until you feel better   Take the antibiotic as prescribed  (bactrim)  Call if not gradually better over the next  10 days  Call anytime if the symptoms are severe Call if you have fever, chills, facial swelling, difficulty with your patient.

## 2017-08-05 NOTE — Assessment & Plan Note (Signed)
Nasal pain: Unusual presentation , has nasal pain and area is TTP,  she has very mild  swelling of the nose . On clinical grounds there is no facial or periorbital cellulitis, no history of injury. Related to overuse of OTC nasal decongestants? Sinusitis? Recommend Flonase, Astelin, Bactrim, call if not improving. Will get a x-ray. See instructions, all discussed in Spanish. Also strongly encouraged to stop OTC decongestants  nasal sprays

## 2017-11-06 ENCOUNTER — Encounter: Payer: Managed Care, Other (non HMO) | Admitting: Obstetrics & Gynecology

## 2017-11-16 ENCOUNTER — Ambulatory Visit: Payer: Managed Care, Other (non HMO) | Admitting: Internal Medicine

## 2017-11-20 ENCOUNTER — Encounter: Payer: Self-pay | Admitting: Internal Medicine

## 2017-11-20 ENCOUNTER — Ambulatory Visit (INDEPENDENT_AMBULATORY_CARE_PROVIDER_SITE_OTHER): Payer: Managed Care, Other (non HMO) | Admitting: Internal Medicine

## 2017-11-20 VITALS — BP 126/64 | HR 65 | Temp 97.5°F | Resp 14 | Ht 59.0 in | Wt 146.0 lb

## 2017-11-20 DIAGNOSIS — E7800 Pure hypercholesterolemia, unspecified: Secondary | ICD-10-CM | POA: Diagnosis not present

## 2017-11-20 DIAGNOSIS — R7612 Nonspecific reaction to cell mediated immunity measurement of gamma interferon antigen response without active tuberculosis: Secondary | ICD-10-CM | POA: Diagnosis not present

## 2017-11-20 DIAGNOSIS — M329 Systemic lupus erythematosus, unspecified: Secondary | ICD-10-CM

## 2017-11-20 DIAGNOSIS — Z0000 Encounter for general adult medical examination without abnormal findings: Secondary | ICD-10-CM | POA: Insufficient documentation

## 2017-11-20 DIAGNOSIS — S8991XD Unspecified injury of right lower leg, subsequent encounter: Secondary | ICD-10-CM | POA: Diagnosis not present

## 2017-11-20 NOTE — Progress Notes (Signed)
Pre visit review using our clinic review tool, if applicable. No additional management support is needed unless otherwise documented below in the visit note. 

## 2017-11-20 NOTE — Assessment & Plan Note (Signed)
-  Td 2014, declined a flu shot -Colon cancer screening: She actually had a colonoscopy for IBS in 2007, normal.  No polyps -:Female care per gynecology -On chronic prednisone, gynecology check her bone density tests. - Recommend a CPX here in 1 year if so desired

## 2017-11-20 NOTE — Progress Notes (Signed)
Subjective:    Patient ID: Rachel Ochoa, female    DOB: [birth year], 47 y.o.   MRN: [id number]  DOS:  11/20/2017 Type of visit - description : rov Interval history: In general feels well. Did have a fall about 3 weeks ago, she pulled her left hamstring, apparently went to urgent care, the area is a still sore and she would like me to look at it.  Overall area is less sore.  No swelling. Good compliance with antibiotics for + TB Gold.  She is done with those. Lupus: Good compliance with medication, sees rheumatology regularly.   Review of Systems Denies hip pain.   Past Medical History:  Diagnosis Date  . Abdominal pain    chronic sees GI IBS  . Dyslipidemia   . Elevated BP    without diagnosis of hypertension 2/11  . Epilepsy (HCC)    better by age 77  . IBS (irritable bowel syndrome)   . Leukocytopenia    thromboycytopenia, unspecified- sees hematology, rx oberservation  . Lupus (systemic lupus erythematosus) (HCC) 11-2011  . Migraine aura, persistent    saw neuro 12/10  . NSVD (normal spontaneous vaginal delivery)    X3    Past Surgical History:  Procedure Laterality Date  . BREAST SURGERY     BREAST REDUCTION  . CHOLECYSTECTOMY    . DILATION AND CURETTAGE OF UTERUS    . LAPAROSCOPIC OVARIAN CYSTECTOMY Left 02/16/2015   Procedure: LAPAROSCOPIC OVARIAN CYSTECTOMY;  Surgeon: Ok Edwards, MD;  Location: [facility];  Service: Gynecology;  Laterality: Left;  want to put this at 7:30am and have the cast currently at 7:30am moved to follow this one.  Marland Kitchen LAPAROSCOPIC SALPINGO OOPHERECTOMY Left 02/16/2015   Procedure: LAPAROSCOPIC SALPINGO OOPHORECTOMY;  Surgeon: Ok Edwards, MD;  Location: [facility];  Service: Gynecology;  Laterality: Left;  . TONSILLECTOMY    . VAGINAL HYSTERECTOMY     TVH    Social History   Socioeconomic History  . Marital status: Married    Spouse name: Not on file  . Number of children: 3  . Years of education: Not on file  . Highest  education level: Not on file  Social Needs  . Financial resource strain: Not on file  . Food insecurity - worry: Not on file  . Food insecurity - inability: Not on file  . Transportation needs - medical: Not on file  . Transportation needs - non-medical: Not on file  Occupational History  . Occupation: Futures trader, part time job    Employer: UNEMPLOYED  Tobacco Use  . Smoking status: Never Smoker  . Smokeless tobacco: Never Used  Substance and Sexual Activity  . Alcohol use: Yes    Alcohol/week: 0.0 oz    Comment: rarely  . Drug use: No  . Sexual activity: Yes    Birth control/protection: Surgical  Other Topics Concern  . Not on file  Social History Narrative   From [place]   Lives w/ husband and 3 children      Allergies as of 11/20/2017      Reactions   Latex Hives, Itching, Rash   Rash, itching, hives, has epi pen at all times   Amoxicillin    REACTION: rash and redness      Medication List        Accurate as of 11/20/17  2:14 PM. Always use your most recent med list.          azelastine 0.1 % nasal spray  Commonly known as:  ASTELIN Place 2 sprays into both nostrils at bedtime as needed for rhinitis. Use in each nostril as directed   hydroxychloroquine 200 MG tablet Commonly known as:  PLAQUENIL Take 200 mg by mouth daily.   ibuprofen 200 MG tablet Commonly known as:  ADVIL,MOTRIN Take 400 mg by mouth every 6 (six) hours as needed for headache or moderate pain.   isoniazid 300 MG tablet Commonly known as:  NYDRAZID Take 900 mg by mouth once a week.   predniSONE 5 MG tablet Commonly known as:  DELTASONE Take 5 mg by mouth daily. Reported on 12/01/2015   PRIFTIN 150 MG Tabs Generic drug:  Rifapentine Take by mouth. Take as directed   pyridOXINE 50 MG tablet Commonly known as:  VITAMIN B-6 Take 50 mg by mouth once a week.   sulfamethoxazole-trimethoprim 800-160 MG tablet Commonly known as:  BACTRIM DS Take 1 tablet by mouth 2 (two) times  daily.          Objective:   Physical Exam  Skin:      BP 126/64 (BP Location: Left Arm, Patient Position: Sitting, Cuff Size: Normal)   Pulse 65   Temp (!) 97.5 F (36.4 C) (Oral)   Resp 14   Ht 4\' 11"  (1.499 m)   Wt 146 lb (66.2 kg)   LMP 03/01/2004   SpO2 99%   BMI 29.49 kg/m  General:   Well developed, well nourished . NAD.  HEENT:  Normocephalic . Face symmetric, atraumatic Lungs:  CTA B Normal respiratory effort, no intercostal retractions, no accessory muscle use. Heart: RRR,  no murmur.  No pretibial edema bilaterally  Skin: Not pale. Not jaundice Neurologic:  alert & oriented X3.  Speech normal, gait appropriate for age and unassisted Psych--  Cognition and judgment appear intact.  Cooperative with normal attention span and concentration.  Behavior appropriate. No anxious or depressed appearing.      Assessment & Plan:   Assessment Elevated BP Dyslipidemia Systemic lupus  Leukocytopenia, thrombocytopenia. Saw hematology, Rx observation. See hem-onc if  platelets < 50K IBS Migraines, saw neurology 2010 . Epilepsy (better by age 47) H/o vaginal hysterectomy + TB test (Tb Gold)  05/19/2017 f/u by the HD  PLAN Elevated BP: Not on medication, BP normal Dyslipidemia, not on medication, last LDL 110. Lupus: Currently on methotrexate and a low-dose of prednisone. Left leg injury: She apparently pulled her hamstring, range of motion is normal, no hip pain, palpation with no hematoma, she does have ecchymosis.  Rx a warm compress, Tylenol as needed and give it more time.  If she is not improving she is to let me know. Preventive care reviewed  RTC 1 year.  CPX if so desired ( she sees gynecology and other MDs and gets regular labs)

## 2017-11-20 NOTE — Patient Instructions (Signed)
  GO TO THE FRONT DESK Schedule your next appointment for a physical exam in 1 year   

## 2017-11-21 NOTE — Assessment & Plan Note (Signed)
Elevated BP: Not on medication, BP normal Dyslipidemia, not on medication, last LDL 110. Lupus: Currently on methotrexate and a low-dose of prednisone. Left leg injury: She apparently pulled her hamstring, range of motion is normal, no hip pain, palpation with no hematoma, she does have ecchymosis.  Rx a warm compress, Tylenol as needed and give it more time.  If she is not improving she is to let me know. Preventive care reviewed  RTC 1 year.  CPX if so desired ( she sees gynecology and other MDs and gets regular labs)

## 2017-12-15 ENCOUNTER — Encounter: Payer: Self-pay | Admitting: Obstetrics & Gynecology

## 2017-12-15 ENCOUNTER — Ambulatory Visit (INDEPENDENT_AMBULATORY_CARE_PROVIDER_SITE_OTHER): Payer: Managed Care, Other (non HMO) | Admitting: Obstetrics & Gynecology

## 2017-12-15 VITALS — BP 124/78 | Ht 59.5 in | Wt 142.0 lb

## 2017-12-15 DIAGNOSIS — Z90710 Acquired absence of both cervix and uterus: Secondary | ICD-10-CM | POA: Diagnosis not present

## 2017-12-15 DIAGNOSIS — Z01411 Encounter for gynecological examination (general) (routine) with abnormal findings: Secondary | ICD-10-CM | POA: Diagnosis not present

## 2017-12-15 DIAGNOSIS — N6342 Unspecified lump in left breast, subareolar: Secondary | ICD-10-CM

## 2017-12-15 DIAGNOSIS — N644 Mastodynia: Secondary | ICD-10-CM

## 2017-12-15 NOTE — Progress Notes (Signed)
Rachel RushSandra M Ruud 1971-07-15 [id number]   History:    47 y.o. [id number] Married. Children doing well.  RP:  Established patient presenting for annual gyn exam   HPI: S/P Hysterectomy.  S/P LSO/Rt Salpingectomy.  No pelvic pain.  Normal vaginal secretions.  No pain with IC. Urine and bowel movements normal.  Breasts normal.  Last mammogram March 2017 was negative.  Will organize next screening mammogram now.  Lupus followed by Rheumatologist.  Health labs with Fam MD.   Past medical history,surgical history, family history and social history were all reviewed and documented in the EPIC chart.  Gynecologic History Patient's last menstrual period was 03/01/2004. Contraception: status post hysterectomy Last Pap: 2012. Results were: negative Last mammogram: 12/2015. Results were: Negative Bone Density: 10/2016 Normal Flex Sig 2007.  Per Dr. Lily PeerFernandez notes, colonoscopy in 2015 because of her IBS.  Obstetric History OB History  Gravida Para Term Preterm AB Living  4 3 3   1 3   SAB TAB Ectopic Multiple Live Births  1       3    # Outcome Date GA Lbr Len/2nd Weight Sex Delivery Anes PTL Lv  4 SAB           3 Term     M Vag-Spont  N LIV  2 Term     M Vag-Spont  N LIV  1 Term     F Vag-Spont  N LIV       ROS: A ROS was performed and pertinent positives and negatives are included in the history.  GENERAL: No fevers or chills. HEENT: No change in vision, no earache, sore throat or sinus congestion. NECK: No pain or stiffness. CARDIOVASCULAR: No chest pain or pressure. No palpitations. PULMONARY: No shortness of breath, cough or wheeze. GASTROINTESTINAL: No abdominal pain, nausea, vomiting or diarrhea, melena or bright red blood per rectum. GENITOURINARY: No urinary frequency, urgency, hesitancy or dysuria. MUSCULOSKELETAL: No joint or muscle pain, no back pain, no recent trauma. DERMATOLOGIC: No rash, no itching, no lesions. ENDOCRINE: No polyuria, polydipsia, no heat or cold intolerance. No  recent change in weight. HEMATOLOGICAL: No anemia or easy bruising or bleeding. NEUROLOGIC: No headache, seizures, numbness, tingling or weakness. PSYCHIATRIC: No depression, no loss of interest in normal activity or change in sleep pattern.     Exam:   BP 124/78   Ht 4' 11.5" (1.511 m)   Wt 142 lb (64.4 kg)   LMP 03/01/2004   BMI 28.20 kg/m   Body mass index is 28.2 kg/m.  General appearance : Well developed well nourished female. No acute distress HEENT: Eyes: no retinal hemorrhage or exudates,  Neck supple, trachea midline, no carotid bruits, no thyroidmegaly Lungs: Clear to auscultation, no rhonchi or wheezes, or rib retractions  Heart: Regular rate and rhythm, no murmurs or gallops Breast:Examined in sitting and supine position:  Right breast: No palpable masses or tenderness,  no skin retraction, no nipple inversion, no nipple discharge, no skin discoloration, no right axillary or supraclavicular lymphadenopathy.  Left breast with a tender nodule 2.5 cm, mobile, just left of nipple.  Left breast skin normal.  No Left axillary LN felt. Abdomen: no palpable masses or tenderness, no rebound or guarding Extremities: no edema or skin discoloration or tenderness  Pelvic: Vulva: Normal             Vagina: No gross lesions or discharge.  Pap reflex done.  Cervix/Uterus absent  Adnexa  Without masses or tenderness  Anus:  Normal   Assessment/Plan:  47 y.o. female for annual exam   1. Encounter for gynecological examination with abnormal finding Gynecologic exam status post hysterectomy and left salpingo-oophorectomy, right salpingectomy.  Pap reflex done on vaginal vault.  Right breast exam normal.  Left breast pain, tenderness and a 2.5 cm nodule felt.  Will send patient for a right screening mammogram and a left diagnostic mammogram with breast ultrasound.  Health labs with family physician.  Continue follow-up with rheumatology for SLE.  2. Hx of total hysterectomy  3. Pain of  left breast Left breast tenderness and pain with a 2.5 cm mobile tender nodule just left of the nipple.  Organizing a left diagnostic mammogram with left breast ultrasound.  Patient agrees with plan.  Counseling on above issues >50% x 10 minutes.  Genia Del MD, 3:59 PM 12/15/2017

## 2017-12-16 ENCOUNTER — Encounter: Payer: Self-pay | Admitting: Obstetrics & Gynecology

## 2017-12-16 NOTE — Patient Instructions (Signed)
1. Encounter for gynecological examination with abnormal finding Gynecologic exam status post hysterectomy and left salpingo-oophorectomy, right salpingectomy.  Pap reflex done on vaginal vault.  Right breast exam normal.  Left breast pain, tenderness and a 2.5 cm nodule felt.  Will send patient for a right screening mammogram and a left diagnostic mammogram with breast ultrasound.  Health labs with family physician.  Continue follow-up with rheumatology for SLE.  2. Hx of total hysterectomy  3. Pain of left breast Left breast tenderness and pain with a 2.5 cm mobile tender nodule just left of the nipple.  Organizing a left diagnostic mammogram with left breast ultrasound.  Patient agrees with plan.  [place] un placer conocerle hoy!  Voy a informarle de sus [employer].  Usted va a recibir Product manager para Psychologist, prison and probation services.  [place] (Health Maintenance, Female) Un estilo de vida saludable y los cuidados preventivos pueden favorecer considerablemente a la salud y Musician. Pregunte a su mdico cul es el cronograma de exmenes peridicos apropiado para usted. Esta es una buena oportunidad para consultarlo sobre cmo prevenir enfermedades y [place] sano. Adems de los controles, hay muchas otras cosas que puede hacer usted mismo. Los expertos han realizado numerosas investigaciones [employer] cambios en el estilo de vida y las medidas de prevencin que, [place], lo ayudarn a mantenerse sano. Solicite a su mdico ms informacin. EL PESO Y LA DIETA Consuma una dieta saludable.  Asegrese de [employer] verduras, frutas, productos lcteos de bajo contenido de [place] y Advertising account planner.  No consuma muchos alimentos de alto contenido de grasas slidas, azcares agregados o sal.  Realice actividad fsica con regularidad. Esta es una de las prcticas ms importantes que puede hacer por su salud. ? La Delorise Shiner de los adultos deben hacer  ejercicio durante al menos 194mnutos por semana. El ejercicio debe aumentar la frecuencia cardaca y pActorla transpiracin (ejercicio de [place]. ? La mayora de los adultos tambin deben hacer ejercicios de elongacin al mToysRusveces a la semana. Agregue esto al su plan de ejercicio de intensidad moderada. Mantenga un peso saludable.  El ndice de masa corporal ([facility] es una medida que puede utilizarse para identificar posibles problemas de pRobbins Proporciona una estimacin de la grasa corporal basndose en el peso y la altura. Su mdico puede ayudarle a dRadiation protection practitionerIRockledgey a lScientist, forensico mTheatre managerun peso saludable.  Para las mujeres de 20aos o ms: ? Un IMeah Asc Management LLCmenor de 18,5 se considera bajo peso. ? Un ICalhoun Memorial Hospitalentre 18,5 y 24,9 es normal. ? Un IDekalb Regional Medical Centerentre 25 y 29,9 se considera sobrepeso. ? Un IMC de 30 o ms se considera obesidad. Observe los niveles de colesterol y lpidos en la sangre.  Debe comenzar a rEnglish as a second language teacherde lpidos y cResearch officer, trade unionen la sangre a los 20aos y luego repetirlos cada 521aos  Es posible que nAutomotive engineerlos niveles de colesterol con mayor frecuencia si: ? Sus niveles de lpidos y colesterol son altos. ? Es mayor de [id number] ? Presenta un alto riesgo de padecer enfermedades cardacas. DETECCIN DE CNCER Cncer de pulmn  Se recomienda realizar exmenes de deteccin de cncer de pulmn a personas adultas entre 548y 870aos que estn en riesgo de dHorticulturist, commercialde pulmn por sus antecedentes de consumo de tabaco.  Se recomienda una tomografa computarizada de baja dosis de los pulmones todos los aos a las personas que: ? Fuman actualmente. ? Hayan dejado el  hbito en algn momento en los ltimos 15aos. ? Hayan fumado durante 30aos un paquete diario. Un paquete-ao equivale a fumar un promedio de un paquete de cigarrillos diario durante un ao.  Los exmenes de deteccin anuales deben continuar hasta que hayan pasado 15aos desde  que dej de fumar.  Ya no debern realizarse si tiene un problema de salud que le impida recibir tratamiento para Science writer de pulmn. Cncer de mama  Practique la autoconciencia de la mama. Esto significa reconocer la apariencia normal de sus mamas y cmo las siente.  Tambin significa realizar autoexmenes regulares de [employer]. Informe a su mdico sobre cualquier cambio, sin importar cun pequeo sea.  Si tiene entre 20 y 40 aos, un mdico debe realizarle un examen clnico de las mamas como parte del examen regular de [place], cada 1 a 3aos.  Si tiene 40aos o ms, debe Information systems manager clnico de las Microsoft. Tambin considere realizarse una [place] todos los [place].  Si tiene antecedentes familiares de cncer de mama, hable con su mdico para someterse a un estudio gentico.  Si tiene alto riesgo de Chief Financial Officer de mama, hable con su mdico para someterse a Public house manager y [employer].  La evaluacin del gen del cncer de mama (BRCA) se recomienda a mujeres que tengan familiares con cnceres relacionados con el BRCA. Los cnceres relacionados con el BRCA incluyen los siguientes: ? [place]. ? Ovario. ? Trompas. ? Cnceres de peritoneo.  Los resultados de la evaluacin determinarn la necesidad de asesoramiento gentico y de [place] de BRCA1 y BRCA2. Cncer de cuello del tero El mdico puede recomendarle que se haga pruebas peridicas de deteccin de cncer de los rganos de la pelvis (ovarios, tero y vagina). Estas pruebas incluyen un examen plvico, que abarca controlar si se produjeron cambios microscpicos en la superficie del cuello del tero (prueba de Papanicolaou). Pueden recomendarle que se haga estas pruebas cada 3aos, a partir de los 21aos.  A las mujeres que tienen entre 30 y 53aos, los mdicos pueden recomendarles que se sometan a exmenes plvicos y pruebas de Papanicolaou cada 64aos, o a  la prueba de Papanicolaou y el examen plvico en combinacin con estudios de deteccin del virus del papiloma humano (VPH) cada 5aos. Algunos tipos de VPH aumentan el riesgo de Chief Financial Officer de cuello del tero. La prueba para la deteccin del VPH tambin puede realizarse a mujeres de cualquier edad cuyos resultados de la prueba de Papanicolaou no sean claros.  Es posible que otros mdicos no recomienden exmenes de deteccin a mujeres no embarazadas que se consideran sujetos de bajo riesgo de Chief Financial Officer de pelvis y que no tienen sntomas. Pregntele al mdico si un examen plvico de deteccin es adecuado para usted.  Si ha recibido un tratamiento para Science writer cervical o una enfermedad que podra causar cncer, necesitar realizarse una prueba de Papanicolaou y controles durante al menos 32 aos de concluido el [place]. Si no se ha hecho el Papanicolaou con regularidad, debern volver a evaluarse los factores de riesgo (como tener un nuevo compaero sexual), para Teacher, adult education si debe realizarse los estudios nuevamente. Algunas mujeres sufren problemas mdicos que aumentan la probabilidad de Museum/gallery curator cncer de cuello del tero. En estos casos, el mdico podr [employer] se realicen controles y pruebas de Papanicolaou con ms frecuencia. Cncer colorrectal  Este tipo de cncer puede detectarse y a menudo prevenirse.  Por lo general, los estudios de  rutina se deben comenzar a Field seismologist a partir de los 45 aos y [place] 21 aos.  Sin embargo, el mdico podr aconsejarle que lo haga antes, si tiene factores de riesgo para el cncer de colon.  Tambin puede recomendarle que use un kit de prueba para Hydrologist en la materia fecal.  Es posible que se use una pequea cmara en el extremo de un tubo para examinar directamente el colon (sigmoidoscopia o colonoscopia) a fin de Hydrographic surveyor formas tempranas de cncer colorrectal.  Los exmenes de rutina generalmente comienzan a los  72aos.  El examen directo del colon se debe repetir cada 5 a 10aos hasta los 75aos. Sin embargo, es posible que se realicen exmenes con mayor frecuencia, si se detectan formas tempranas de plipos precancerosos o pequeos bultos. Cncer de piel  Revise la piel de la cabeza a los pies con regularidad.  Informe a su mdico si aparecen nuevos lunares o los que tiene se modifican, especialmente en su forma y color.  Tambin notifique al mdico si tiene un lunar que es ms grande que el tamao de una goma de lpiz.  Siempre use pantalla solar. Aplique pantalla solar de [place] y repetida a lo largo del Training and development officer.  Protjase usando mangas y [employer], un sombrero de ala ancha y gafas para el sol, siempre que se encuentre en el exterior. ENFERMEDADES CARDACAS, DIABETES E HIPERTENSIN ARTERIAL  La hipertensin arterial causa enfermedades cardacas y [place] el riesgo de ictus. La hipertensin arterial es ms probable en los siguientes casos: ? Las personas que tienen la presin arterial en el extremo del rango normal (100-139/85-89 mm Hg). ? [employer] con sobrepeso u obesidad. ? Scientist, water quality.  Si usted tiene entre 18 y 39 aos, debe medirse la presin arterial cada 3 a 5 aos. Si usted tiene 40 aos o ms, debe medirse la presin arterial [employer]. Debe medirse la presin arterial dos veces: una vez cuando est en un hospital o una clnica y la otra vez cuando est en otro sitio. Registre el promedio de [employer]. Para controlar su presin arterial cuando no est en un hospital o [place], puede usar lo siguiente: ? Jorje Guild automtica para medir la presin arterial en una farmacia. ? Un monitor para medir la presin arterial en el hogar.  Si tiene entre 52 y 56 aos, consulte a su mdico si debe tomar aspirina para prevenir el ictus.  Realcese exmenes de deteccin de la diabetes con regularidad. Esto incluye la toma de [place] de sangre  para controlar el nivel de azcar en la sangre durante el [place]. ? Si tiene un peso normal y un bajo riesgo de padecer diabetes, realcese este anlisis cada tres aos despus de los 45aos. ? Si tiene sobrepeso y un alto riesgo de padecer diabetes, considere someterse a este anlisis antes o con mayor frecuencia. PREVENCIN DE INFECCIONES HepatitisB  Si tiene un riesgo ms alto de Museum/gallery curator hepatitis B, debe someterse a un examen de deteccin de este virus. Se considera que tiene un alto riesgo de contraer hepatitis B si: ? Naci en un pas donde la hepatitis B es frecuente. Pregntele a su mdico qu pases son considerados de Public affairs consultant. ? Sus padres nacieron en un pas de alto riesgo y usted no recibi una vacuna que lo proteja contra la hepatitis B (vacuna contra la hepatitis B). ? [place]. ? [place] agujas para inyectarse drogas. ? Vive con alguien que  tiene hepatitis B. ? Ha tenido sexo con alguien que tiene hepatitis B. ? Recibe tratamiento de hemodilisis. ? Toma ciertos medicamentos para el cncer, trasplante de rganos y afecciones autoinmunitarias. Hepatitis C  Se recomienda un anlisis de [place] para: ? [employer] 1945 y 1965. ? Todas las personas que tengan un riesgo de haber contrado hepatitis C. Enfermedades de transmisin sexual (ETS).  Debe realizarse pruebas de deteccin de enfermedades de transmisin sexual (ETS), incluidas gonorrea y clamidia si: ? Es sexualmente activo y es menor de 88BVQ. ? Es mayor de 24aos, y Investment banker, operational informa que corre riesgo de tener este tipo de infecciones. ? La actividad sexual ha cambiado desde que le hicieron la ltima prueba de deteccin y tiene un riesgo mayor de Best boy clamidia o Radio broadcast assistant. Pregntele al mdico si usted tiene riesgo.  Si no tiene el VIH, pero corre riesgo de infectarse por el virus, se recomienda tomar diariamente un medicamento recetado para evitar la infeccin. Esto se conoce como  profilaxis previa a la exposicin. Se considera que est en riesgo si: ? Es [place] sexualmente y no [place] preservativos habitualmente o no conoce el estado del VIH de sus Advertising copywriter. ? Se inyecta drogas. ? Es [place] sexualmente con Ardelia Mems pareja que tiene VIH. Consulte a su mdico para saber si tiene un alto riesgo de infectarse por el VIH. Si opta por comenzar la profilaxis previa a la exposicin, primero debe realizarse anlisis de deteccin del VIH. Luego, le harn anlisis cada 34mses mientras est tomando los medicamentos para la profilaxis previa a la exposicin. [facility] Si es premenopusica y puede quedar [place] solicite a su mdico asesoramiento previo a la concepcin.  Si puede quedar embarazada, tome 400 a [id number](mcg) de cido fAnheuser-Busch  Si desea evitar el embarazo, hable con su mdico sobre el control de la natalidad (anticoncepcin). OSTEOPOROSIS Y MENOPAUSIA  La osteoporosis es una enfermedad en la que los huesos pierden los minerales y la fuerza por el avance de la edad. El resultado pueden ser fracturas graves en los hMidfield El riesgo de osteoporosis puede identificarse con uArdelia Memsprueba de densidad sea.  Si tiene 65aos o ms, o si est en riesgo de sufrir osteoporosis y fracturas, pregunte a su mdico si debe someterse a exmenes.  Consulte a su mdico si debe tomar un suplemento de calcio o de vitamina D para reducir el riesgo de osteoporosis.  La menopausia puede presentar ciertos sntomas fsicos y rGaffer  La terapia de reemplazo hormonal puede reducir algunos de estos sntomas y rGaffer Consulte a su mdico para saber si la terapia de reemplazo hormonal es conveniente para usted. INSTRUCCIONES PARA EL CUIDADO EN EL HOGAR  Realcese los estudios de rutina de la salud, dentales y de lPublic librarian  MMount Morris  No consuma ningn producto que contenga tabaco, lo que incluye cigarrillos, tabaco de mHigher education careers advisero cScience writer  Si est embarazada, no beba alcohol.  Si est amamantando, reduzca el consumo de alcohol y la frecuencia con la que consume.  Si es mujer y no est embarazada limite el consumo de alcohol a no ms de 1 medida por da. Una medida equivale a 12onzas de cerveza, 5onzas de vino o 1onzas de bebidas alcohlicas de alta graduacin.  No consuma drogas.  No comparta agujas.  Solicite ayuda a su mdico si necesita apoyo o informacin para abandonar las drogas.  Informe a su mdico si a menudo se siente  deprimido.  Notifique a su mdico si alguna vez ha sido vctima de abuso o si no se siente seguro en su hogar. Esta informacin no tiene Marine scientist el consejo del mdico. Asegrese de hacerle al mdico cualquier pregunta que tenga. Document Released: 10/06/2011 Document Revised: 11/07/2014 Document Reviewed: 07/21/2015 Elsevier Interactive Patient Education  [employer].

## 2017-12-19 ENCOUNTER — Telehealth: Payer: Self-pay | Admitting: *Deleted

## 2017-12-19 DIAGNOSIS — N63 Unspecified lump in unspecified breast: Secondary | ICD-10-CM

## 2017-12-19 DIAGNOSIS — N644 Mastodynia: Secondary | ICD-10-CM

## 2017-12-19 NOTE — Telephone Encounter (Signed)
Pt scheduled on 12/22/17 @ 9:50am at breast center, will route to claudia to relay to patient

## 2017-12-19 NOTE — Telephone Encounter (Signed)
-----   Message from Genia DelMarie-Lyne Lavoie, MD sent at 12/15/2017  4:21 PM EST ----- Regarding: Schedule Left Dx mammo/Breast US and Right Screening mammo Left breast tenderness and mobile nodule 2.5 cm on left side of nipple.

## 2017-12-20 NOTE — Telephone Encounter (Signed)
Detailed message left

## 2017-12-21 LAB — PAP IG W/ RFLX HPV ASCU

## 2017-12-22 ENCOUNTER — Ambulatory Visit
Admission: RE | Admit: 2017-12-22 | Discharge: 2017-12-22 | Disposition: A | Discharge status: Home / Self Care | Payer: Managed Care, Other (non HMO) | Source: Ambulatory Visit | Attending: Obstetrics & Gynecology | Admitting: Obstetrics & Gynecology

## 2017-12-22 ENCOUNTER — Ambulatory Visit: Payer: Managed Care, Other (non HMO)

## 2017-12-22 DIAGNOSIS — N644 Mastodynia: Secondary | ICD-10-CM

## 2017-12-22 DIAGNOSIS — N63 Unspecified lump in unspecified breast: Secondary | ICD-10-CM

## 2018-05-01 ENCOUNTER — Ambulatory Visit: Payer: Managed Care, Other (non HMO) | Admitting: Neurology

## 2018-05-08 ENCOUNTER — Emergency Department (HOSPITAL_BASED_OUTPATIENT_CLINIC_OR_DEPARTMENT_OTHER)
Admission: EM | Admit: 2018-05-08 | Discharge: 2018-05-08 | Disposition: A | Discharge status: Home / Self Care | Payer: Managed Care, Other (non HMO) | Attending: Emergency Medicine | Admitting: Emergency Medicine

## 2018-05-08 ENCOUNTER — Other Ambulatory Visit: Payer: Self-pay

## 2018-05-08 ENCOUNTER — Emergency Department (HOSPITAL_BASED_OUTPATIENT_CLINIC_OR_DEPARTMENT_OTHER): Payer: Managed Care, Other (non HMO)

## 2018-05-08 ENCOUNTER — Encounter (HOSPITAL_BASED_OUTPATIENT_CLINIC_OR_DEPARTMENT_OTHER): Payer: Self-pay | Admitting: *Deleted

## 2018-05-08 DIAGNOSIS — Z91040 Latex allergy status: Secondary | ICD-10-CM | POA: Insufficient documentation

## 2018-05-08 DIAGNOSIS — S20212A Contusion of left front wall of thorax, initial encounter: Secondary | ICD-10-CM | POA: Insufficient documentation

## 2018-05-08 DIAGNOSIS — Z79899 Other long term (current) drug therapy: Secondary | ICD-10-CM | POA: Diagnosis not present

## 2018-05-08 DIAGNOSIS — Y9389 Activity, other specified: Secondary | ICD-10-CM | POA: Diagnosis not present

## 2018-05-08 DIAGNOSIS — Y92410 Unspecified street and highway as the place of occurrence of the external cause: Secondary | ICD-10-CM | POA: Diagnosis not present

## 2018-05-08 DIAGNOSIS — Y999 Unspecified external cause status: Secondary | ICD-10-CM | POA: Diagnosis not present

## 2018-05-08 DIAGNOSIS — S299XXA Unspecified injury of thorax, initial encounter: Secondary | ICD-10-CM | POA: Diagnosis present

## 2018-05-08 MED ORDER — CYCLOBENZAPRINE HCL 10 MG PO TABS: 10.0000 mg | ORAL_TABLET | Freq: Three times a day (TID) | ORAL | 0 refills | Status: DC | PRN

## 2018-05-08 MED ORDER — NAPROXEN 500 MG PO TABS: 500.0000 mg | ORAL_TABLET | Freq: Two times a day (BID) | ORAL | 0 refills | Status: DC | PRN

## 2018-05-08 MED FILL — NAPROXEN 500 MG TABLET: 500 | 10 days supply | Qty: 20 | Fill #0

## 2018-05-08 MED FILL — CYCLOBENZAPRINE HCL 10 MG T: 10 | 5 days supply | Qty: 15 | Fill #0

## 2018-05-08 NOTE — Discharge Instructions (Signed)
Your xray does not show a rib fracture, however sometimes rib fractures are hard to see on xray. It's very important that you use the incentive spirometer hourly while awake, in order to keep your lungs fully inflated. Also perform a good hard cough every hour after the incentive spirometer use. You can consider using a LOOSELY WRAPPED ace wrap around the area, but do NOT put it on too tight where it would cause you to not be able to take a full deep breath. Take naprosyn as directed for inflammation and pain (take on a schedule 2x/day with food for the next 2-3 days, then as needed thereafter) with tylenol for breakthrough pain and flexeril for muscle relaxation. Do not drive or operate machinery with muscle relaxant use. Ice to areas of soreness for the next 24 hours and then may move to heat, no more than 20 minutes at a time every hour for each. Expect to be sore for the next few days and follow up with primary care physician for recheck of ongoing symptoms in the next 1 week. Return to ER for emergent changing or worsening of symptoms.
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
## 2018-05-08 NOTE — ED Notes (Signed)
Pt verbalizes understanding of d/c instructions and denies any further needs at this time. 

## 2018-05-16 ENCOUNTER — Encounter: Payer: Self-pay | Admitting: Internal Medicine

## 2018-05-16 ENCOUNTER — Ambulatory Visit (INDEPENDENT_AMBULATORY_CARE_PROVIDER_SITE_OTHER): Payer: Managed Care, Other (non HMO) | Admitting: Internal Medicine

## 2018-05-16 ENCOUNTER — Ambulatory Visit (HOSPITAL_BASED_OUTPATIENT_CLINIC_OR_DEPARTMENT_OTHER)
Admission: RE | Admit: 2018-05-16 | Discharge: 2018-05-16 | Disposition: A | Discharge status: Home / Self Care | Payer: Managed Care, Other (non HMO) | Source: Ambulatory Visit | Attending: Internal Medicine | Admitting: Internal Medicine

## 2018-05-16 DIAGNOSIS — S20212D Contusion of left front wall of thorax, subsequent encounter: Secondary | ICD-10-CM

## 2018-05-16 DIAGNOSIS — R0781 Pleurodynia: Secondary | ICD-10-CM | POA: Diagnosis present

## 2018-05-16 NOTE — Patient Instructions (Signed)
Get the x-ray downstairs  Continue taking naproxen as needed for pain.  Always take it with food because may cause gastritis and ulcers.  If you notice nausea, stomach pain, change in the color of stools --->  Stop the medicine and let us know  Call if you are not gradually improving over the next 2 weeks  Continue incentive spirometry
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
## 2018-05-16 NOTE — Progress Notes (Signed)
Pre visit review using our clinic review tool, if applicable. No additional management support is needed unless otherwise documented below in the visit note. 
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
## 2018-05-29 ENCOUNTER — Ambulatory Visit: Payer: Managed Care, Other (non HMO) | Admitting: Internal Medicine

## 2018-06-13 ENCOUNTER — Encounter: Payer: Self-pay | Admitting: Medical

## 2018-06-13 ENCOUNTER — Ambulatory Visit (INDEPENDENT_AMBULATORY_CARE_PROVIDER_SITE_OTHER): Payer: Managed Care, Other (non HMO) | Admitting: Medical

## 2018-06-13 VITALS — BP 128/84 | HR 87 | Temp 98.5°F | Resp 16 | Ht 59.0 in | Wt 140.8 lb

## 2018-06-13 DIAGNOSIS — M940 Chondrocostal junction syndrome [Tietze]: Secondary | ICD-10-CM

## 2018-06-13 DIAGNOSIS — M898X1 Other specified disorders of bone, shoulder: Secondary | ICD-10-CM

## 2018-06-13 DIAGNOSIS — R0781 Pleurodynia: Secondary | ICD-10-CM | POA: Diagnosis not present

## 2018-06-13 NOTE — Patient Instructions (Addendum)
Your rib pain is gradually getting better and you have some probable cartilage pain near sternum. I would recommend increase prednisone to 10 mg daily over next 14 days. Hold naprosyn. Consider not doing chiropracter care presently since you have some persisting pain. Then follow up in 2 weeks. If pain persist then try to order other imaging studies such as CT.  For scapula pain will get xray.  Follow up in 2 weeks or as needed
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
## 2018-06-27 ENCOUNTER — Ambulatory Visit (INDEPENDENT_AMBULATORY_CARE_PROVIDER_SITE_OTHER): Payer: Managed Care, Other (non HMO) | Admitting: Medical

## 2018-06-27 ENCOUNTER — Encounter: Payer: Self-pay | Admitting: Medical

## 2018-06-27 VITALS — BP 122/80 | HR 87 | Temp 98.4°F | Resp 16 | Ht 59.0 in | Wt 141.6 lb

## 2018-06-27 DIAGNOSIS — R0789 Other chest pain: Secondary | ICD-10-CM | POA: Diagnosis not present

## 2018-06-27 DIAGNOSIS — R0781 Pleurodynia: Secondary | ICD-10-CM

## 2018-06-27 NOTE — Patient Instructions (Signed)
For your persisting left lower rib pain and lower sternum/rib junction pain, I did go ahead and place CT of the chest without contrast order.  Discussed with radiology tech history behind rib pain and prior negative x-rays.  We will see if this is authorized by insurance.  [facility] situation and mechanism of injury.  When you get CT imaging would go ahead and get the left scapula x-ray I placed last time.  For the residual pain you can continue with ibuprofen.    Follow-up date to be determined after CT imaging.  If nobody calls you by 1 week regarding CT then please call me or my chart me regarding the delay.
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
## 2018-07-03 ENCOUNTER — Ambulatory Visit (HOSPITAL_BASED_OUTPATIENT_CLINIC_OR_DEPARTMENT_OTHER)
Admission: RE | Admit: 2018-07-03 | Discharge: 2018-07-03 | Disposition: A | Discharge status: Home / Self Care | Payer: Managed Care, Other (non HMO) | Source: Ambulatory Visit | Attending: Medical | Admitting: Medical

## 2018-07-03 DIAGNOSIS — R0789 Other chest pain: Secondary | ICD-10-CM | POA: Insufficient documentation

## 2018-07-03 DIAGNOSIS — R0781 Pleurodynia: Secondary | ICD-10-CM | POA: Insufficient documentation

## 2018-07-04 ENCOUNTER — Telehealth: Payer: Self-pay | Admitting: Medical

## 2018-07-04 DIAGNOSIS — R0781 Pleurodynia: Secondary | ICD-10-CM

## 2018-07-04 NOTE — Telephone Encounter (Signed)
Please see referral to sports medicine. Please get her in sooner the better. Latest by next week.

## 2018-07-16 ENCOUNTER — Ambulatory Visit (INDEPENDENT_AMBULATORY_CARE_PROVIDER_SITE_OTHER): Payer: Managed Care, Other (non HMO) | Admitting: Family Medicine

## 2018-07-16 ENCOUNTER — Encounter: Payer: Self-pay | Admitting: Family Medicine

## 2018-07-16 VITALS — BP 153/116 | HR 86 | Ht 60.0 in | Wt 138.0 lb

## 2018-07-16 DIAGNOSIS — S2242XA Multiple fractures of ribs, left side, initial encounter for closed fracture: Secondary | ICD-10-CM

## 2018-07-16 MED ORDER — METHOCARBAMOL 500 MG PO TABS: 500.0000 mg | ORAL_TABLET | Freq: Three times a day (TID) | ORAL | 1 refills | Status: DC | PRN
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
## 2018-07-16 NOTE — Patient Instructions (Signed)
Your rib fractures are healing and there's no evidence of a punctured lung. Your pain now is more related to muscle spasms in your back. Robaxin as needed for muscle spasms. Salon pas patches topically for pain. Heat as needed to back 15 minutes at a time 3-4 times a day. Consider physical therapy if not improving in 4 weeks as expected. Follow up with me in 4 weeks or as needed.

## 2018-09-14 ENCOUNTER — Telehealth: Payer: Self-pay | Admitting: *Deleted

## 2018-09-14 NOTE — Telephone Encounter (Signed)
[facility]; forwarded to Medical Records via email/scan/SLS 11/15

## 2018-09-17 ENCOUNTER — Telehealth: Payer: Self-pay

## 2018-09-17 NOTE — Telephone Encounter (Signed)
Pt scheduled 09/19/18 @ 3pm
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
## 2018-09-17 NOTE — Telephone Encounter (Signed)
Please schedule visit w/ PCP.

## 2018-09-19 ENCOUNTER — Ambulatory Visit (INDEPENDENT_AMBULATORY_CARE_PROVIDER_SITE_OTHER): Payer: Managed Care, Other (non HMO) | Admitting: Internal Medicine

## 2018-09-19 ENCOUNTER — Encounter: Payer: Self-pay | Admitting: Internal Medicine

## 2018-09-19 VITALS — BP 94/60 | HR 85 | Temp 98.0°F | Resp 16 | Ht <= 58 in | Wt 142.0 lb

## 2018-09-19 DIAGNOSIS — F419 Anxiety disorder, unspecified: Secondary | ICD-10-CM

## 2018-09-19 MED ORDER — ESCITALOPRAM OXALATE 10 MG PO TABS: 5.0000 mg | ORAL_TABLET | Freq: Every day | ORAL | 2 refills | Status: DC
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
## 2018-09-19 NOTE — Patient Instructions (Signed)
  GO TO THE FRONT DESK Schedule your next appointment for a  Check up in 2 months    TOME ESCITALOPRAM 10 MG: 1/2 TABLETA CADA NOCHE SI NO MEJORA EN 2 SEMANAS, TOME UN PASTILLA ENTERA  VEA AL PSICOLOGO
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
## 2018-11-21 ENCOUNTER — Encounter: Payer: Managed Care, Other (non HMO) | Admitting: Internal Medicine

## 2018-12-17 ENCOUNTER — Encounter: Payer: Managed Care, Other (non HMO) | Admitting: Obstetrics & Gynecology

## 2018-12-31 ENCOUNTER — Encounter: Payer: Self-pay | Admitting: Obstetrics & Gynecology

## 2018-12-31 ENCOUNTER — Ambulatory Visit (INDEPENDENT_AMBULATORY_CARE_PROVIDER_SITE_OTHER): Payer: Managed Care, Other (non HMO) | Admitting: Obstetrics & Gynecology

## 2018-12-31 VITALS — BP 116/78 | Ht 58.5 in | Wt 144.0 lb

## 2018-12-31 DIAGNOSIS — E663 Overweight: Secondary | ICD-10-CM | POA: Diagnosis not present

## 2018-12-31 DIAGNOSIS — Z01419 Encounter for gynecological examination (general) (routine) without abnormal findings: Secondary | ICD-10-CM

## 2018-12-31 DIAGNOSIS — Z90710 Acquired absence of both cervix and uterus: Secondary | ICD-10-CM

## 2018-12-31 NOTE — Progress Notes (Signed)
Rachel Ochoa 01-29-1971 [id number]   History:    48 y.o. [id number]  RP:  Established patient presenting for annual gyn exam   HPI:  S/P Total Hysterectomy.  Hot flushes/night sweats on-off.  Mild dryness vaginally with IC.  Breasts normal.  BMI 29.58.  Will do fasting labs with Fam MD.  Past medical history,surgical history, family history and social history were all reviewed and documented in the EPIC chart.  Gynecologic History Patient's last menstrual period was 03/01/2004. Contraception: status post hysterectomy Last Pap: 12/2017. Results were: Negative Last mammogram: 12/2017 . Results were: Benign Bone Density: 10/2016 Normal Colonoscopy: Never   Obstetric History OB History  Gravida Para Term Preterm AB Living  4 3 3   1 3   SAB TAB Ectopic Multiple Live Births  1       3    # Outcome Date GA Lbr Len/2nd Weight Sex Delivery Anes PTL Lv  4 SAB           3 Term     M Vag-Spont  N LIV  2 Term     M Vag-Spont  N LIV  1 Term     F Vag-Spont  N LIV     ROS: A ROS was performed and pertinent positives and negatives are included in the history.  GENERAL: No fevers or chills. HEENT: No change in vision, no earache, sore throat or sinus congestion. NECK: No pain or stiffness. CARDIOVASCULAR: No chest pain or pressure. No palpitations. PULMONARY: No shortness of breath, cough or wheeze. GASTROINTESTINAL: No abdominal pain, nausea, vomiting or diarrhea, melena or bright red blood per rectum. GENITOURINARY: No urinary frequency, urgency, hesitancy or dysuria. MUSCULOSKELETAL: No joint or muscle pain, no back pain, no recent trauma. DERMATOLOGIC: No rash, no itching, no lesions. ENDOCRINE: No polyuria, polydipsia, no heat or cold intolerance. No recent change in weight. HEMATOLOGICAL: No anemia or easy bruising or bleeding. NEUROLOGIC: No headache, seizures, numbness, tingling or weakness. PSYCHIATRIC: No depression, no loss of interest in normal activity or change in sleep pattern.     Exam:   BP 116/78   Ht 4' 10.5" (1.486 m)   Wt 144 lb (65.3 kg)   LMP 03/01/2004   BMI 29.58 kg/m   Body mass index is 29.58 kg/m.  General appearance : Well developed well nourished female. No acute distress HEENT: Eyes: no retinal hemorrhage or exudates,  Neck supple, trachea midline, no carotid bruits, no thyroidmegaly Lungs: Clear to auscultation, no rhonchi or wheezes, or rib retractions  Heart: Regular rate and rhythm, no murmurs or gallops Breast:Examined in sitting and supine position were symmetrical in appearance, no palpable masses or tenderness,  no skin retraction, no nipple inversion, no nipple discharge, no skin discoloration, no axillary or supraclavicular lymphadenopathy Abdomen: no palpable masses or tenderness, no rebound or guarding Extremities: no edema or skin discoloration or tenderness  Pelvic: Vulva: Normal             Vagina: No gross lesions or discharge  Cervix/Uterus absent  Adnexa  Without masses or tenderness  Anus: Normal   Assessment/Plan:  48 y.o. female for annual exam   1. Well female exam with routine gynecological exam Gynecologic exam status post total hysterectomy.  Pap test negative in February 2019.  Breast exam normal.  Will schedule mammogram now.  Probably perimenopausal.  Bone density normal in January 2018.  Health labs with family physician.  2. S/P total hysterectomy  3. Overweight (BMI 25.0-29.9) Recommend a  lower calorie/carb diet such as [employer].  Aerobic physical activities 5 times a week and weightlifting every 2 days.  Other orders - predniSONE (DELTASONE) 10 MG tablet; Take 10 mg by mouth daily with breakfast.  Genia Del MD, 12:26 PM 12/31/2018

## 2019-01-01 ENCOUNTER — Encounter: Payer: Managed Care, Other (non HMO) | Admitting: Obstetrics & Gynecology

## 2019-01-06 ENCOUNTER — Encounter: Payer: Self-pay | Admitting: Obstetrics & Gynecology

## 2019-01-06 NOTE — Patient Instructions (Signed)
1. Well female exam with routine gynecological exam Gynecologic exam status post total hysterectomy.  Pap test negative in February 2019.  Breast exam normal.  Will schedule mammogram now.  Probably perimenopausal.  Bone density normal in January 2018.  Health labs with family physician.  2. S/P total hysterectomy  3. Overweight (BMI 25.0-29.9) Recommend a lower calorie/carb diet such as [employer].  Aerobic physical activities 5 times a week and weightlifting every 2 days.  Other orders - predniSONE (DELTASONE) 10 MG tablet; Take 10 mg by mouth daily with breakfast.  Rachel Ochoa, it was a pleasure seeing you today!

## 2019-03-06 ENCOUNTER — Telehealth: Payer: Self-pay

## 2019-03-06 NOTE — Telephone Encounter (Signed)
Overdue for 2 month follow-up. Annice Pih- can you try to contact Pt to set up virtual visit please?

## 2019-03-08 NOTE — Telephone Encounter (Signed)
LVM in spanish for pt to schedule VOV fu appt, pt is due since 2 months.

## 2019-03-13 LAB — HEPATIC FUNCTION PANEL
ALT: 15 (ref 7–35)
AST: 19 (ref 13–35)
Alkaline Phosphatase: 44 (ref 25–125)
Bilirubin, Total: 0.4

## 2019-03-13 LAB — CBC AND DIFFERENTIAL
HCT: 42 (ref 36–46)
Hemoglobin: 13.7 (ref 12.0–16.0)
Platelets: 149 — AB (ref 150–399)
WBC: 3.6

## 2019-03-13 LAB — BASIC METABOLIC PANEL
BUN: 14 (ref 4–21)
CO2: 26 — AB (ref 13–22)
Chloride: 104 (ref 99–108)
Creatinine: 0.7 (ref 0.5–1.1)
Glucose: 85
Potassium: 4.5 (ref 3.4–5.3)
Sodium: 142 (ref 137–147)

## 2019-03-13 LAB — COMPREHENSIVE METABOLIC PANEL
Albumin: 4.5 (ref 3.5–5.0)
Calcium: 9.4 (ref 8.7–10.7)
GFR calc non Af Amer: 102
Globulin: 2.3

## 2019-04-03 ENCOUNTER — Other Ambulatory Visit: Payer: Self-pay | Admitting: Obstetrics & Gynecology

## 2019-04-03 DIAGNOSIS — Z1231 Encounter for screening mammogram for malignant neoplasm of breast: Secondary | ICD-10-CM

## 2019-04-19 ENCOUNTER — Other Ambulatory Visit: Payer: Self-pay

## 2019-04-19 ENCOUNTER — Ambulatory Visit
Admission: RE | Admit: 2019-04-19 | Discharge: 2019-04-19 | Disposition: A | Discharge status: Home / Self Care | Payer: Managed Care, Other (non HMO) | Source: Ambulatory Visit | Attending: Obstetrics & Gynecology | Admitting: Obstetrics & Gynecology

## 2019-04-19 DIAGNOSIS — Z1231 Encounter for screening mammogram for malignant neoplasm of breast: Secondary | ICD-10-CM

## 2019-05-21 ENCOUNTER — Ambulatory Visit: Payer: Managed Care, Other (non HMO)

## 2019-05-28 ENCOUNTER — Encounter: Payer: Self-pay | Admitting: Internal Medicine

## 2019-06-21 ENCOUNTER — Ambulatory Visit (INDEPENDENT_AMBULATORY_CARE_PROVIDER_SITE_OTHER): Payer: Managed Care, Other (non HMO) | Admitting: Internal Medicine

## 2019-06-21 ENCOUNTER — Other Ambulatory Visit: Payer: Self-pay

## 2019-06-21 DIAGNOSIS — M545 Low back pain, unspecified: Secondary | ICD-10-CM

## 2019-06-21 DIAGNOSIS — Z09 Encounter for follow-up examination after completed treatment for conditions other than malignant neoplasm: Secondary | ICD-10-CM
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
## 2019-06-23 NOTE — Assessment & Plan Note (Signed)
Left-sided back pain: Seen today acutely with increasing left-sided back pain after exercise, she is on chronic prednisone thus high risk for fractures.  Also is having some radiation to the left abdomen (?).  Needs to be seen in person, will see here in 3 days, call sooner if something changes. She is agreeable

## 2019-06-24 ENCOUNTER — Encounter: Payer: Self-pay | Admitting: Internal Medicine

## 2019-06-24 ENCOUNTER — Ambulatory Visit (HOSPITAL_BASED_OUTPATIENT_CLINIC_OR_DEPARTMENT_OTHER)
Admission: RE | Admit: 2019-06-24 | Discharge: 2019-06-24 | Disposition: A | Discharge status: Home / Self Care | Payer: Managed Care, Other (non HMO) | Source: Ambulatory Visit | Attending: Internal Medicine | Admitting: Internal Medicine

## 2019-06-24 ENCOUNTER — Ambulatory Visit (INDEPENDENT_AMBULATORY_CARE_PROVIDER_SITE_OTHER): Payer: Managed Care, Other (non HMO) | Admitting: Internal Medicine

## 2019-06-24 ENCOUNTER — Other Ambulatory Visit: Payer: Self-pay

## 2019-06-24 VITALS — BP 128/90 | HR 77 | Temp 97.1°F | Resp 16 | Ht 58.5 in | Wt 153.0 lb

## 2019-06-24 DIAGNOSIS — M546 Pain in thoracic spine: Secondary | ICD-10-CM

## 2019-06-24 LAB — COMPREHENSIVE METABOLIC PANEL
ALT: 16 U/L (ref 0–35)
AST: 15 U/L (ref 0–37)
Albumin: 4.4 g/dL (ref 3.5–5.2)
Alkaline Phosphatase: 38 U/L — ABNORMAL LOW (ref 39–117)
BUN: 14 mg/dL (ref 6–23)
CO2: 27 mEq/L (ref 19–32)
Calcium: 8.6 mg/dL (ref 8.4–10.5)
Chloride: 106 mEq/L (ref 96–112)
Creatinine, Ser: 0.68 mg/dL (ref 0.40–1.20)
GFR: 92.78 mL/min (ref >60.00)
Glucose, Bld: 114 mg/dL — ABNORMAL HIGH (ref 70–99)
Potassium: 4.7 mEq/L (ref 3.5–5.1)
Sodium: 138 mEq/L (ref 135–145)
Total Bilirubin: 0.3 mg/dL (ref 0.2–1.2)
Total Protein: 6.4 g/dL (ref 6.0–8.3)

## 2019-06-24 LAB — URINALYSIS, ROUTINE W REFLEX MICROSCOPIC
Bilirubin Urine: NEGATIVE
Hgb urine dipstick: NEGATIVE
Ketones, ur: NEGATIVE
Leukocytes,Ua: NEGATIVE
Nitrite: NEGATIVE
RBC / HPF: NONE SEEN (ref 0–?)
Specific Gravity, Urine: 1.025 (ref 1.000–1.030)
Total Protein, Urine: NEGATIVE
Urine Glucose: NEGATIVE
Urobilinogen, UA: 0.2 (ref 0.0–1.0)
pH: 6 (ref 5.0–8.0)

## 2019-06-24 LAB — CBC WITH DIFFERENTIAL/PLATELET
Basophils Absolute: 0 10*3/uL (ref 0.0–0.1)
Basophils Relative: 0.3 % (ref 0.0–3.0)
Eosinophils Absolute: 0 10*3/uL (ref 0.0–0.7)
Eosinophils Relative: 0.4 % (ref 0.0–5.0)
HCT: 38 % (ref 36.0–46.0)
Hemoglobin: 12.6 g/dL (ref 12.0–15.0)
Lymphocytes Relative: 27.9 % (ref 12.0–46.0)
Lymphs Abs: 1 10*3/uL (ref 0.7–4.0)
MCHC: 33.2 g/dL (ref 30.0–36.0)
MCV: 96.1 fl (ref 78.0–100.0)
Monocytes Absolute: 0.3 10*3/uL (ref 0.1–1.0)
Monocytes Relative: 7.9 % (ref 3.0–12.0)
Neutro Abs: 2.3 10*3/uL (ref 1.4–7.7)
Neutrophils Relative %: 63.5 % (ref 43.0–77.0)
Platelets: 142 10*3/uL — ABNORMAL LOW (ref 150.0–400.0)
RBC: 3.95 Mil/uL (ref 3.87–5.11)
RDW: 12.5 % (ref 11.5–15.5)
WBC: 3.7 10*3/uL — ABNORMAL LOW (ref 4.0–10.5)

## 2019-06-24 MED ORDER — CYCLOBENZAPRINE HCL 10 MG PO TABS: 10.0000 mg | ORAL_TABLET | Freq: Every evening | ORAL | 0 refills | Status: DC | PRN

## 2019-06-24 NOTE — Patient Instructions (Addendum)
GO TO THE LAB : Get the blood work     GO TO THE FRONT DESK Schedule your next appointment   for a physical exam in 4 to 5 months   STOP BY THE FIRST FLOOR:  get the XR   For pain: Take OTC Tylenol every 8 hours Heating pad Cyclobenzaprine, a muscle relaxant at nighttime We are referring you to orthopedic doctor Stop ibuprofen, it may upset your stomach. Get over-the-counter omeprazole and take 1 tablet daily before breakfast for 3 weeks

## 2019-06-24 NOTE — Progress Notes (Signed)
Subjective:    Patient ID: Rachel RushSandra M Ochoa, female    DOB: 04-27-1971, 48 y.o.   MRN: [id number]  DOS:  06/24/2019 Type of visit - description: Acute See visit from few days ago. She started a walking program few weeks ago, since then is having left mid thoracic/back pain, it radiates anteriorly. Worse with certain movements including bending. Overall the pain has been intense but it has decreased in the last few days.  She is taking ibuprofen, prescription strength, several doses.  Review of Systems Denies fever chills or rash She also reports feeling somewhat bloated. When asked admits to occasional epigastric burning but very mild. No nausea, vomiting, diarrhea, no blood in the stools.  She does have chronic constipation No dysuria, gross hematuria, difficulty urinating but some urinary frequency  Past Medical History:  Diagnosis Date  . Abdominal pain    chronic sees GI IBS  . Dyslipidemia   . Elevated BP    without diagnosis of hypertension 2/11  . Epilepsy ([facility])    better by age 89  . IBS (irritable bowel syndrome)   . Leukocytopenia    thromboycytopenia, unspecified- sees hematology, rx oberservation  . Lupus (systemic lupus erythematosus) ([facility]) 11-2011  . Migraine aura, persistent    saw neuro 12/10  . NSVD (normal spontaneous vaginal delivery)    X3    Past Surgical History:  Procedure Laterality Date  . BREAST SURGERY     BREAST REDUCTION  . CHOLECYSTECTOMY    . DILATION AND CURETTAGE OF UTERUS    . LAPAROSCOPIC OVARIAN CYSTECTOMY Left 02/16/2015   Procedure: LAPAROSCOPIC OVARIAN CYSTECTOMY;  Surgeon: Ok EdwardsJuan H Fernandez, MD;  Location: [facility];  Service: Gynecology;  Laterality: Left;  want to put this at 7:30am and have the cast currently at 7:30am moved to follow this one.  Marland Kitchen. LAPAROSCOPIC SALPINGO OOPHERECTOMY Left 02/16/2015   Procedure: LAPAROSCOPIC SALPINGO OOPHORECTOMY;  Surgeon: Ok EdwardsJuan H Fernandez, MD;  Location: [facility];  Service: Gynecology;  Laterality:  Left;  . REDUCTION MAMMAPLASTY Bilateral   . TONSILLECTOMY    . VAGINAL HYSTERECTOMY     TVH    Social History   Socioeconomic History  . Marital status: Married    Spouse name: Not on file  . Number of children: 3  . Years of education: Not on file  . Highest education level: Not on file  Occupational History  . Occupation: Futures traderHomemaker, part time job    Associate Professormployer: UNEMPLOYED  Social Needs  . Financial resource strain: Not on file  . Food insecurity    Worry: Not on file    Inability: Not on file  . Transportation needs    Medical: Not on file    Non-medical: Not on file  Tobacco Use  . Smoking status: Never Smoker  . Smokeless tobacco: Never Used  Substance and Sexual Activity  . Alcohol use: Yes    Alcohol/week: 0.0 standard drinks    Comment: rarely  . Drug use: No  . Sexual activity: Yes    Partners: Male    Birth control/protection: Surgical  Lifestyle  . Physical activity    Days per week: Not on file    Minutes per session: Not on file  . Stress: Not on file  Relationships  . Social Musicianconnections    Talks on phone: Not on file    Gets together: Not on file    Attends religious service: Not on file    Active member of club or organization: Not on  file    Attends meetings of clubs or organizations: Not on file    Relationship status: Not on file  . Intimate partner violence    Fear of current or ex partner: Not on file    Emotionally abused: Not on file    Physically abused: Not on file    Forced sexual activity: Not on file  Other Topics Concern  . Not on file  Social History Narrative   From [place]   Lives w/ husband and 3 children      Allergies as of 06/24/2019      Reactions   Latex Hives, Itching, Rash   Rash, itching, hives, has epi pen at all times   Amoxicillin    REACTION: rash and redness      Medication List       Accurate as of June 24, 2019 11:59 PM. If you have any questions, ask your nurse or doctor.         cyclobenzaprine 10 MG tablet Commonly known as: FLEXERIL Take 1 tablet (10 mg total) by mouth at bedtime as needed for muscle spasms. Started by: Kathlene November, MD   hydroxychloroquine 200 MG tablet Commonly known as: PLAQUENIL Take 200 mg by mouth daily.   predniSONE 10 MG tablet Commonly known as: DELTASONE Take 10 mg by mouth daily with breakfast.           Objective:   Physical Exam BP 128/90 (BP Location: Left Arm, Patient Position: Sitting, Cuff Size: Small)   Pulse 77   Temp (!) 97.1 F (36.2 C) (Temporal)   Resp 16   Ht 4' 10.5" (1.486 m)   Wt 153 lb (69.4 kg)   LMP 03/01/2004   SpO2 100%   BMI 31.43 kg/m  General:   Well developed, NAD, BMI noted.  HEENT:  Normocephalic . Face symmetric, atraumatic Lungs:  CTA B Normal respiratory effort, no intercostal retractions, no accessory muscle use. Heart: RRR,  no murmur.  no pretibial edema bilaterally  Abdomen:  Not distended, soft, non-tender. No rebound or rigidity.   Skin: No rash MSK: Slightly TTP at the mid thoracic spine Neurologic:  alert & oriented X3.  Speech normal, gait appropriate for age and unassisted DTR symmetric, motor symmetric Psych--  Cognition and judgment appear intact.  Cooperative with normal attention span and concentration.  Behavior appropriate. No anxious or depressed appearing.     Assessment     Assessment Elevated BP Dyslipidemia Systemic lupus  Leukocytopenia, thrombocytopenia. Saw hematology, Rx observation. See hem-onc if  platelets < 50K IBS Migraines, saw neurology 2010 . Epilepsy (better by age 64) H/o vaginal hysterectomy + TB test (Tb Gold)  05/19/2017 f/u by the HD  PLAN Mid-back pain:   As described above, history is somewhat vague, radiating anteriorly or to the side like a radiculopathy? Also ill-defined GI symptoms, related to NSAIDs?Marland Kitchen Overall I believe the pain is MSK with possible radiculopathy. Plan: T-spine x-ray, Ortho referral. Check a CMP, CBC,  UA, urine culture to evaluate for intra-abdominal processes. Avoid NSAIDs, Tylenol and Flexeril okay, prescription sent.  PPIs for 2 to 3 weeks as some of the GI symptoms could be related to NSAIDs. Patient verbalized understanding. RTC 4 months CPX

## 2019-06-24 NOTE — Progress Notes (Signed)
Pre visit review using our clinic review tool, if applicable. No additional management support is needed unless otherwise documented below in the visit note. 

## 2019-06-25 LAB — URINE CULTURE
MICRO NUMBER:: 803707
SPECIMEN QUALITY:: ADEQUATE

## 2019-06-25 NOTE — Assessment & Plan Note (Signed)
Mid-back pain:   As described above, history is somewhat vague, radiating anteriorly or to the side like a radiculopathy? Also ill-defined GI symptoms, related to NSAIDs?Marland Kitchen Overall I believe the pain is MSK with possible radiculopathy. Plan: T-spine x-ray, Ortho referral. Check a CMP, CBC, UA, urine culture to evaluate for intra-abdominal processes. Avoid NSAIDs, Tylenol and Flexeril okay, prescription sent.  PPIs for 2 to 3 weeks as some of the GI symptoms could be related to NSAIDs. Patient verbalized understanding. RTC 4 months CPX

## 2019-09-12 LAB — COMPREHENSIVE METABOLIC PANEL
Albumin: 4.8 (ref 3.5–5.0)
Calcium: 9.8 (ref 8.7–10.7)
GFR calc Af Amer: 119
GFR calc non Af Amer: 104
Globulin: 2.3

## 2019-09-12 LAB — BASIC METABOLIC PANEL
BUN: 11 (ref 4–21)
CO2: 23 — AB (ref 13–22)
Chloride: 2 — AB (ref 99–108)
Creatinine: 0.7 (ref 0.5–1.1)
Glucose: 84
Potassium: 4.8 (ref 3.4–5.3)
Sodium: 143 (ref 137–147)

## 2019-09-12 LAB — CBC AND DIFFERENTIAL
HCT: 40 (ref 36–46)
Hemoglobin: 13.2 (ref 12.0–16.0)
Platelets: 156 (ref 150–399)
WBC: 4

## 2019-09-12 LAB — VITAMIN D 25 HYDROXY (VIT D DEFICIENCY, FRACTURES): Vit D, 25-Hydroxy: 25.8

## 2019-09-12 LAB — HEPATIC FUNCTION PANEL
ALT: 16 (ref 7–35)
AST: 19 (ref 13–35)
Alkaline Phosphatase: 45 (ref 25–125)
Bilirubin, Total: 0.3

## 2019-09-12 LAB — CBC: RBC: 4.18 (ref 3.87–5.11)

## 2019-09-30 ENCOUNTER — Encounter: Payer: Self-pay | Admitting: Internal Medicine

## 2019-11-25 ENCOUNTER — Encounter: Payer: Managed Care, Other (non HMO) | Admitting: Internal Medicine

## 2020-01-01 ENCOUNTER — Ambulatory Visit (INDEPENDENT_AMBULATORY_CARE_PROVIDER_SITE_OTHER): Payer: Managed Care, Other (non HMO) | Admitting: Obstetrics & Gynecology

## 2020-01-01 ENCOUNTER — Other Ambulatory Visit: Payer: Self-pay

## 2020-01-01 ENCOUNTER — Encounter: Payer: Self-pay | Admitting: Obstetrics & Gynecology

## 2020-01-01 VITALS — BP 136/90 | Ht 58.5 in | Wt 146.6 lb

## 2020-01-01 DIAGNOSIS — E6609 Other obesity due to excess calories: Secondary | ICD-10-CM | POA: Diagnosis not present

## 2020-01-01 DIAGNOSIS — Z90710 Acquired absence of both cervix and uterus: Secondary | ICD-10-CM

## 2020-01-01 DIAGNOSIS — Z01419 Encounter for gynecological examination (general) (routine) without abnormal findings: Secondary | ICD-10-CM | POA: Diagnosis not present

## 2020-01-01 DIAGNOSIS — Z6830 Body mass index (BMI) 30.0-30.9, adult: Secondary | ICD-10-CM

## 2020-01-01 DIAGNOSIS — Z780 Asymptomatic menopausal state: Secondary | ICD-10-CM

## 2020-01-01 NOTE — Progress Notes (Signed)
Rachel Ochoa January 08, 1971 [id number]   History:    49 y.o.  [id number]  Married  RP:  Established patient presenting for annual gyn exam   HPI:  S/P Total Hysterectomy.  Hot flushes/night sweats on-off, improved x last year.  Mild dryness vaginally with IC.  Breasts normal.  BMI 30.12.  Will do fasting labs with Fam MD.  Lupus on Plaquenil.    Past medical history,surgical history, family history and social history were all reviewed and documented in the EPIC chart.  Gynecologic History Patient's last menstrual period was 03/01/2004.  Obstetric History OB History  Gravida Para Term Preterm AB Living  4 3 3   1 3   SAB TAB Ectopic Multiple Live Births  1       3    # Outcome Date GA Lbr Len/2nd Weight Sex Delivery Anes PTL Lv  4 SAB           3 Term     M Vag-Spont  N LIV  2 Term     M Vag-Spont  N LIV  1 Term     F Vag-Spont  N LIV     ROS: A ROS was performed and pertinent positives and negatives are included in the history.  GENERAL: No fevers or chills. HEENT: No change in vision, no earache, sore throat or sinus congestion. NECK: No pain or stiffness. CARDIOVASCULAR: No chest pain or pressure. No palpitations. PULMONARY: No shortness of breath, cough or wheeze. GASTROINTESTINAL: No abdominal pain, nausea, vomiting or diarrhea, melena or bright red blood per rectum. GENITOURINARY: No urinary frequency, urgency, hesitancy or dysuria. MUSCULOSKELETAL: No joint or muscle pain, no back pain, no recent trauma. DERMATOLOGIC: No rash, no itching, no lesions. ENDOCRINE: No polyuria, polydipsia, no heat or cold intolerance. No recent change in weight. HEMATOLOGICAL: No anemia or easy bruising or bleeding. NEUROLOGIC: No headache, seizures, numbness, tingling or weakness. PSYCHIATRIC: No depression, no loss of interest in normal activity or change in sleep pattern.     Exam:   BP 136/90   Ht 4' 10.5" (1.486 m)   Wt 146 lb 9.6 oz (66.5 kg)   LMP 03/01/2004   BMI 30.12 kg/m    Body mass index is 30.12 kg/m.  General appearance : Well developed well nourished female. No acute distress HEENT: Eyes: no retinal hemorrhage or exudates,  Neck supple, trachea midline, no carotid bruits, no thyroidmegaly Lungs: Clear to auscultation, no rhonchi or wheezes, or rib retractions  Heart: Regular rate and rhythm, no murmurs or gallops Breast:Examined in sitting and supine position were symmetrical in appearance, no palpable masses or tenderness,  no skin retraction, no nipple inversion, no nipple discharge, no skin discoloration, no axillary or supraclavicular lymphadenopathy Abdomen: no palpable masses or tenderness, no rebound or guarding Extremities: no edema or skin discoloration or tenderness  Pelvic: Vulva: Normal             Vagina: No gross lesions or discharge.  Pap reflex done.  Cervix/Uterus absent  Adnexa  Without masses or tenderness  Anus: Normal   Assessment/Plan:  49 y.o. female for annual exam   1. Well female exam with routine gynecological exam Gynecologic exam status post total hysterectomy.  Pap reflex done at the vaginal vault.  Breast exam normal.  Screening mammogram June 2020 was negative.  Health labs with Dr. July 2020.  Lupus on Plaquenil.  2. S/P total hysterectomy  3. Postmenopause Well on no hormone replacement therapy.  Recommend coconut oil for  dryness with intercourse.  4. Class 1 obesity due to excess calories with serious comorbidity and body mass index (BMI) of 30.0 to 30.9 in adult Recommend a lower calorie/carb diet such as [employer].  Aerobic physical activities 5 times a week and light weightlifting every 2 days.  Princess Bruins MD, 4:01 PM 01/01/2020

## 2020-01-01 NOTE — Patient Instructions (Signed)
1. Well female exam with routine gynecological exam Gynecologic exam status post total hysterectomy.  Pap reflex done at the vaginal vault.  Breast exam normal.  Screening mammogram June 2020 was negative.  Health labs with Dr. Drue Novel.  Lupus on Plaquenil.  2. S/P total hysterectomy  3. Postmenopause Well on no hormone replacement therapy.  Recommend coconut oil for dryness with intercourse.  4. Class 1 obesity due to excess calories with serious comorbidity and body mass index (BMI) of 30.0 to 30.9 in adult Recommend a lower calorie/carb diet such as [employer].  Aerobic physical activities 5 times a week and light weightlifting every 2 days.  Rachel Ochoa, it was a pleasure seeing you today!  I will inform you of your results as soon as they are available.

## 2020-01-01 NOTE — Addendum Note (Signed)
Addended by: Berna Spare A on: 01/01/2020 04:56 PM   Modules accepted: Orders

## 2020-01-02 LAB — PAP IG W/ RFLX HPV ASCU

## 2020-03-11 LAB — BASIC METABOLIC PANEL
BUN: 18 (ref 4–21)
CO2: 22 (ref 13–22)
Chloride: 104 (ref 99–108)
Creatinine: 0.9 (ref 0.5–1.1)
Glucose: 78
Potassium: 4.5 (ref 3.4–5.3)
Sodium: 139 (ref 137–147)

## 2020-03-11 LAB — COMPREHENSIVE METABOLIC PANEL
Albumin: 4.7 (ref 3.5–5.0)
Calcium: 9.4 (ref 8.7–10.7)
GFR calc Af Amer: 85
GFR calc non Af Amer: 73
Globulin: 2.1

## 2020-03-11 LAB — CBC AND DIFFERENTIAL
HCT: 41 (ref 36–46)
Hemoglobin: 13.8 (ref 12.0–16.0)
Platelets: 166 (ref 150–399)
WBC: 6.2

## 2020-03-11 LAB — HEPATIC FUNCTION PANEL
ALT: 15 (ref 7–35)
AST: 18 (ref 13–35)
Alkaline Phosphatase: 42 (ref 25–125)
Bilirubin, Total: 0.3

## 2020-03-11 LAB — CBC: RBC: 4.36 (ref 3.87–5.11)

## 2020-03-20 ENCOUNTER — Encounter: Payer: Self-pay | Admitting: Internal Medicine

## 2021-01-11 ENCOUNTER — Encounter: Payer: Self-pay | Admitting: Internal Medicine

## 2021-08-16 ENCOUNTER — Ambulatory Visit (INDEPENDENT_AMBULATORY_CARE_PROVIDER_SITE_OTHER): Payer: Self-pay | Admitting: Obstetrics and Gynecology

## 2021-08-16 ENCOUNTER — Other Ambulatory Visit: Payer: Self-pay

## 2021-08-16 ENCOUNTER — Encounter: Payer: Self-pay | Admitting: Obstetrics and Gynecology

## 2021-08-16 VITALS — BP 110/70 | HR 87

## 2021-08-16 DIAGNOSIS — R300 Dysuria: Secondary | ICD-10-CM

## 2021-08-16 DIAGNOSIS — R102 Pelvic and perineal pain: Secondary | ICD-10-CM

## 2021-08-16 DIAGNOSIS — N760 Acute vaginitis: Secondary | ICD-10-CM

## 2021-08-16 LAB — URINALYSIS, COMPLETE W/RFL CULTURE
Bacteria, UA: NONE SEEN /HPF
Bilirubin Urine: NEGATIVE
Glucose, UA: NEGATIVE
Hgb urine dipstick: NEGATIVE
Hyaline Cast: NONE SEEN /LPF
Ketones, ur: NEGATIVE
Leukocyte Esterase: NEGATIVE
Nitrites, Initial: NEGATIVE
Protein, ur: NEGATIVE
RBC / HPF: NONE SEEN /HPF (ref 0–2)
Specific Gravity, Urine: 1.01 (ref 1.001–1.035)
WBC, UA: NONE SEEN /HPF (ref 0–5)
pH: 7 (ref 5.0–8.0)

## 2021-08-16 LAB — NO CULTURE INDICATED

## 2021-08-16 LAB — WET PREP FOR TRICH, YEAST, CLUE

## 2021-08-16 NOTE — Progress Notes (Signed)
GYNECOLOGY  VISIT   HPI: 50 y.o.   Married  Hispanic  female   [phone] with Patient's last menstrual period was 03/01/2004.   here for dysuria and possible vaginal irritation. Avoiding sexual activity due to pain.   She noticed an odor, which resolved.  She has a feeling of something sharp in the vagina.  Some white discharge.  Itching present.   For the last 2 months experienced cramping.  Some bloating.  Feeling nausea a couple days ago.  No vomiting.  Diarrhea depending on what she eats.  Has IBS.   No fever.  Taking Tylenol for the last 4 days.  Helps her pain.   Had an insurance physical recently also, she has increased microalbumin/creatinine ratio was 4.02.  She is worried about this.   Hysterectomy.   Still has one ovary.   No change in sexual partner.   GYNECOLOGIC HISTORY: Patient's last menstrual period was 03/01/2004. Contraception: Hyst Menopausal hormone therapy: none Last mammogram:  04-19-19 3D/Neg/BiRads1 Last pap smear: 01-01-20 Neg, 12-18-17 Neg, 06-02-11 Neg        OB History     Gravida  4   Para  3   Term  3   Preterm      AB  1   Living  3      SAB  1   IAB      Ectopic      Multiple      Live Births  3              Patient Active Problem List   Diagnosis Date Noted   Annual physical exam 11/20/2017   Inflamed external hemorrhoid 11/03/2016   PCP NOTES >>>>>>>>>>>>>>>. 10/07/2016   Female pelvic pain 12/01/2015   Lupus (systemic lupus erythematosus) (HCC) 11/14/2011   ALLERGIC RHINITIS 12/01/2010   DIZZINESS 11/23/2010   FATIGUE 11/23/2010   Chronic headaches 08/31/2009   OBESITY 01/26/2009   High cholesterol 01/24/2008   THROMBOCYTOPENIA 01/24/2008   LEUKOCYTOPENIA UNSPECIFIED 01/24/2008   IBS 03/08/2007    Past Medical History:  Diagnosis Date   Abdominal pain    chronic sees GI IBS   Dyslipidemia    Elevated BP    without diagnosis of hypertension 2/11   Epilepsy (HCC)    better by age 49   IBS  (irritable bowel syndrome)    Leukocytopenia    thromboycytopenia, unspecified- sees hematology, rx oberservation   Lupus (systemic lupus erythematosus) (HCC) 11-2011   Migraine aura, persistent    saw neuro 12/10   NSVD (normal spontaneous vaginal delivery)    X3    Past Surgical History:  Procedure Laterality Date   BREAST SURGERY     BREAST REDUCTION   CHOLECYSTECTOMY     DILATION AND CURETTAGE OF UTERUS     LAPAROSCOPIC OVARIAN CYSTECTOMY Left 02/16/2015   Procedure: LAPAROSCOPIC OVARIAN CYSTECTOMY;  Surgeon: Ok Edwards, MD;  Location: [facility];  Service: Gynecology;  Laterality: Left;  want to put this at 7:30am and have the cast currently at 7:30am moved to follow this one.   LAPAROSCOPIC SALPINGO OOPHERECTOMY Left 02/16/2015   Procedure: LAPAROSCOPIC SALPINGO OOPHORECTOMY;  Surgeon: Ok Edwards, MD;  Location: [facility];  Service: Gynecology;  Laterality: Left;   REDUCTION MAMMAPLASTY Bilateral    TONSILLECTOMY     VAGINAL HYSTERECTOMY     TVH    Current Outpatient Medications  Medication Sig Dispense Refill   hydroxychloroquine (PLAQUENIL) 200 MG tablet Take 200 mg by mouth  daily.      No current facility-administered medications for this visit.     ALLERGIES: Latex and Amoxicillin  Family History  Problem Relation Age of Onset   Breast cancer Maternal Aunt    Diabetes Father    Hypertension Father    Breast cancer Maternal Aunt     Social History   Socioeconomic History   Marital status: Married    Spouse name: Not on file   Number of children: 3   Years of education: Not on file   Highest education level: Not on file  Occupational History   Occupation: Futures trader, part time job    Employer: UNEMPLOYED  Tobacco Use   Smoking status: Never   Smokeless tobacco: Never  Vaping Use   Vaping Use: Never used  Substance and Sexual Activity   Alcohol use: Yes    Alcohol/week: 0.0 standard drinks    Comment: rarely   Drug use: No   Sexual activity: Yes     Partners: Male    Birth control/protection: Surgical  Other Topics Concern   Not on file  Social History Narrative   From [place]   Lives w/ husband and 3 children   Social Determinants of Health   Financial Resource Strain: Not on file  Food Insecurity: Not on file  Transportation Needs: Not on file  Physical Activity: Not on file  Stress: Not on file  Social Connections: Not on file  Intimate Partner Violence: Not on file    Review of Systems  Genitourinary:  Positive for dysuria and vaginal pain (vaginal irritation).  All other systems reviewed and are negative.  PHYSICAL EXAMINATION:    BP 110/70   Pulse 87   LMP 03/01/2004   SpO2 99%     General appearance: alert, cooperative and appears stated age  Abdomen: soft, non-tender, no masses,  no organomegaly No abnormal inguinal nodes palpated  Pelvic: External genitalia:  no lesions              Urethra:  normal appearing urethra with no masses, tenderness or lesions              Bartholins and Skenes: normal                 Vagina: normal appearing vagina with normal color and discharge, no lesions              Cervix: absent                Bimanual Exam:  Uterus:  absent.                Adnexa: no mass, fullness, tenderness on right.  Tenderness and no mass on left.               Rectal exam: yes.  Confirms.              Anus:  normal sphincter tone, no lesions  Chaperone was present for exam:  Marchelle Folks, CMA  ASSESSMENT  Pelvic pain.  Left lower quadrant tenderness.  Status post TVH. Status post LSO.  Vaginitis.   Dysuria.  Normal urinalysis.  IBS.  PLAN  Urinalysis: negative.  Wet prep:  negative for yeast, clue cells, and trichomonas.  She will return for pelvic ultrasound to check her right ovary. She will follow up with her PCP also as GYN origin of her pain is less likely.   An After Visit Summary was printed and given to the patient.  23 min  total time was spent for this patient  encounter, including preparation, face-to-face counseling with the patient, coordination of care, and documentation of the encounter.

## 2021-08-19 ENCOUNTER — Ambulatory Visit (INDEPENDENT_AMBULATORY_CARE_PROVIDER_SITE_OTHER): Payer: Self-pay

## 2021-08-19 ENCOUNTER — Other Ambulatory Visit: Payer: Self-pay

## 2021-08-19 ENCOUNTER — Encounter: Payer: Self-pay | Admitting: Obstetrics and Gynecology

## 2021-08-19 ENCOUNTER — Ambulatory Visit (INDEPENDENT_AMBULATORY_CARE_PROVIDER_SITE_OTHER): Payer: Self-pay | Admitting: Obstetrics and Gynecology

## 2021-08-19 VITALS — BP 110/80 | HR 88 | Resp 12 | Ht 58.5 in | Wt 143.0 lb

## 2021-08-19 DIAGNOSIS — R102 Pelvic and perineal pain: Secondary | ICD-10-CM

## 2021-08-19 NOTE — Progress Notes (Signed)
GYNECOLOGY  VISIT   HPI: 50 y.o.   Married  Hispanic  female   [phone] with Patient's last menstrual period was 03/01/2004.   here for pelvic ultrasound for pelvic pain.   Patient seen 08/16/21 for pelvic pain, cramping and bloating of 2 month duration.   She has had a hysterectomy and has one remaining ovary.   She had tenderness upon palpation of her left adnexal region with her exam on 08/16/21.  She has a negative urinalysis and wet prep on the same day.  Hx IBS and diarrhea.   GYNECOLOGIC HISTORY: Patient's last menstrual period was 03/01/2004. Contraception:  Hyst Menopausal hormone therapy:  none Last mammogram:  04-19-19 3D/Neg/BiRads1 Last pap smear:  01-01-20 Neg, 12-18-17 Neg, 06-02-11 Neg        OB History     Gravida  4   Para  3   Term  3   Preterm      AB  1   Living  3      SAB  1   IAB      Ectopic      Multiple      Live Births  3              Patient Active Problem List   Diagnosis Date Noted   Annual physical exam 11/20/2017   Inflamed external hemorrhoid 11/03/2016   PCP NOTES >>>>>>>>>>>>>>>. 10/07/2016   Female pelvic pain 12/01/2015   Lupus (systemic lupus erythematosus) (HCC) 11/14/2011   ALLERGIC RHINITIS 12/01/2010   DIZZINESS 11/23/2010   FATIGUE 11/23/2010   Chronic headaches 08/31/2009   OBESITY 01/26/2009   High cholesterol 01/24/2008   THROMBOCYTOPENIA 01/24/2008   LEUKOCYTOPENIA UNSPECIFIED 01/24/2008   IBS 03/08/2007    Past Medical History:  Diagnosis Date   Abdominal pain    chronic sees GI IBS   Dyslipidemia    Elevated BP    without diagnosis of hypertension 2/11   Epilepsy (HCC)    better by age 20   IBS (irritable bowel syndrome)    Leukocytopenia    thromboycytopenia, unspecified- sees hematology, rx oberservation   Lupus (systemic lupus erythematosus) (HCC) 11-2011   Migraine aura, persistent    saw neuro 12/10   NSVD (normal spontaneous vaginal delivery)    X3    Past Surgical History:   Procedure Laterality Date   BREAST SURGERY     BREAST REDUCTION   CHOLECYSTECTOMY     DILATION AND CURETTAGE OF UTERUS     LAPAROSCOPIC OVARIAN CYSTECTOMY Left 02/16/2015   Procedure: LAPAROSCOPIC OVARIAN CYSTECTOMY;  Surgeon: Ok Edwards, MD;  Location: [facility];  Service: Gynecology;  Laterality: Left;  want to put this at 7:30am and have the cast currently at 7:30am moved to follow this one.   LAPAROSCOPIC SALPINGO OOPHERECTOMY Left 02/16/2015   Procedure: LAPAROSCOPIC SALPINGO OOPHORECTOMY;  Surgeon: Ok Edwards, MD;  Location: [facility];  Service: Gynecology;  Laterality: Left;   REDUCTION MAMMAPLASTY Bilateral    TONSILLECTOMY     VAGINAL HYSTERECTOMY     TVH    Current Outpatient Medications  Medication Sig Dispense Refill   hydroxychloroquine (PLAQUENIL) 200 MG tablet Take 200 mg by mouth daily.      No current facility-administered medications for this visit.     ALLERGIES: Latex and Amoxicillin  Family History  Problem Relation Age of Onset   Breast cancer Maternal Aunt    Diabetes Father    Hypertension Father    Breast cancer Maternal  Aunt     Social History   Socioeconomic History   Marital status: Married    Spouse name: Not on file   Number of children: 3   Years of education: Not on file   Highest education level: Not on file  Occupational History   Occupation: Futures trader, part time job    Employer: UNEMPLOYED  Tobacco Use   Smoking status: Never   Smokeless tobacco: Never  Vaping Use   Vaping Use: Never used  Substance and Sexual Activity   Alcohol use: Yes    Alcohol/week: 0.0 standard drinks    Comment: rarely   Drug use: No   Sexual activity: Yes    Partners: Male    Birth control/protection: Surgical  Other Topics Concern   Not on file  Social History Narrative   From [place]   Lives w/ husband and 3 children   Social Determinants of Health   Financial Resource Strain: Not on file  Food Insecurity: Not on file   Transportation Needs: Not on file  Physical Activity: Not on file  Stress: Not on file  Social Connections: Not on file  Intimate Partner Violence: Not on file    Review of Systems  Genitourinary:  Positive for pelvic pain.  All other systems reviewed and are negative.  PHYSICAL EXAMINATION:    BP 110/80 (BP Location: Right Arm, Patient Position: Sitting, Cuff Size: Normal)   Pulse 88   Resp 12   Ht 4' 10.5" (1.486 m)   Wt 143 lb (64.9 kg)   LMP 03/01/2004   BMI 29.38 kg/m     General appearance: alert, cooperative and appears stated age   Pelvic US Uterus is absent.  Vaginal cuff normal.  Right ovary 2.5 x 1.71 x 1.24 cm.  Normal follicle pattern.  Normal perfusion.  No adnexal masses.  No free fluid.   ASSESSMENT  Pelvic pain.   Status post TVH.  Adenomyosis.  Status post LSO for torsion of corpus luteum cyst.  Normal right ovary on pelvic ultrasound.  IBS. Migraine aura.   PLAN  Pelvic US images and report reviewed.  Reassurance regarding ovarian appearance. We discussed the limitations of ultrasound to detect fibrosis and endometriosis.  I recommend she follow up with her PCP.  If per pain persists, she will return to the office here.  I did discuss birth control to discuss pelvic pain.  This would need to be progesterone based because of migraine aura.  Surgical care is also an option if pain persists.   An After Visit Summary was printed and given to the patient.  19 min  total time was spent for this patient encounter, including preparation, face-to-face counseling with the patient, coordination of care, and documentation of the encounter.

## 2021-12-31 ENCOUNTER — Encounter: Payer: Self-pay | Admitting: Internal Medicine

## 2022-01-03 ENCOUNTER — Other Ambulatory Visit: Payer: Self-pay | Admitting: Internal Medicine

## 2022-01-03 ENCOUNTER — Other Ambulatory Visit: Payer: Self-pay | Admitting: Obstetrics & Gynecology

## 2022-01-03 DIAGNOSIS — Z1231 Encounter for screening mammogram for malignant neoplasm of breast: Secondary | ICD-10-CM

## 2022-01-06 ENCOUNTER — Ambulatory Visit: Payer: Managed Care, Other (non HMO)

## 2022-01-10 ENCOUNTER — Other Ambulatory Visit: Payer: Self-pay

## 2022-01-10 ENCOUNTER — Ambulatory Visit
Admission: RE | Admit: 2022-01-10 | Discharge: 2022-01-10 | Disposition: A | Discharge status: Home / Self Care | Payer: BC Managed Care – PPO | Source: Ambulatory Visit | Attending: Obstetrics & Gynecology | Admitting: Obstetrics & Gynecology

## 2022-01-10 DIAGNOSIS — Z1231 Encounter for screening mammogram for malignant neoplasm of breast: Secondary | ICD-10-CM

## 2022-01-13 ENCOUNTER — Encounter: Payer: Managed Care, Other (non HMO) | Admitting: Internal Medicine

## 2022-01-24 DIAGNOSIS — E559 Vitamin D deficiency, unspecified: Secondary | ICD-10-CM | POA: Insufficient documentation

## 2022-01-25 ENCOUNTER — Ambulatory Visit (INDEPENDENT_AMBULATORY_CARE_PROVIDER_SITE_OTHER): Payer: BC Managed Care – PPO | Admitting: Internal Medicine

## 2022-01-25 ENCOUNTER — Encounter: Payer: Self-pay | Admitting: Internal Medicine

## 2022-01-25 VITALS — BP 118/82 | HR 79 | Temp 97.8°F | Resp 16 | Ht 58.5 in | Wt 151.1 lb

## 2022-01-25 DIAGNOSIS — E559 Vitamin D deficiency, unspecified: Secondary | ICD-10-CM

## 2022-01-25 DIAGNOSIS — F419 Anxiety disorder, unspecified: Secondary | ICD-10-CM | POA: Diagnosis not present

## 2022-01-25 DIAGNOSIS — R5383 Other fatigue: Secondary | ICD-10-CM | POA: Diagnosis not present

## 2022-01-25 DIAGNOSIS — E7800 Pure hypercholesterolemia, unspecified: Secondary | ICD-10-CM | POA: Diagnosis not present

## 2022-01-25 DIAGNOSIS — Z0000 Encounter for general adult medical examination without abnormal findings: Secondary | ICD-10-CM | POA: Diagnosis not present

## 2022-01-25 DIAGNOSIS — M329 Systemic lupus erythematosus, unspecified: Secondary | ICD-10-CM

## 2022-01-25 DIAGNOSIS — F32A Depression, unspecified: Secondary | ICD-10-CM

## 2022-01-25 DIAGNOSIS — Z0001 Encounter for general adult medical examination with abnormal findings: Secondary | ICD-10-CM

## 2022-01-25 LAB — CBC WITH DIFFERENTIAL/PLATELET
Basophils Absolute: 0 10*3/uL (ref 0.0–0.1)
Basophils Relative: 0.2 % (ref 0.0–3.0)
Eosinophils Absolute: 0 10*3/uL (ref 0.0–0.7)
Eosinophils Relative: 0.4 % (ref 0.0–5.0)
HCT: 40.3 % (ref 36.0–46.0)
Hemoglobin: 13.3 g/dL (ref 12.0–15.0)
Lymphocytes Relative: 30.3 % (ref 12.0–46.0)
Lymphs Abs: 1.3 10*3/uL (ref 0.7–4.0)
MCHC: 33 g/dL (ref 30.0–36.0)
MCV: 96.3 fl (ref 78.0–100.0)
Monocytes Absolute: 0.4 10*3/uL (ref 0.1–1.0)
Monocytes Relative: 8.5 % (ref 3.0–12.0)
Neutro Abs: 2.5 10*3/uL (ref 1.4–7.7)
Neutrophils Relative %: 60.6 % (ref 43.0–77.0)
Platelets: 138 10*3/uL — ABNORMAL LOW (ref 150.0–400.0)
RBC: 4.18 Mil/uL (ref 3.87–5.11)
RDW: 12.8 % (ref 11.5–15.5)
WBC: 4.2 10*3/uL (ref 4.0–10.5)

## 2022-01-25 LAB — COMPREHENSIVE METABOLIC PANEL
ALT: 16 U/L (ref 0–35)
AST: 17 U/L (ref 0–37)
Albumin: 4.4 g/dL (ref 3.5–5.2)
Alkaline Phosphatase: 35 U/L — ABNORMAL LOW (ref 39–117)
BUN: 19 mg/dL (ref 6–23)
CO2: 29 mEq/L (ref 19–32)
Calcium: 9.4 mg/dL (ref 8.4–10.5)
Chloride: 104 mEq/L (ref 96–112)
Creatinine, Ser: 0.72 mg/dL (ref 0.40–1.20)
GFR: 98.12 mL/min (ref >60.00)
Glucose, Bld: 99 mg/dL (ref 70–99)
Potassium: 4.7 mEq/L (ref 3.5–5.1)
Sodium: 139 mEq/L (ref 135–145)
Total Bilirubin: 0.4 mg/dL (ref 0.2–1.2)
Total Protein: 6.7 g/dL (ref 6.0–8.3)

## 2022-01-25 LAB — HEMOGLOBIN A1C: Hgb A1c MFr Bld: 5.5 % (ref 4.6–6.5)

## 2022-01-25 LAB — LIPID PANEL
Cholesterol: 193 mg/dL (ref 0–200)
HDL: 57.1 mg/dL (ref >39.00)
LDL Cholesterol: 113 mg/dL — ABNORMAL HIGH (ref 0–99)
NonHDL: 135.91
Total CHOL/HDL Ratio: 3
Triglycerides: 113 mg/dL (ref 0.0–149.0)
VLDL: 22.6 mg/dL (ref 0.0–40.0)

## 2022-01-25 LAB — TSH: TSH: 1.69 u[IU]/mL (ref 0.35–5.50)

## 2022-01-25 LAB — VITAMIN D 25 HYDROXY (VIT D DEFICIENCY, FRACTURES): VITD: 17.61 ng/mL — ABNORMAL LOW (ref 30.00–100.00)

## 2022-01-25 MED ORDER — FLUOXETINE HCL 10 MG PO CAPS: ORAL_CAPSULE | ORAL | 2 refills | Status: DC

## 2022-01-25 NOTE — Assessment & Plan Note (Signed)
?-  Td 2014 ?- Declined all vaccines, benefits d/w pt   ?-CCS:  she actually had a colonoscopy for IBS in 2007, normal.  No polyps.  Reassess CCS next year ?-Female care per gynecology, [facility] 12/2021 ([place]) ?- DEXA: 2018 @ gyn.  Recommend vitamin D daily ?- labs CMP, FLP, CBC, A1c, TSH, vitamin D ?

## 2022-01-25 NOTE — Patient Instructions (Signed)
Start fluoxetine 10 mg capsules: ?1 tablet daily x10  days, then 2 tablets daily. ? ?Seek counseling ? ?GO TO THE LAB : Get the blood work   ? ? ?[place], [place] ?Come back for a checkup in 6 weeks ?

## 2022-01-25 NOTE — Progress Notes (Signed)
? ?Subjective:  ? ? Patient ID: Rachel Ochoa, female    DOB: 1971-07-02, 51 y.o.   MRN: [id number] ? ?DOS:  01/25/2022 ?Type of visit - description: cpx ? ?Here for CPX.  Last seen 06/24/2019 ? ? ?In addition to CPX we addressed the following problems. ?Has not been feeling well emotionally for the last 2 to 3 years.  Trigger has been a mix of things including the sudden loss of her father a couple of years ago, her husband lost his job, her 3 children still at home. ?Admits to feeling down, denies suicidal ideas ?She is somewhat withdraw, find no joy. ?Admits she is not eating well and not exercising much. ?+ Weight gain lately, denies chest pain, palpitations or edema but when she tries to get active she gets a slightly DOE. ?Occasional snoring.  + Fatigue, occasionally sleeping ? ?Review of Systems ? ?Other than above, a 14 point review of systems is negative  ? ?  ? ? ?Past Medical History:  ?Diagnosis Date  ? Abdominal pain   ? chronic sees GI IBS  ? Dyslipidemia   ? Elevated BP   ? without diagnosis of hypertension 2/11  ? Epilepsy ([facility])   ? better by age 55  ? IBS (irritable bowel syndrome)   ? Leukocytopenia   ? thromboycytopenia, unspecified- sees hematology, rx oberservation  ? Lupus (systemic lupus erythematosus) ([facility]) 11-2011  ? Migraine aura, persistent   ? saw neuro 12/10  ? NSVD (normal spontaneous vaginal delivery)   ? X3  ? ? ?Past Surgical History:  ?Procedure Laterality Date  ? BREAST SURGERY    ? BREAST REDUCTION  ? CHOLECYSTECTOMY    ? DILATION AND CURETTAGE OF UTERUS    ? LAPAROSCOPIC OVARIAN CYSTECTOMY Left 02/16/2015  ? Procedure: LAPAROSCOPIC OVARIAN CYSTECTOMY;  Surgeon: Terrance Mass, MD;  Location: [facility];  Service: Gynecology;  Laterality: Left;  want to put this at 7:30am and have the cast currently at 7:30am moved to follow this one.  ? LAPAROSCOPIC SALPINGO OOPHERECTOMY Left 02/16/2015  ? Procedure: LAPAROSCOPIC SALPINGO OOPHORECTOMY;  Surgeon: Terrance Mass, MD;  Location: [facility];  Service: Gynecology;  Laterality: Left;  ? REDUCTION MAMMAPLASTY Bilateral   ? 1997  ? TONSILLECTOMY    ? VAGINAL HYSTERECTOMY    ? TVH  ? ?Social History  ? ?Socioeconomic History  ? Marital status: Married  ?  Spouse name: Not on file  ? Number of children: 3  ? Years of education: Not on file  ? Highest education level: Not on file  ?Occupational History  ? 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  Employer: UNEMPLOYED  ?Tobacco Use  ? Smoking status: Never  ? Smokeless tobacco: Never  ?Vaping Use  ? Vaping Use: Never used  ?Substance and Sexual Activity  ? Alcohol use: Yes  ?  Alcohol/week: 0.0 standard drinks  ?  Comment: rarely  ? Drug use: No  ? Sexual activity: Yes  ?  Partners: Male  ?  Birth control/protection: Surgical  ?Other Topics Concern  ? Not on file  ?Social History Narrative  ? From [place]  ? Lives w/ husband and 3 children  ? ?Social Determinants of Health  ? ?Financial Resource Strain: Not on file  ?Food Insecurity: Not on file  ?Transportation Needs: Not on file  ?Physical Activity: Not on file  ?Stress: Not on file  ?Social Connections: Not on file  ?Intimate Partner Violence: Not on file  ? ? ?  Current Outpatient Medications  ?Medication Instructions  ? FLUoxetine (PROZAC) 10 MG capsule Take 1 capsule by mouth daily for 10 days, then increase to 2 capsules daily  ? hydroxychloroquine (PLAQUENIL) 200 mg, Oral, Daily  ? ? ?   ?Objective:  ? Physical Exam ?BP 118/82 (BP Location: Left Arm, Patient Position: Sitting, Cuff Size: Small)   Pulse 79   Temp 97.8 ?F (36.6 ?C) (Oral)   Resp 16   Ht 4' 10.5" (1.486 m)   Wt 151 lb 2 oz (68.5 kg)   LMP 03/01/2004   SpO2 98%   BMI 31.05 kg/m?  ?General: ?Well developed, NAD, BMI noted ?Neck: No  thyromegaly  ?HEENT:  ?Normocephalic . Face symmetric, atraumatic ?Lungs:  ?CTA B ?Normal respiratory effort, no intercostal retractions, no accessory muscle use. ?Heart: RRR,  no murmur.  ?Abdomen:  ?Not distended, soft,  non-tender. No rebound or rigidity.   ?Lower extremities: no pretibial edema bilaterally  ?Skin: Exposed areas without rash. Not pale. Not jaundice ?Neurologic:  ?alert & oriented X3.  ?Speech normal, gait appropriate for age and unassisted ?Strength symmetric and appropriate for age.  ?Psych: ?Cognition and judgment appear intact.  ?Cooperative with normal attention span and concentration.  ?Behavior appropriate. ?No anxious or depressed appearing. ? ?   ?Assessment   ? ?  ?Assessment ?Elevated BP ?Dyslipidemia ?Systemic lupus  ?Leukocytopenia, thrombocytopenia. Saw hematology, Rx observation. See hem-onc if  platelets < 50K ?IBS ?Migraines, saw neurology 2010 . ?Epilepsy (better by age 27) ?H/o vaginal hysterectomy ?+ TB test ([facility])  05/19/2017 f/u by the HD ?  ?PLAN ?Here for CPX ?Dyslipidemia: Diet controlled, checking labs ?Lupus: On methotrexate, sees rheumatology ?Anxiety depression: Symptoms as described above, going on for 2 to 3 years, consistent with anxiety depression, no suicidal ideas, PHQ-9 12.  She has not been treated at all. ?Recommend: Daily exercise, medication, counseling. ?Start fluoxetine 10 mg x 10 days then 20 mg daily.  Side effects discussed.  She also has other symptoms including weight gain, fatigue, sometimes feeling sleepy, DOE (likely deconditioning).  Reassess after depression is treated. ?RTC 6 weeks ? ? ?In addition to CPX, we had a long discussion about depression and anxiety ?  ?This visit occurred during the SARS-CoV-2 public health emergency.  Safety protocols were in place, including screening questions prior to the visit, additional usage of staff PPE, and extensive cleaning of exam room while observing appropriate contact time as indicated for disinfecting solutions.  ? ?

## 2022-01-25 NOTE — Assessment & Plan Note (Signed)
Here for CPX ?Dyslipidemia: Diet controlled, checking labs ?Lupus: On methotrexate, sees rheumatology ?Anxiety depression: Symptoms as described above, going on for 2 to 3 years, consistent with anxiety depression, no suicidal ideas, PHQ-9 12.  She has not been treated at all. ?Recommend: Daily exercise, medication, counseling. ?Start fluoxetine 10 mg x 10 days then 20 mg daily.  Side effects discussed.  She also has other symptoms including weight gain, fatigue, sometimes feeling sleepy, DOE (likely deconditioning).  Reassess after depression is treated. ?RTC 6 weeks ?

## 2022-01-26 MED ORDER — VITAMIN D (ERGOCALCIFEROL) 1.25 MG (50000 UNIT) PO CAPS
50000.0000 [IU] | ORAL_CAPSULE | ORAL | 0 refills | Status: DC
Start: 2022-01-26 — End: 2022-07-05

## 2022-01-26 NOTE — Addendum Note (Signed)
Addended byDamita Dunnings D on: 01/26/2022 10:29 AM ? ? Modules accepted: Orders ? ?

## 2022-03-08 ENCOUNTER — Ambulatory Visit: Payer: BC Managed Care – PPO | Admitting: Internal Medicine

## 2022-03-21 ENCOUNTER — Encounter: Payer: Self-pay | Admitting: Internal Medicine

## 2022-06-27 ENCOUNTER — Encounter: Payer: Self-pay | Admitting: Internal Medicine

## 2022-07-05 ENCOUNTER — Ambulatory Visit (INDEPENDENT_AMBULATORY_CARE_PROVIDER_SITE_OTHER): Payer: BC Managed Care – PPO | Admitting: Obstetrics & Gynecology

## 2022-07-05 ENCOUNTER — Encounter: Payer: Self-pay | Admitting: Obstetrics & Gynecology

## 2022-07-05 VITALS — BP 114/70 | HR 70 | Resp 16 | Ht 58.5 in | Wt 149.0 lb

## 2022-07-05 DIAGNOSIS — Z90710 Acquired absence of both cervix and uterus: Secondary | ICD-10-CM

## 2022-07-05 DIAGNOSIS — N951 Menopausal and female climacteric states: Secondary | ICD-10-CM

## 2022-07-05 DIAGNOSIS — Z01419 Encounter for gynecological examination (general) (routine) without abnormal findings: Secondary | ICD-10-CM

## 2022-07-05 NOTE — Progress Notes (Signed)
Rachel Ochoa 10-12-1971 [id number]   History:    51 y.o. [id number]  Married   RP:  Established patient presenting for annual gyn exam    HPI:  S/P Total Hysterectomy.  Hot flushes/night sweats on-off.  Mild dryness vaginally with IC.  Would like to check for Menopause. Pap 12/2019 Neg.  No h/o abnormal Pap. Will repeat Pap at 3-5 years.  Breasts normal.  Mammo 12/2021 Neg.  BMI 30.61.  Will do fasting labs with Fam MD.  Lupus on Plaquenil.  BD Normal 10/2016.  Past medical history,surgical history, family history and social history were all reviewed and documented in the EPIC chart.  Gynecologic History Patient's last menstrual period was 03/01/2004.  Obstetric History OB History  Gravida Para Term Preterm AB Living  4 3 3   1 3   SAB IAB Ectopic Multiple Live Births  1       3    # Outcome Date GA Lbr Len/2nd Weight Sex Delivery Anes PTL Lv  4 SAB           3 Term     M Vag-Spont  N LIV  2 Term     M Vag-Spont  N LIV  1 Term     F Vag-Spont  N LIV     ROS: A ROS was performed and pertinent positives and negatives are included in the history. GENERAL: No fevers or chills. HEENT: No change in vision, no earache, sore throat or sinus congestion. NECK: No pain or stiffness. CARDIOVASCULAR: No chest pain or pressure. No palpitations. PULMONARY: No shortness of breath, cough or wheeze. GASTROINTESTINAL: No abdominal pain, nausea, vomiting or diarrhea, melena or bright red blood per rectum. GENITOURINARY: No urinary frequency, urgency, hesitancy or dysuria. MUSCULOSKELETAL: No joint or muscle pain, no back pain, no recent trauma. DERMATOLOGIC: No rash, no itching, no lesions. ENDOCRINE: No polyuria, polydipsia, no heat or cold intolerance. No recent change in weight. HEMATOLOGICAL: No anemia or easy bruising or bleeding. NEUROLOGIC: No headache, seizures, numbness, tingling or weakness. PSYCHIATRIC: No depression, no loss of interest in normal activity or change in sleep pattern.      Exam:   BP 114/70   Pulse 70   Resp 16   Ht 4' 10.5" (1.486 m)   Wt 149 lb (67.6 kg)   LMP 03/01/2004   BMI 30.61 kg/m   Body mass index is 30.61 kg/m.  General appearance : Well developed well nourished female. No acute distress HEENT: Eyes: no retinal hemorrhage or exudates,  Neck supple, trachea midline, no carotid bruits, no thyroidmegaly Lungs: Clear to auscultation, no rhonchi or wheezes, or rib retractions  Heart: Regular rate and rhythm, no murmurs or gallops Breast:Examined in sitting and supine position were symmetrical in appearance, no palpable masses or tenderness,  no skin retraction, no nipple inversion, no nipple discharge, no skin discoloration, no axillary or supraclavicular lymphadenopathy Abdomen: no palpable masses or tenderness, no rebound or guarding Extremities: no edema or skin discoloration or tenderness  Pelvic: Vulva: Normal             Vagina: No gross lesions or discharge  Cervix/Uterus absent  Adnexa  Without masses or tenderness  Anus: Normal   Assessment/Plan:  51 y.o. female for annual exam   1. Well female exam with routine gynecological exam S/P Total Hysterectomy.  Hot flushes/night sweats on-off.  Mild dryness vaginally with IC.  Would like to check for Menopause. Pap 12/2019 Neg.  No h/o abnormal Pap.  Will repeat Pap at 3-5 years.  Breasts normal.  Mammo 12/2021 Neg.  BMI 30.61.  Will do fasting labs with Fam MD.  Lupus on Plaquenil.  BD Normal 10/2016.  2. S/P total hysterectomy  3. Hot flushes, perimenopausal S/P total Hysterectomy.  Frequent hot flushes.  Will check FSH today.  May try PhytoEstrogens. - FSH  Other orders - VITAMIN D PO; Take by mouth. occ   Genia Del MD, 3:20 PM 07/05/2022

## 2022-07-06 LAB — FOLLICLE STIMULATING HORMONE: FSH: 25.9 m[IU]/mL

## 2022-11-17 LAB — CBC AND DIFFERENTIAL
HCT: 41 (ref 36–46)
Hemoglobin: 13.8 (ref 12.0–16.0)
Neutrophils Absolute: 2.6
Platelets: 174 10*3/uL (ref 150–400)
WBC: 4.3

## 2022-11-17 LAB — CBC: RBC: 4.34 (ref 3.87–5.11)

## 2022-11-17 LAB — POCT ERYTHROCYTE SEDIMENTATION RATE, NON-AUTOMATED: Sed Rate: 6

## 2022-11-18 ENCOUNTER — Encounter: Payer: Self-pay | Admitting: Internal Medicine

## 2023-02-13 ENCOUNTER — Encounter: Payer: Self-pay | Admitting: Internal Medicine

## 2023-07-05 LAB — COMPREHENSIVE METABOLIC PANEL
Albumin: 4.5 (ref 3.5–5.0)
Calcium: 9.8 (ref 8.7–10.7)
Globulin: 2.3
eGFR: 83

## 2023-07-05 LAB — BASIC METABOLIC PANEL
BUN: 19 (ref 4–21)
CO2: 23 — AB (ref 13–22)
Chloride: 101 (ref 99–108)
Creatinine: 0.9 (ref 0.5–1.1)
Glucose: 108
Potassium: 4.3 meq/L (ref 3.5–5.1)
Sodium: 139 (ref 137–147)

## 2023-07-05 LAB — HEPATIC FUNCTION PANEL
ALT: 26 U/L (ref 7–35)
AST: 27 (ref 13–35)
Alkaline Phosphatase: 66 (ref 25–125)

## 2023-07-05 LAB — CBC AND DIFFERENTIAL
HCT: 42 (ref 36–46)
Hemoglobin: 13.5 (ref 12.0–16.0)
Neutrophils Absolute: 3.4
Platelets: 164 10*3/uL (ref 150–400)
WBC: 5.3

## 2023-07-05 LAB — POCT ERYTHROCYTE SEDIMENTATION RATE, NON-AUTOMATED: Sed Rate: 7

## 2023-07-05 LAB — VITAMIN D 25 HYDROXY (VIT D DEFICIENCY, FRACTURES): Vit D, 25-Hydroxy: 22.7

## 2023-07-05 LAB — CBC: RBC: 4.32 (ref 3.87–5.11)

## 2023-07-06 ENCOUNTER — Encounter: Payer: Self-pay | Admitting: Internal Medicine

## 2023-08-04 ENCOUNTER — Other Ambulatory Visit: Payer: Self-pay | Admitting: Internal Medicine

## 2023-08-04 DIAGNOSIS — Z1212 Encounter for screening for malignant neoplasm of rectum: Secondary | ICD-10-CM

## 2023-08-04 DIAGNOSIS — Z1211 Encounter for screening for malignant neoplasm of colon: Secondary | ICD-10-CM

## 2023-10-05 ENCOUNTER — Encounter: Payer: Self-pay | Admitting: Obstetrics and Gynecology

## 2023-10-05 ENCOUNTER — Ambulatory Visit (INDEPENDENT_AMBULATORY_CARE_PROVIDER_SITE_OTHER): Payer: BC Managed Care – PPO | Admitting: Obstetrics and Gynecology

## 2023-10-05 ENCOUNTER — Other Ambulatory Visit (HOSPITAL_COMMUNITY)
Admission: RE | Admit: 2023-10-05 | Discharge: 2023-10-05 | Disposition: A | Discharge status: Home / Self Care | Payer: BC Managed Care – PPO | Source: Ambulatory Visit | Attending: Obstetrics and Gynecology | Admitting: Obstetrics and Gynecology

## 2023-10-05 VITALS — BP 112/70 | HR 74 | Resp 16 | Ht 58.5 in | Wt 148.0 lb

## 2023-10-05 DIAGNOSIS — Z01419 Encounter for gynecological examination (general) (routine) without abnormal findings: Secondary | ICD-10-CM | POA: Diagnosis present

## 2023-10-05 DIAGNOSIS — N951 Menopausal and female climacteric states: Secondary | ICD-10-CM

## 2023-10-05 DIAGNOSIS — E2839 Other primary ovarian failure: Secondary | ICD-10-CM

## 2023-10-05 DIAGNOSIS — Z1211 Encounter for screening for malignant neoplasm of colon: Secondary | ICD-10-CM

## 2023-10-05 DIAGNOSIS — M3211 Endocarditis in systemic lupus erythematosus: Secondary | ICD-10-CM

## 2023-10-05 DIAGNOSIS — Z1231 Encounter for screening mammogram for malignant neoplasm of breast: Secondary | ICD-10-CM

## 2023-10-05 MED ORDER — SEMAGLUTIDE-WEIGHT MANAGEMENT 0.25 MG/0.5ML ~~LOC~~ SOAJ: 0.2500 mg | Freq: Once | SUBCUTANEOUS | Status: DC

## 2023-10-05 MED ORDER — ESTRADIOL 0.05 MG/24HR TD PTWK: 0.0500 mg | MEDICATED_PATCH | TRANSDERMAL | 12 refills | Status: DC

## 2023-10-05 NOTE — Progress Notes (Signed)
52 y.o. y.o. female here for annual exam. Patient's last menstrual period was 03/01/2004.    [id number]  Married   RP:  Established patient presenting for annual gyn exam    HPI:  S/P Total Hysterectomy.  bothersome hotflashes, hair loss, fatigue, weight gain, joint pain.  Mild dryness vaginally with IC.   Pap 12/2019 Neg.  No h/o abnormal Pap. Will repeat Pap at 3-5 years.  Breasts normal.  Mammo 12/2021 Neg.  BMI 30.61.  Will do fasting labs with Fam MD.  Lupus on Plaquenil. reports no cardiac involvement.  Denies any stroke or h/o DVT.  BD Normal 10/2016. Colonoscopy: has not done. Referral placed Body mass index is 30.41 kg/m.     01/25/2022    8:33 AM 06/24/2019    9:27 AM 05/16/2018    1:20 PM  Depression screen PHQ 2/9  Decreased Interest 2 0 0  Down, Depressed, Hopeless 2 0 0  PHQ - 2 Score 4 0 0  Altered sleeping 2    Tired, decreased energy 2    Change in appetite 2    Feeling bad or failure about yourself  0    Trouble concentrating 1    Moving slowly or fidgety/restless 1    Suicidal thoughts 0    PHQ-9 Score 12      Blood pressure 112/70, pulse 74, resp. rate 16, height 4' 10.5" (1.486 m), weight 148 lb (67.1 kg), last menstrual period 03/01/2004.  No results found for: "DIAGPAP", "HPVHIGH", "ADEQPAP"  GYN HISTORY: No results found for: "DIAGPAP", "HPVHIGH", "ADEQPAP"  OB History  Gravida Para Term Preterm AB Living  4 3 3   1 3   SAB IAB Ectopic Multiple Live Births  1       3    # Outcome Date GA Lbr Len/2nd Weight Sex Type Anes PTL Lv  4 SAB           3 Term     M Vag-Spont  N LIV  2 Term     M Vag-Spont  N LIV  1 Term     F Vag-Spont  N LIV    Past Medical History:  Diagnosis Date   Abdominal pain    chronic sees GI IBS   Dyslipidemia    Elevated BP    without diagnosis of hypertension 2/11   Epilepsy (HCC)    better by age 69   IBS (irritable bowel syndrome)    Leukocytopenia    thromboycytopenia, unspecified- sees hematology, rx  oberservation   Lupus (systemic lupus erythematosus) (HCC) 11-2011   Migraine aura, persistent    saw neuro 12/10   NSVD (normal spontaneous vaginal delivery)    X3    Past Surgical History:  Procedure Laterality Date   BREAST SURGERY     BREAST REDUCTION   CHOLECYSTECTOMY     DILATION AND CURETTAGE OF UTERUS     LAPAROSCOPIC OVARIAN CYSTECTOMY Left 02/16/2015   Procedure: LAPAROSCOPIC OVARIAN CYSTECTOMY;  Surgeon: Ok Edwards, MD;  Location: [facility];  Service: Gynecology;  Laterality: Left;  want to put this at 7:30am and have the cast currently at 7:30am moved to follow this one.   LAPAROSCOPIC SALPINGO OOPHERECTOMY Left 02/16/2015   Procedure: LAPAROSCOPIC SALPINGO OOPHORECTOMY;  Surgeon: Ok Edwards, MD;  Location: [facility];  Service: Gynecology;  Laterality: Left;   REDUCTION MAMMAPLASTY Bilateral    1997   TONSILLECTOMY     VAGINAL HYSTERECTOMY     TVH  Current Outpatient Medications on File Prior to Visit  Medication Sig Dispense Refill   hydroxychloroquine (PLAQUENIL) 200 MG tablet Take 200 mg by mouth daily.      Multiple Vitamin (MULTIVITAMIN PO) Take by mouth.     No current facility-administered medications on file prior to visit.    Social History   Socioeconomic History   Marital status: Married    Spouse name: Not on file   Number of children: 3   Years of education: Not on file   Highest education level: Not on file  Occupational History   Occupation: works at the school Public affairs consultant: UNEMPLOYED  Tobacco Use   Smoking status: Never   Smokeless tobacco: Never  Vaping Use   Vaping status: Never Used  Substance and Sexual Activity   Alcohol use: Not Currently   Drug use: No   Sexual activity: Yes    Partners: Male    Birth control/protection: Surgical    Comment: hysterectomy  Other Topics Concern   Not on file  Social History Narrative   From [place]   Lives w/ husband and 3 children   Social Determinants  of Health   Financial Resource Strain: Not on file  Food Insecurity: Not on file  Transportation Needs: Not on file  Physical Activity: Not on file  Stress: Not on file  Social Connections: Not on file  Intimate Partner Violence: Not on file    Family History  Problem Relation Age of Onset   Diabetes Father    Hypertension Father    Breast cancer Maternal Aunt        60s   Breast cancer Cousin 51   Colon cancer Neg Hx    CAD Neg Hx      Allergies  Allergen Reactions   Latex Hives, Itching and Rash    Rash, itching, hives, has epi pen at all times   Amoxicillin Rash    REACTION: rash and redness      Patient's last menstrual period was Patient's last menstrual period was 03/01/2004.Marland Kitchen           Review of Systems Alls systems reviewed and are negative.     OBGyn Exam    A:         Well Woman GYN exam                             P:        Pap smear collected today Encouraged annual mammogram screening Colon cancer screening referral placed today DXA ordered today Labs and immunizations to do with PMD Discussed breast self exams Encouraged healthy lifestyle practices Encouraged Vit D and Calcium  Counseled on the r/b/a/I of HRT use.  Discussed that she will not need progesterone since she does not have a uterus Discussed lower risk for DVT and stroke with the patch.  We discussed that she should get approval from her doctor managing her lupus before starting.  If they do not agree with the medication, we discussed starting veozzah. Side effects from the estrogen include risk of breast tenderness and spotting along with low risk of blood clots and stroke with uncontrolled hypertension. Counseled on the benefits to help improve the bone density during menopause. Discussed estrogen alone does not increase her risk for breast cancer. Weight loss:wygovy sent.  May not be covered with her insurance. She will check for coupons on line.    No  follow-ups on  file.  Earley Favor

## 2023-10-06 LAB — CYTOLOGY - PAP
Comment: NEGATIVE
Diagnosis: NEGATIVE
High risk HPV: NEGATIVE

## 2023-10-06 NOTE — Telephone Encounter (Signed)
Per EB:  "The blood testing should be done.  Let's just try veozah first.There is a coupon and we will need to send to my script Dr. Karma Greaser"

## 2023-11-28 ENCOUNTER — Ambulatory Visit (INDEPENDENT_AMBULATORY_CARE_PROVIDER_SITE_OTHER): Payer: 59 | Admitting: Internal Medicine

## 2023-11-28 ENCOUNTER — Encounter: Payer: Self-pay | Admitting: Internal Medicine

## 2023-11-28 VITALS — BP 116/68 | HR 71 | Temp 98.2°F | Resp 16 | Ht 58.5 in | Wt 152.4 lb

## 2023-11-28 DIAGNOSIS — E7800 Pure hypercholesterolemia, unspecified: Secondary | ICD-10-CM | POA: Diagnosis not present

## 2023-11-28 DIAGNOSIS — Z0000 Encounter for general adult medical examination without abnormal findings: Secondary | ICD-10-CM

## 2023-11-28 DIAGNOSIS — E559 Vitamin D deficiency, unspecified: Secondary | ICD-10-CM | POA: Diagnosis not present

## 2023-11-28 DIAGNOSIS — Z1211 Encounter for screening for malignant neoplasm of colon: Secondary | ICD-10-CM | POA: Diagnosis not present

## 2023-11-28 MED ORDER — VITAMIN D3 50 MCG (2000 UT) PO CAPS: 2000.0000 [IU] | ORAL_CAPSULE | Freq: Every day | ORAL | Status: DC

## 2023-11-28 NOTE — Addendum Note (Signed)
Addended byConrad [place] D on: 11/28/2023 03:17 PM   Modules accepted: Orders

## 2023-11-28 NOTE — Progress Notes (Unsigned)
Subjective:    Patient ID: Rachel Ochoa, female    DOB: 21-Apr-1971, 53 y.o.   MRN: [id number]  DOS:  11/28/2023 Type of visit - description: CPX  Here for CPX. Had a viral syndrome 2 weeks ago, feels recuperated. Having symptoms from menopause, follow-up by gynecology. IBS symptoms at baseline, occasional diarrhea, occasional constipation.  No blood in the stools.  Review of Systems See above   Past Medical History:  Diagnosis Date   Abdominal pain    chronic sees GI IBS   Dyslipidemia    Elevated BP    without diagnosis of hypertension 2/11   Epilepsy (HCC)    better by age 64   IBS (irritable bowel syndrome)    Leukocytopenia    thromboycytopenia, unspecified- sees hematology, rx oberservation   Lupus (systemic lupus erythematosus) (HCC) 11-2011   Migraine aura, persistent    saw neuro 12/10   NSVD (normal spontaneous vaginal delivery)    X3    Past Surgical History:  Procedure Laterality Date   BREAST SURGERY     BREAST REDUCTION   CHOLECYSTECTOMY     DILATION AND CURETTAGE OF UTERUS     LAPAROSCOPIC OVARIAN CYSTECTOMY Left 02/16/2015   Procedure: LAPAROSCOPIC OVARIAN CYSTECTOMY;  Surgeon: Ok Edwards, MD;  Location: [facility];  Service: Gynecology;  Laterality: Left;  want to put this at 7:30am and have the cast currently at 7:30am moved to follow this one.   LAPAROSCOPIC SALPINGO OOPHERECTOMY Left 02/16/2015   Procedure: LAPAROSCOPIC SALPINGO OOPHORECTOMY;  Surgeon: Ok Edwards, MD;  Location: [facility];  Service: Gynecology;  Laterality: Left;   REDUCTION MAMMAPLASTY Bilateral    1997   TONSILLECTOMY     VAGINAL HYSTERECTOMY     TVH    Current Outpatient Medications  Medication Instructions   estradiol (CLIMARA - DOSED IN MG/24 HR) 0.05 mg, Transdermal, Weekly   hydroxychloroquine (PLAQUENIL) 200 mg, Daily   Multiple Vitamin (MULTIVITAMIN PO) Take by mouth.       Objective:   Physical Exam BP 116/68   Pulse 71   Temp 98.2 F (36.8 C) (Oral)    Resp 16   Ht 4' 10.5" (1.486 m)   Wt 152 lb 6 oz (69.1 kg)   LMP 03/01/2004   SpO2 97%   BMI 31.30 kg/m  General: Well developed, NAD, BMI noted Neck: No  thyromegaly  HEENT:  Normocephalic . Face symmetric, atraumatic Lungs:  CTA B Normal respiratory effort, no intercostal retractions, no accessory muscle use. Heart: RRR,  no murmur.  Abdomen:  Not distended, soft, non-tender. No rebound or rigidity.   Lower extremities: no pretibial edema bilaterally  Skin: Exposed areas without rash. Not pale. Not jaundice Neurologic:  alert & oriented X3.  Speech normal, gait appropriate for age and unassisted Strength symmetric and appropriate for age.  Psych: Cognition and judgment appear intact.  Cooperative with normal attention span and concentration.  Behavior appropriate. No anxious or depressed appearing.     Assessment     Assessment Elevated BP Dyslipidemia Systemic lupus  Leukocytopenia, thrombocytopenia. Saw hematology, Rx observation. See hem-onc if  platelets < 50K IBS Migraines, saw neurology 2010 . Epilepsy (better by age 39) H/o vaginal hysterectomy + TB test ([facility])  05/19/2017 f/u by the [facility]   PLAN Here for CPX, last visit 12/2021 -Td 2014 - Declined all vaccines, pros>cons, will call if changes her opinion. -CCS:  she actually had a colonoscopy for IBS in 2007, normal.  Patient received  a Cologuard box but is unsure about proceeding, she prefers to do a colonoscopy.  Will arrange for GI referral.  -Female care [facility] gynecology December 2024.  Gynecology recommended a mammogram, encouraged patient to proceed. - DEXA: 2018 @ gyn.    - labs FLP A1c TSH vitamin D  SLE: LOV rheumatology 07/05/2023 Menopausal: Per gynecology, was recommended hormonal patch, patient reports was recommended by rheumatology not to pursue HRT. Vitamin D deficiency: Not taking supplements, encouraged to do, 2000 units daily RTC 1 year

## 2023-11-28 NOTE — Patient Instructions (Addendum)
If you change your mind about vaccines let me know  Proceed with a mammogram as recommended by gynecology    GO TO THE LAB : Get the blood work     Next visit with me in 1 year    Please schedule it at the front desk       "Health Care Power of attorney" ,  "Living will" (Advance care planning documents)  If you already have a living will or healthcare power of attorney, is recommended you bring the copy to be scanned in your chart.   The document will be available to all the doctors you see in the system.  Advance care planning is a process that supports adults in  understanding and sharing their preferences regarding future medical care.  The patient's preferences are recorded in documents called Advance Directives and the can be modified at any time while the patient is in full mental capacity.   If you don't have one, please consider create one.      More information at: [email or web address]

## 2023-11-29 ENCOUNTER — Encounter: Payer: Self-pay | Admitting: Internal Medicine

## 2023-11-29 LAB — LIPID PANEL
Cholesterol: 202 mg/dL — ABNORMAL HIGH (ref 0–200)
HDL: 51.9 mg/dL (ref >39.00)
LDL Cholesterol: 129 mg/dL — ABNORMAL HIGH (ref 0–99)
NonHDL: 150.4
Total CHOL/HDL Ratio: 4
Triglycerides: 107 mg/dL (ref 0.0–149.0)
VLDL: 21.4 mg/dL (ref 0.0–40.0)

## 2023-11-29 LAB — HEMOGLOBIN A1C: Hgb A1c MFr Bld: 5.8 % (ref 4.6–6.5)

## 2023-11-29 LAB — TSH: TSH: 1.33 u[IU]/mL (ref 0.35–5.50)

## 2023-11-29 LAB — VITAMIN D 25 HYDROXY (VIT D DEFICIENCY, FRACTURES): VITD: 22.73 ng/mL — ABNORMAL LOW (ref 30.00–100.00)

## 2023-11-29 NOTE — Assessment & Plan Note (Signed)
Here for CPX, last visit 12/2021 -Td 2014 - Declined all vaccines, pros>cons, will call if changes her opinion. -CCS:  she actually had a colonoscopy for IBS in 2007, normal.  Patient received a Cologuard box but is unsure about proceeding, she prefers to do a colonoscopy.  Will arrange for GI referral. -Female care [facility] gynecology December 2024.  Gynecology recommended a mammogram, encouraged patient to proceed. - DEXA: 2018 @ gyn.    - labs FLP A1c TSH vitamin D

## 2023-11-29 NOTE — Assessment & Plan Note (Signed)
Here for CPX, last visit 12/2021 We also address the following issues: SLE: LOV rheumatology 07/05/2023 Menopausal: Per gynecology, was recommended hormonal patch, patient reports was recommended by rheumatology not to pursue HRT. Vitamin D deficiency: Not taking supplements, encouraged to do, 2000 units daily RTC 1 year

## 2023-12-04 ENCOUNTER — Encounter: Payer: Self-pay | Admitting: Internal Medicine

## 2023-12-04 MED ORDER — VITAMIN D (ERGOCALCIFEROL) 1.25 MG (50000 UNIT) PO CAPS: 50000.0000 [IU] | ORAL_CAPSULE | ORAL | 0 refills | Status: DC

## 2023-12-04 NOTE — Addendum Note (Signed)
Addended byConrad [place] D on: 12/04/2023 01:52 PM   Modules accepted: Orders

## 2024-01-02 LAB — BASIC METABOLIC PANEL
BUN: 10 (ref 4–21)
CO2: 21 (ref 13–22)
Chloride: 103 (ref 99–108)
Creatinine: 0.6 (ref 0.5–1.1)
Glucose: 84
Potassium: 4.2 meq/L (ref 3.5–5.1)
Sodium: 142 (ref 137–147)

## 2024-01-02 LAB — COMPREHENSIVE METABOLIC PANEL
Albumin: 4.4 (ref 3.5–5.0)
Calcium: 9.2 (ref 8.7–10.7)
Globulin: 2.5
eGFR: 107

## 2024-01-02 LAB — CBC: RBC: 4.17 (ref 3.87–5.11)

## 2024-01-02 LAB — CBC AND DIFFERENTIAL
HCT: 39 (ref 36–46)
Hemoglobin: 12.9 (ref 12.0–16.0)
Neutrophils Absolute: 3.4
Platelets: 173 10*3/uL (ref 150–400)
WBC: 5.3

## 2024-01-02 LAB — HEPATIC FUNCTION PANEL
ALT: 16 U/L (ref 7–35)
AST: 21 (ref 13–35)
Alkaline Phosphatase: 77 (ref 25–125)
Bilirubin, Total: 0.2

## 2024-01-04 ENCOUNTER — Encounter: Payer: Self-pay | Admitting: Internal Medicine

## 2024-01-22 ENCOUNTER — Encounter: Payer: Self-pay | Admitting: Physician Assistant

## 2024-01-22 ENCOUNTER — Ambulatory Visit: Payer: 59 | Admitting: Physician Assistant

## 2024-01-22 VITALS — BP 134/80 | HR 84 | Ht 58.5 in | Wt 151.5 lb

## 2024-01-22 DIAGNOSIS — Z1211 Encounter for screening for malignant neoplasm of colon: Secondary | ICD-10-CM

## 2024-01-22 DIAGNOSIS — K6289 Other specified diseases of anus and rectum: Secondary | ICD-10-CM

## 2024-01-22 DIAGNOSIS — K5900 Constipation, unspecified: Secondary | ICD-10-CM | POA: Diagnosis not present

## 2024-01-22 MED ORDER — SUFLAVE 178.7 G PO SOLR: 1.0000 | Freq: Once | ORAL | 0 refills | Status: AC

## 2024-01-22 NOTE — Progress Notes (Signed)
 Chief Complaint: Constipation, discuss colonoscopy  HPI:    Rachel Ochoa is a 53 year old Hispanic speaking female with a past medical history as listed below including IBS and lupus, known to Dr. Leone Payor, who was referred to me by Wanda Plump, MD for a complaint of constipation and to discuss a colonoscopy.      06/28/2006 EGD normal.  Sigmoidoscopy on the same day was normal and IBS was suspected.    01/04/2024 CBC and CMP normal.    Today, the patient presents to clinic and explains that she was having issues with constipation where she might have a bowel movement only 2-3 times a week and no straining.  She changed her diet and is trying to eat more fruits and vegetables as well as drink more water and in general her bowel movements are much better.      She does have occasional shooting rectal pain while sleeping.  The first time this occurred she thought it was just a dream but the second time it occurred she actually did feel some discomfort even after she woke up near her rectum which went away quickly after.    Denies fever, chills, weight loss, blood in her stool, nausea, vomiting or abdominal pain.  Past Medical History:  Diagnosis Date   Abdominal pain    chronic sees GI IBS   Dyslipidemia    Elevated BP    without diagnosis of hypertension 2/11   Epilepsy (HCC)    better by age 62   IBS (irritable bowel syndrome)    Leukocytopenia    thromboycytopenia, unspecified- sees hematology, rx oberservation   Lupus (systemic lupus erythematosus) (HCC) 11-2011   Migraine aura, persistent    saw neuro 12/10   NSVD (normal spontaneous vaginal delivery)    X3    Past Surgical History:  Procedure Laterality Date   BREAST SURGERY     BREAST REDUCTION   CHOLECYSTECTOMY     DILATION AND CURETTAGE OF UTERUS     LAPAROSCOPIC OVARIAN CYSTECTOMY Left 02/16/2015   Procedure: LAPAROSCOPIC OVARIAN CYSTECTOMY;  Surgeon: Ok Edwards, MD;  Location: [facility];  Service: Gynecology;   Laterality: Left;  want to put this at 7:30am and have the cast currently at 7:30am moved to follow this one.   LAPAROSCOPIC SALPINGO OOPHERECTOMY Left 02/16/2015   Procedure: LAPAROSCOPIC SALPINGO OOPHORECTOMY;  Surgeon: Ok Edwards, MD;  Location: [facility];  Service: Gynecology;  Laterality: Left;   REDUCTION MAMMAPLASTY Bilateral    1997   TONSILLECTOMY     VAGINAL HYSTERECTOMY     TVH    Current Outpatient Medications  Medication Sig Dispense Refill   hydroxychloroquine (PLAQUENIL) 200 MG tablet Take 400 mg by mouth daily. Take 400mg  every day for 6 days, then hold on the 7th day.     Current Facility-Administered Medications  Medication Dose Route Frequency Provider Last Rate Last Admin   Semaglutide-Weight Management SOAJ 0.25 mg  0.25 mg Subcutaneous Once         Allergies as of 01/22/2024 - Review Complete 11/29/2023  Allergen Reaction Noted   Latex Hives, Itching, and Rash 10/23/2013   Amoxicillin Rash 05/11/2010    Family History  Problem Relation Age of Onset   Diabetes Father    Hypertension Father    Breast cancer Maternal Aunt        79s   Breast cancer Cousin 71   Colon cancer Neg Hx    CAD Neg Hx     Social  History   Socioeconomic History   Marital status: Married    Spouse name: Not on file   Number of children: 3   Years of education: Not on file   Highest education level: Not on file  Occupational History   Occupation: works at the school Public affairs consultant: UNEMPLOYED  Tobacco Use   Smoking status: Never   Smokeless tobacco: Never  Vaping Use   Vaping status: Never Used  Substance and Sexual Activity   Alcohol use: Not Currently   Drug use: No   Sexual activity: Yes    Partners: Male    Birth control/protection: Surgical    Comment: hysterectomy  Other Topics Concern   Not on file  Social History Narrative   From [place]   Lives w/ husband and 3 children   Social Drivers of Corporate investment banker  Strain: Not on file  Food Insecurity: Not on file  Transportation Needs: Not on file  Physical Activity: Not on file  Stress: Not on file  Social Connections: Not on file  Intimate Partner Violence: Not on file    Review of Systems:    Constitutional: No weight loss, fever or chills Skin: No rash  Cardiovascular: No chest pain Respiratory: No SOB  Gastrointestinal: See HPI and otherwise negative Genitourinary: No dysuria  Neurological: No headache, dizziness or syncope Musculoskeletal: No new muscle or joint pain Hematologic: No bleeding  Psychiatric: No history of depression or anxiety   Physical Exam:  Vital signs: BP 134/80   Pulse 84   Ht 4' 10.5" (1.486 m)   Wt 151 lb 8 oz (68.7 kg)   LMP 03/01/2004   SpO2 93%   BMI 31.12 kg/m    Constitutional:   Pleasant HIspanic female appears to be in NAD, Well developed, Well nourished, alert and cooperative Head:  Normocephalic and atraumatic. Eyes:   PEERL, EOMI. No icterus. Conjunctiva pink. Ears:  Normal auditory acuity. Neck:  Supple Throat: Oral cavity and pharynx without inflammation, swelling or lesion.  Respiratory: Respirations even and unlabored. Lungs clear to auscultation bilaterally.   No wheezes, crackles, or rhonchi.  Cardiovascular: Normal S1, S2. No MRG. Regular rate and rhythm. No peripheral edema, cyanosis or pallor.  Gastrointestinal:  Soft, nondistended, nontender. No rebound or guarding. Normal bowel sounds. No appreciable masses or hepatomegaly. Rectal:  Declined Msk:  Symmetrical without gross deformities. Without edema, no deformity or joint abnormality.  Neurologic:  Alert and  oriented x4;  grossly normal neurologically.  Skin:   Dry and intact without significant lesions or rashes. Psychiatric: Demonstrates good judgement and reason without abnormal affect or behaviors.  RELEVANT LABS AND IMAGING: CBC    Component Value Date/Time   WBC 5.3 01/02/2024 0000   WBC 4.2 01/25/2022 0841   RBC 4.17  01/02/2024 0000   HGB 12.9 01/02/2024 0000   HGB 13.5 12/24/2009 0915   HCT 39 01/02/2024 0000   HCT 39.4 12/24/2009 0915   PLT 173 01/02/2024 0000   PLT 116 (L) 12/24/2009 0915   MCV 96.3 01/25/2022 0841   MCV 94.3 12/24/2009 0915   MCH 31.5 11/29/2016 0928   MCHC 33.0 01/25/2022 0841   RDW 12.8 01/25/2022 0841   RDW 12.4 12/24/2009 0915   LYMPHSABS 1.3 01/25/2022 0841   LYMPHSABS 1.0 12/24/2009 0915   MONOABS 0.4 01/25/2022 0841   MONOABS 0.4 12/24/2009 0915   EOSABS 0.0 01/25/2022 0841   EOSABS 0.0 12/24/2009 0915   BASOSABS 0.0 01/25/2022 0841  BASOSABS 0.0 12/24/2009 0915    CMP     Component Value Date/Time   NA 142 01/02/2024 0000   K 4.2 01/02/2024 0000   CL 103 01/02/2024 0000   CO2 21 01/02/2024 0000   GLUCOSE 99 01/25/2022 0841   BUN 10 01/02/2024 0000   CREATININE 0.6 01/02/2024 0000   CREATININE 0.72 01/25/2022 0841   CREATININE 0.79 11/29/2016 0928   CALCIUM 9.2 01/02/2024 0000   PROT 6.7 01/25/2022 0841   ALBUMIN 4.4 01/02/2024 0000   AST 21 01/02/2024 0000   ALT 16 01/02/2024 0000   ALKPHOS 77 01/02/2024 0000   BILITOT 0.4 01/25/2022 0841   GFRNONAA 73 03/11/2020 0000   GFRAA 85 03/11/2020 0000    Assessment: 1.  Constipation: Better with a change in diet towards more fiber; likely diet related +/- IBS history 2.  Rectal pain: Occasional shooting pain while she is sleeping; question proctalgia fugax 3.  Screening for colorectal cancer: Normal sigmoidoscopy in 2007, patient is now 60 and has not had a colonoscopy  Plan: 1.  Scheduled patient for screening colonoscopy in the [facility] with Dr. Leone Payor.  Did provide the patient a detailed list of risks for the procedure and she agrees to proceed. Patient is appropriate for endoscopic procedure(s) in the ambulatory ([facility]) setting.  2.  Patient will have a 2-day bowel prep for procedure given history of constipation. 3.  Discussed basics for constipation including increased fiber intake, water and exercise.   If this is not working for her any longer would recommend she start taking 1 dose daily of MiraLAX. 4.  Discussed very occasional rectal pain while she is sleeping, question proctalgia fugax, told her to let us know if this gets any more frequent 5.  Patient to follow in clinic per recommendations after time of procedure.  Hyacinth Meeker, PA-C [facility] 01/22/2024, 2:02 PM  Cc: Wanda Plump, MD

## 2024-01-22 NOTE — Patient Instructions (Signed)
 Increase fiber with supplement like Benefiber.   You have been scheduled for a colonoscopy. Please follow written instructions given to you at your visit today.   If you use inhalers (even only as needed), please bring them with you on the day of your procedure.  DO NOT TAKE 7 DAYS PRIOR TO TEST- Trulicity (dulaglutide) Ozempic, Wegovy (semaglutide) Mounjaro (tirzepatide) Bydureon Bcise (exanatide extended release)  DO NOT TAKE 1 DAY PRIOR TO YOUR TEST Rybelsus (semaglutide) Adlyxin (lixisenatide) Victoza (liraglutide) Byetta (exanatide) ___________________________________________________________________________  Bonita Quin will receive your bowel preparation through [facility], which ensures the lowest copay and home delivery, with outreach via text or call from an [phone] number. Please respond promptly to avoid rescheduling of your procedure. If you are interested in alternative options or have any questions regarding your prep, please contact them at [phone] ____________________________________________________________________________  Your Provider Has Sent Your Bowel Prep Regimen To [facility]   [facility] will contact you to verify your information and collect your copay, if applicable. Enjoy the comfort of your home while your prescription is mailed to you, FREE of any shipping charges.   [facility] accepts all major insurance benefits and applies discounts & coupons.  Have additional questions?   Chat: [email or web address] Call: [phone] Email: [email or web address] NCPDP: [id number]  How will [facility] contact you?  With a Welcome phone call,  a Welcome text and a checkout link in text form.  Texts you receive from [phone] Are NOT Spam.  *To set up delivery, you must complete the checkout process via link or speak to one of the patient care representatives. If [facility] is unable to reach you, your prescription may be delayed.  To avoid long hold times  on the phone, you may also utilize the secure chat feature on the [facility] website to request that they call you back for transaction completion or to expedite your concerns.   _______________________________________________________  If your blood pressure at your visit was 140/90 or greater, please contact your primary care physician to follow up on this.  _______________________________________________________  If you are age 59 or older, your body mass index should be between 23-30. Your Body mass index is 31.12 kg/m. If this is out of the aforementioned range listed, please consider follow up with your Primary Care Provider.  If you are age 67 or younger, your body mass index should be between 19-25. Your Body mass index is 31.12 kg/m. If this is out of the aformentioned range listed, please consider follow up with your Primary Care Provider.   ________________________________________________________  The [facility] providers would like to encourage you to use [facility] to communicate with providers for non-urgent requests or questions.  Due to long hold times on the telephone, sending your provider a message by [facility] may be a faster and more efficient way to get a response.  Please allow 48 business hours for a response.  Please remember that this is for non-urgent requests.  _______________________________________________________

## 2024-02-12 ENCOUNTER — Ambulatory Visit (INDEPENDENT_AMBULATORY_CARE_PROVIDER_SITE_OTHER): Admitting: Internal Medicine

## 2024-02-12 ENCOUNTER — Ambulatory Visit (HOSPITAL_BASED_OUTPATIENT_CLINIC_OR_DEPARTMENT_OTHER)
Admission: RE | Admit: 2024-02-12 | Discharge: 2024-02-12 | Disposition: A | Discharge status: Home / Self Care | Source: Ambulatory Visit | Attending: Internal Medicine | Admitting: Internal Medicine

## 2024-02-12 ENCOUNTER — Encounter: Payer: Self-pay | Admitting: Internal Medicine

## 2024-02-12 VITALS — BP 126/80 | HR 86 | Temp 98.1°F | Resp 16 | Ht 58.5 in | Wt 149.5 lb

## 2024-02-12 DIAGNOSIS — R1012 Left upper quadrant pain: Secondary | ICD-10-CM | POA: Diagnosis not present

## 2024-02-12 DIAGNOSIS — R0781 Pleurodynia: Secondary | ICD-10-CM | POA: Insufficient documentation

## 2024-02-12 DIAGNOSIS — M329 Systemic lupus erythematosus, unspecified: Secondary | ICD-10-CM | POA: Diagnosis not present

## 2024-02-12 NOTE — Patient Instructions (Signed)
  INSTRUCTIONS  FOR TODAY   GO TO THE LAB : Get the blood work    STOP BY THE FIRST FLOOR:  get the XR   If you have severe symptoms, rash, fever chills, difficulty breathing: Go to the ER.

## 2024-02-12 NOTE — Progress Notes (Unsigned)
 Subjective:    Patient ID: Rachel Ochoa, female    DOB: Oct 02, 1971, 53 y.o.   MRN: [id number]  DOS:  02/12/2024 Type of visit - description: Acute  Symptoms started a week ago after she took a walk: Pain at the left upper quadrant of the abdomen, just under the rib cage. Since then the pain is steady, decreases with Tylenol or ibuprofen. Does not change with p.o. intake. Does change with moving her torso in general including bending forward.  Does not recall any injury, no rash. Denies fever or chills.  No nausea or vomiting. Urine is normal.  No major respiratory symptoms other than some sore throat for the last 3 days, she thinks related to allergies.  No actual cough. Denies leg swelling, no recent airplane trips or prolonged car trip.  Review of Systems See above   Past Medical History:  Diagnosis Date   Abdominal pain    chronic sees GI IBS   Dyslipidemia    Elevated BP    without diagnosis of hypertension 2/11   Epilepsy (HCC)    better by age 33   IBS (irritable bowel syndrome)    Leukocytopenia    thromboycytopenia, unspecified- sees hematology, rx oberservation   Lupus (systemic lupus erythematosus) (HCC) 11-2011   Migraine aura, persistent    saw neuro 12/10   NSVD (normal spontaneous vaginal delivery)    X3    Past Surgical History:  Procedure Laterality Date   BREAST SURGERY     BREAST REDUCTION   CHOLECYSTECTOMY     DILATION AND CURETTAGE OF UTERUS     LAPAROSCOPIC OVARIAN CYSTECTOMY Left 02/16/2015   Procedure: LAPAROSCOPIC OVARIAN CYSTECTOMY;  Surgeon: Davia Erps, MD;  Location: [facility];  Service: Gynecology;  Laterality: Left;  want to put this at 7:30am and have the cast currently at 7:30am moved to follow this one.   LAPAROSCOPIC SALPINGO OOPHERECTOMY Left 02/16/2015   Procedure: LAPAROSCOPIC SALPINGO OOPHORECTOMY;  Surgeon: Davia Erps, MD;  Location: [facility];  Service: Gynecology;  Laterality: Left;   REDUCTION MAMMAPLASTY Bilateral     1997   TONSILLECTOMY     VAGINAL HYSTERECTOMY     TVH    Current Outpatient Medications  Medication Instructions   hydroxychloroquine (PLAQUENIL) 400 mg, Daily       Objective:   Physical Exam Abdominal:      BP 126/80   Pulse 86   Temp 98.1 F (36.7 C) (Oral)   Resp 16   Ht 4' 10.5" (1.486 m)   Wt 149 lb 8 oz (67.8 kg)   LMP 03/01/2004   SpO2 98%   BMI 30.71 kg/m  General:   Well developed, NAD, BMI noted.  HEENT:  Normocephalic . Face symmetric, atraumatic Lungs:  CTA B Normal respiratory effort, no intercostal retractions, no accessory muscle use. Heart: RRR,  no murmur.  Abdomen:  Not distended, soft, non-tender. No rebound or rigidity.   Skin: Not pale. Not jaundice Lower extremities: no pretibial edema bilaterally.  Calves, soft-symmetric and nontender Neurologic:  alert & oriented X3.  Speech normal, gait appropriate for age and unassisted Psych--  Cognition and judgment appear intact.  Cooperative with normal attention span and concentration.  Behavior appropriate. No anxious or depressed appearing.     Assessment   Assessment Elevated BP Dyslipidemia Systemic lupus  Leukocytopenia, thrombocytopenia. Saw hematology, Rx observation. See hem-onc if  platelets < 50K IBS Migraines, saw neurology 2010 . Epilepsy (better by age 50) H/o vaginal hysterectomy +  TB test (Tb Gold)  05/19/2017 f/u by the HD   PLAN LUQ/left distal chest pain: Pain as described above in the context of SLE.  Also was diagnosed with pneumonia last month on clinical grounds.  Treated with antibiotics, no cough at this point. Nontoxic-appearing, VSS. EKG today: NSR, no evidence of pericarditis, similar to previous EKGs. DDx is large but includes MSK etiologies, pleurisy, shingles sine herpete,  versus others. Plan: CBC, BMP, sed rate, D-dimer.  Chest x-ray stat. ER if severe symptoms, further advised for results. Constipation: Saw GI 01/22/2024, had constipation,  rectal pain, scheduled for a colonoscopy with a 2-day bowel prep  Lupus, fatigue, sicca syndrome, arthralgias: Saw rheumatology 01/02/2024, on hydroxychloroquine, felt to be stable

## 2024-02-13 ENCOUNTER — Encounter: Payer: Self-pay | Admitting: Internal Medicine

## 2024-02-13 LAB — CBC WITH DIFFERENTIAL/PLATELET
Basophils Absolute: 0 10*3/uL (ref 0.0–0.1)
Basophils Relative: 0.5 % (ref 0.0–3.0)
Eosinophils Absolute: 0.1 10*3/uL (ref 0.0–0.7)
Eosinophils Relative: 1.1 % (ref 0.0–5.0)
HCT: 42.6 % (ref 36.0–46.0)
Hemoglobin: 14.4 g/dL (ref 12.0–15.0)
Lymphocytes Relative: 22.4 % (ref 12.0–46.0)
Lymphs Abs: 1.2 10*3/uL (ref 0.7–4.0)
MCHC: 33.8 g/dL (ref 30.0–36.0)
MCV: 95.4 fl (ref 78.0–100.0)
Monocytes Absolute: 0.4 10*3/uL (ref 0.1–1.0)
Monocytes Relative: 7.1 % (ref 3.0–12.0)
Neutro Abs: 3.7 10*3/uL (ref 1.4–7.7)
Neutrophils Relative %: 68.9 % (ref 43.0–77.0)
Platelets: 189 10*3/uL (ref 150.0–400.0)
RBC: 4.47 Mil/uL (ref 3.87–5.11)
RDW: 13.3 % (ref 11.5–15.5)
WBC: 5.4 10*3/uL (ref 4.0–10.5)

## 2024-02-13 LAB — D-DIMER, QUANTITATIVE: D-Dimer, Quant: 0.24 ug{FEU}/mL (ref <0.50)

## 2024-02-13 LAB — BASIC METABOLIC PANEL WITH GFR
BUN: 20 mg/dL (ref 6–23)
CO2: 27 meq/L (ref 19–32)
Calcium: 10 mg/dL (ref 8.4–10.5)
Chloride: 101 meq/L (ref 96–112)
Creatinine, Ser: 0.67 mg/dL (ref 0.40–1.20)
GFR: 101.1 mL/min (ref >60.00)
Glucose, Bld: 95 mg/dL (ref 70–99)
Potassium: 4.5 meq/L (ref 3.5–5.1)
Sodium: 138 meq/L (ref 135–145)

## 2024-02-13 LAB — SEDIMENTATION RATE: Sed Rate: 4 mm/h (ref 0–30)

## 2024-02-13 NOTE — Assessment & Plan Note (Signed)
 LUQ abdomen /left distal chest pain: Pain as described above in the context of SLE.  Also was diagnosed with pneumonia last month on clinical grounds.  Treated with antibiotics, no cough at this point. Nontoxic-appearing, VSS. EKG today: NSR, no evidence of pericarditis, no change compared to previous  EKGs. DDx is large but includes MSK etiologies, pleurisy, shingles sine herpete,  versus others. Plan: CBC, BMP, sed rate, D-dimer.  Chest x-ray stat. ER if severe symptoms, further advised for results. Addendum: Chest x-ray no acute, D-dimer negative.  Recommend observation, if not gradually better to let us  know.  CT chest and/or abdomen?.  ER if severe symptoms. Constipation: Saw GI 01/22/2024, had constipation, rectal pain, scheduled for a colonoscopy with a 2-day bowel prep Lupus, fatigue, sicca syndrome, arthralgias: Saw rheumatology 01/02/2024, on hydroxychloroquine, felt to be stable

## 2024-02-23 ENCOUNTER — Encounter: Payer: Self-pay | Admitting: Internal Medicine

## 2024-02-23 ENCOUNTER — Ambulatory Visit: Admitting: Internal Medicine

## 2024-02-23 VITALS — BP 126/78 | HR 68 | Temp 98.1°F | Resp 22 | Ht 58.5 in | Wt 151.0 lb

## 2024-02-23 DIAGNOSIS — K648 Other hemorrhoids: Secondary | ICD-10-CM

## 2024-02-23 DIAGNOSIS — Z1211 Encounter for screening for malignant neoplasm of colon: Secondary | ICD-10-CM

## 2024-02-23 MED ORDER — SODIUM CHLORIDE 0.9 % IV SOLN: 500.0000 mL | INTRAVENOUS | Status: DC

## 2024-02-23 NOTE — Patient Instructions (Addendum)
 No polyps or cancer were seen. Hemorrhoids were swollen - that can happen with a prep. We all have hemorrhoids, FYI.  Next routine colonoscopy or other screening test in 10 years - 2035.  I appreciate the opportunity to care for you. Kenney Peacemaker, MD, [facility]  **Handouts given on hemorrhoids**  YOU HAD AN ENDOSCOPIC PROCEDURE TODAY AT THE [facility]:   Refer to the procedure report that was given to you for any specific questions about what was found during the examination.  If the procedure report does not answer your questions, please call your gastroenterologist to clarify.  If you requested that your care partner not be given the details of your procedure findings, then the procedure report has been included in a sealed envelope for you to review at your convenience later.  YOU SHOULD EXPECT: Some feelings of bloating in the abdomen. Passage of more gas than usual.  Walking can help get rid of the air that was put into your GI tract during the procedure and reduce the bloating. If you had a lower endoscopy (such as a colonoscopy or flexible sigmoidoscopy) you may notice spotting of blood in your stool or on the toilet paper. If you underwent a bowel prep for your procedure, you may not have a normal bowel movement for a few days.  Please Note:  You might notice some irritation and congestion in your nose or some drainage.  This is from the oxygen used during your procedure.  There is no need for concern and it should clear up in a day or so.  SYMPTOMS TO REPORT IMMEDIATELY:  Following lower endoscopy (colonoscopy or flexible sigmoidoscopy):  Excessive amounts of blood in the stool  Significant tenderness or worsening of abdominal pains  Swelling of the abdomen that is new, acute  Fever of 100F or higher  For urgent or emergent issues, a gastroenterologist can be reached at any hour by calling [phone]. Do not use MyChart messaging for urgent concerns.    DIET:  We  do recommend a small meal at first, but then you may proceed to your regular diet.  Drink plenty of fluids but you should avoid alcoholic beverages for 24 hours.  ACTIVITY:  You should plan to take it easy for the rest of today and you should NOT DRIVE or use heavy machinery until tomorrow (because of the sedation medicines used during the test).    FOLLOW UP: Our staff will call the number listed on your records the next business day following your procedure.  We will call around 7:15- 8:00 am to check on you and address any questions or concerns that you may have regarding the information given to you following your procedure. If we do not reach you, we will leave a message.     If any biopsies were taken you will be contacted by phone or by letter within the next 1-3 weeks.  Please call us  at [phone] if you have not heard about the biopsies in 3 weeks.    SIGNATURES/CONFIDENTIALITY: You and/or your care partner have signed paperwork which will be entered into your electronic medical record.  These signatures attest to the fact that that the information above on your After Visit Summary has been reviewed and is understood.  Full responsibility of the confidentiality of this discharge information lies with you and/or your care-partner.

## 2024-02-23 NOTE — Progress Notes (Signed)
 Patient states there have been no changes to medical or surgical history since time of pre-visit.

## 2024-02-23 NOTE — Progress Notes (Signed)
 [facility] History and Physical   Primary Care Physician:  Ezell Hollow, MD   Reason for Procedure:  Colon cancer screening  Plan:    Colonoscopy     HPI: Rachel Ochoa is a 53 y.o. female presenting for screening colonoscopy.  There is a history of constipation that was improved after diet changes.  She also has intermittent episodic rectal pain query proctalgia fugax. Also having LUQ rib pain   Past Medical History:  Diagnosis Date   Abdominal pain    chronic sees GI IBS   Dyslipidemia    Elevated BP    without diagnosis of hypertension 2/11   Epilepsy (HCC)    better by age 3   IBS (irritable bowel syndrome)    Leukocytopenia    thromboycytopenia, unspecified- sees hematology, rx oberservation   Lupus (systemic lupus erythematosus) (HCC) 11-2011   Migraine aura, persistent    saw neuro 12/10   NSVD (normal spontaneous vaginal delivery)    X3    Past Surgical History:  Procedure Laterality Date   BREAST SURGERY     BREAST REDUCTION   CHOLECYSTECTOMY     DILATION AND CURETTAGE OF UTERUS     LAPAROSCOPIC OVARIAN CYSTECTOMY Left 02/16/2015   Procedure: LAPAROSCOPIC OVARIAN CYSTECTOMY;  Surgeon: Davia Erps, MD;  Location: [facility];  Service: Gynecology;  Laterality: Left;  want to put this at 7:30am and have the cast currently at 7:30am moved to follow this one.   LAPAROSCOPIC SALPINGO OOPHERECTOMY Left 02/16/2015   Procedure: LAPAROSCOPIC SALPINGO OOPHORECTOMY;  Surgeon: Davia Erps, MD;  Location: [facility];  Service: Gynecology;  Laterality: Left;   REDUCTION MAMMAPLASTY Bilateral    1997   TONSILLECTOMY     VAGINAL HYSTERECTOMY     TVH    Prior to Admission medications   Medication Sig Start Date End Date Taking? Authorizing Provider  hydroxychloroquine  (PLAQUENIL ) 200 MG tablet Take 400 mg by mouth daily. Take 400mg  every day for 6 days, then hold on the 7th day.    [provider]    Current Outpatient Medications  Medication  Sig Dispense Refill   hydroxychloroquine  (PLAQUENIL ) 200 MG tablet Take 400 mg by mouth daily. Take 400mg  every day for 6 days, then hold on the 7th day.     Current Facility-Administered Medications  Medication Dose Route Frequency Provider Last Rate Last Admin   0.9 %  sodium chloride infusion  500 mL Intravenous Continuous Kenney Peacemaker, MD        Allergies as of 02/23/2024 - Review Complete 02/23/2024  Allergen Reaction Noted   Latex Hives, Itching, Rash, and Dermatitis 10/23/2013   Amoxicillin Rash and Dermatitis 05/11/2010    Family History  Problem Relation Age of Onset   Diabetes Father    Hypertension Father    Breast cancer Maternal Aunt        42s   Breast cancer Cousin 18   Colon cancer Neg Hx    CAD Neg Hx    Colon polyps Neg Hx    Pancreatic cancer Neg Hx    Stomach cancer Neg Hx    Esophageal cancer Neg Hx    Rectal cancer Neg Hx     Social History   Socioeconomic History   Marital status: Married    Spouse name: Not on file   Number of children: 3   Years of education: Not on file   Highest education level: Not on file  Occupational History   Occupation:  works at the school Public affairs consultant: UNEMPLOYED  Tobacco Use   Smoking status: Never   Smokeless tobacco: Never  Vaping Use   Vaping status: Never Used  Substance and Sexual Activity   Alcohol use: Yes    Comment: occ glass of wine   Drug use: No   Sexual activity: Yes    Partners: Male    Birth control/protection: Surgical    Comment: hysterectomy  Other Topics Concern   Not on file  Social History Narrative   From [place]   Lives w/ husband and 3 children   Social Drivers of Corporate investment banker Strain: Not on file  Food Insecurity: Not on file  Transportation Needs: Not on file  Physical Activity: Not on file  Stress: Not on file  Social Connections: Not on file  Intimate Partner Violence: Not on file    Review of Systems:  All other review of  systems negative except as mentioned in the HPI.  Physical Exam: Vital signs BP (!) 155/83   Pulse 81   Temp 98.1 F (36.7 C) (Skin)   Resp 19   Ht 4' 10.5" (1.486 m)   Wt 151 lb (68.5 kg)   LMP 03/01/2004   SpO2 100%   BMI 31.02 kg/m   General:   Alert,  Well-developed, well-nourished, pleasant and cooperative in NAD Lungs:  Clear throughout to auscultation.   Heart:  Regular rate and rhythm; no murmurs, clicks, rubs,  or gallops. Tender LUQ ribs Abdomen:  Soft, nontender and nondistended. Normal bowel sounds.   Neuro/Psych:  Alert and cooperative. Normal mood and affect. A and O x 3   @Demarion Pondexter  Tammie Fall, MD, [facility] [phone] (pager) 02/23/2024 3:32 PM@

## 2024-02-23 NOTE — Progress Notes (Signed)
 Vss nad trans to pacu

## 2024-02-23 NOTE — Op Note (Signed)
 [facility] Patient Name: Rachel Ochoa Procedure Date: 02/23/2024 3:23 PM MRN: [id number] Endoscopist: Kenney Peacemaker , MD, [id number] Age: 53 Referring MD:  Date of Birth: Dec 23, 1970 Gender: Female Account #: [id number] Procedure:                Colonoscopy Indications:              Screening for colorectal malignant neoplasm, This                            is the patient's first colonoscopy Medicines:                Monitored Anesthesia Care Procedure:                Pre-Anesthesia Assessment:                           - Prior to the procedure, a History and Physical                            was performed, and patient medications and                            allergies were reviewed. The patient's tolerance of                            previous anesthesia was also reviewed. The risks                            and benefits of the procedure and the sedation                            options and risks were discussed with the patient.                            All questions were answered, and informed consent                            was obtained. Prior Anticoagulants: The patient has                            taken no anticoagulant or antiplatelet agents. ASA                            Grade Assessment: II - A patient with mild systemic                            disease. After reviewing the risks and benefits,                            the patient was deemed in satisfactory condition to                            undergo the procedure.  After obtaining informed consent, the colonoscope                            was passed under direct vision. Throughout the                            procedure, the patient's blood pressure, pulse, and                            oxygen saturations were monitored continuously. The                            [id number]-HQ190L Colonoscope [id number] was introduced                            through the anus and  advanced to the the cecum,                            identified by appendiceal orifice and ileocecal                            valve. The colonoscopy was performed without                            difficulty. The patient tolerated the procedure                            well. The quality of the bowel preparation was                            good. The ileocecal valve, appendiceal orifice, and                            rectum were photographed. The bowel preparation                            used was SUFLAVE  via split dose instruction. Scope In: 3:39:32 PM Scope Out: 3:54:37 PM Scope Withdrawal Time: 0 hours 12 minutes 4 seconds  Total Procedure Duration: 0 hours 15 minutes 5 seconds  Findings:                 The perianal and digital rectal examinations were                            normal.                           Internal hemorrhoids were found.                           The exam was otherwise without abnormality on                            direct and retroflexion views. Complications:            No immediate complications.  Estimated Blood Loss:     Estimated blood loss: none. Impression:               - Internal hemorrhoids.                           - The examination was otherwise normal on direct                            and retroflexion views.                           - No specimens collected. Recommendation:           - Patient has a contact number available for                            emergencies. The signs and symptoms of potential                            delayed complications were discussed with the                            patient. Return to normal activities tomorrow.                            Written discharge instructions were provided to the                            patient.                           - Resume previous diet.                           - Continue present medications.                           - Repeat colonoscopy in 10 years for  screening                            purposes. Kenney Peacemaker, MD 02/23/2024 4:02:20 PM This report has been signed electronically.

## 2024-02-26 ENCOUNTER — Telehealth: Payer: Self-pay

## 2024-02-26 NOTE — Telephone Encounter (Signed)
 Left message on follow up call.

## 2024-04-19 ENCOUNTER — Encounter: Payer: Self-pay | Admitting: Internal Medicine

## 2024-05-14 ENCOUNTER — Encounter: Payer: Self-pay | Admitting: Internal Medicine
